# Patient Record
Sex: Female | Born: 1990 | Race: White | Hispanic: No | Marital: Married | Smoking: Never smoker
Health system: Southern US, Community
[De-identification: ages and names within clinical notes are randomized; demographics above are authoritative.]

## PROBLEM LIST (undated history)

## (undated) DIAGNOSIS — T8859XA Other complications of anesthesia, initial encounter: Secondary | ICD-10-CM

## (undated) DIAGNOSIS — Z789 Other specified health status: Secondary | ICD-10-CM

## (undated) HISTORY — DX: Other complications of anesthesia, initial encounter: T88.59XA

## (undated) HISTORY — PX: WISDOM TOOTH EXTRACTION: SHX21

## (undated) HISTORY — PX: ANTERIOR CRUCIATE LIGAMENT REPAIR: SHX115

## (undated) HISTORY — PX: APPENDECTOMY: SHX54

---

## 2016-11-20 ENCOUNTER — Ambulatory Visit (INDEPENDENT_AMBULATORY_CARE_PROVIDER_SITE_OTHER): Payer: Commercial Managed Care - PPO | Admitting: Certified Nurse Midwife

## 2016-11-20 ENCOUNTER — Encounter: Payer: Self-pay | Admitting: Certified Nurse Midwife

## 2016-11-20 VITALS — BP 127/81 | HR 95 | Ht 69.0 in | Wt 169.6 lb

## 2016-11-20 DIAGNOSIS — Z9189 Other specified personal risk factors, not elsewhere classified: Secondary | ICD-10-CM | POA: Insufficient documentation

## 2016-11-20 DIAGNOSIS — Z01419 Encounter for gynecological examination (general) (routine) without abnormal findings: Secondary | ICD-10-CM | POA: Diagnosis not present

## 2016-11-20 DIAGNOSIS — Z915 Personal history of self-harm: Secondary | ICD-10-CM | POA: Diagnosis not present

## 2016-11-20 DIAGNOSIS — IMO0002 Reserved for concepts with insufficient information to code with codable children: Secondary | ICD-10-CM

## 2016-11-20 DIAGNOSIS — Z8659 Personal history of other mental and behavioral disorders: Secondary | ICD-10-CM

## 2016-11-20 MED ORDER — CLONAZEPAM 0.25 MG PO TBDP: 0.2500 mg | ORAL_TABLET | Freq: Two times a day (BID) | ORAL | 0 refills | Status: DC

## 2016-11-20 MED ORDER — SERTRALINE HCL 50 MG PO TABS: 50.0000 mg | ORAL_TABLET | Freq: Every day | ORAL | 1 refills | Status: DC

## 2016-11-20 NOTE — Progress Notes (Signed)
ANNUAL PREVENTATIVE CARE GYN  ENCOUNTER NOTE  Subjective:       Regina Gray, "Regina Gray" is a 26 y.o. [id number] female here for a routine annual gynecologic exam.  Current complaints: increased panic attacks and change in mood.   Denies difficulty breathing or respiratory distress, chest pain, abdominal pain, unexplained vaginal bleeding, dysuria, and leg pain or swelling.   Denies suicidal or homicidal ideations.   Currently, unemployed. 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  Husband works for [employer] [place]. One cat, Regina Gray, and dog, Regina Gray.    Gynecologic History  Patient's last menstrual period was 11/11/2016.  Contraception: IUD, Skyla placed 12/2015  Last Pap: unsure. Results were: normal  Menstrual History  Period Cycle (Days): 30 Period Duration (Days): 10 Period Pattern: Regular Menstrual Flow: Light Menstrual Control: Other (Comment) (Thinx/Diva Cup) Dysmenorrhea: None   Obstetric History OB History  Gravida Para Term Preterm AB Living  1       1    SAB TAB Ectopic Multiple Live Births  1            # Outcome Date GA Lbr Len/2nd Weight Sex Delivery Anes PTL Lv  1 SAB 2014              History reviewed. No pertinent past medical history.  Past Surgical History:  Procedure Laterality Date  . ANTERIOR CRUCIATE LIGAMENT REPAIR    . APPENDECTOMY     Allergies  Allergen Reactions  . Codeine   . Sulfa Antibiotics     Social History   Social History  . Marital status: Married    Spouse name: N/A  . Number of children: N/A  . Years of education: N/A   Occupational History  . Not on file.   Social History Main Topics  . Smoking status: Never Smoker  . Smokeless tobacco: Never Used  . Alcohol use Yes     Comment: occas  . Drug use: No  . Sexual activity: Yes    Birth control/ protection: IUD     Comment: skyla   Other Topics Concern  . Not on file   Social History Narrative  . No narrative on file    Family History   Problem Relation Age of Onset  . Heart attack Maternal Grandfather     The following portions of the patient's history were reviewed and updated as appropriate: allergies, current medications, past family history, past medical history, past social history, past surgical history and problem list.  Review of Systems  ROS negative except as noted above. Information obtained from patient.    Objective:   BP 127/81   Pulse 95   Ht 5\' 9"  (1.753 m)   Wt 169 lb 9.6 oz (76.9 kg)   LMP 11/11/2016   BMI 25.05 kg/m   CONSTITUTIONAL: Well-developed, well-nourished female in no acute distress.   PSYCHIATRIC: Normal mood and affect. Normal behavior. Normal judgment and thought content.  NEUROLGIC: Alert and oriented to person, place, and time. Normal muscle tone coordination. No cranial nerve deficit noted.  HENT:  Normocephalic, atraumatic, External right and left ear normal. Oropharynx is clear and moist  EYES: Conjunctivae and EOM are normal. Pupils are equal, round, and reactive to light. No scleral icterus.   NECK: Normal range of motion, supple, no masses.  Normal thyroid.   SKIN: Skin is warm and dry. No rash noted. Not diaphoretic. No erythema. No pallor.  CARDIOVASCULAR: Normal heart rate noted, regular rhythm, no murmur.  RESPIRATORY: Clear  to auscultation bilaterally. Effort and breath sounds normal, no problems with respiration noted.  BREASTS: Symmetric in size. No masses, skin changes, nipple drainage, or lymphadenopathy.  ABDOMEN: Soft, normal bowel sounds, no distention noted.  No tenderness, rebound or guarding.   PELVIC:  External Genitalia: Normal  Vagina: Normal  Cervix: Normal  Uterus: Normal  Adnexa: Normal   MUSCULOSKELETAL: Normal range of motion. No tenderness.  No cyanosis, clubbing, or edema.  2+ distal pulses.  LYMPHATIC: No Axillary, Supraclavicular, or Inguinal Adenopathy.  Depression screen PHQ 2/9 11/20/2016  Decreased Interest 3  Down,  Depressed, Hopeless 3  PHQ - 2 Score 6  Altered sleeping 3  Tired, decreased energy 3  Change in appetite 3  Feeling bad or failure about yourself  3  Trouble concentrating 1  Moving slowly or fidgety/restless 1  Suicidal thoughts 0  PHQ-9 Score 20    Assessment:   Annual gynecologic examination 26 y.o.   Contraception: IUD   Overweight   Problem List Items Addressed This Visit      Other   History of anxiety   History of depression   History of intentional self-harm    Other Visit Diagnoses    Encounter for well woman exam with routine gynecological exam    -  Primary      Plan:   Pap: Pap, Reflex if ASCUS  Labs: Declined.   Rx: Zoloft and Klonopin, see orders.   Information given for Regina Gray, MSW, LCSW and encouraged to contact.   Routine preventative health maintenance measures emphasized: Exercise/Diet/Weight control, Tobacco Warnings, Alcohol/Substance use risks and Stress Management.   Reviewed red flag symptoms and when to call.   RTC x 4-6 weeks for medication check or sooner if needed.   Return to Clinic - 1 Year    Gunnar BullaJenkins Michelle Marvelous [place], CNM

## 2016-11-20 NOTE — Patient Instructions (Addendum)
Preventive Care 18-39 Years, Female Preventive care refers to lifestyle choices and visits with your health care provider that can promote health and wellness. What does preventive care include?  A yearly physical exam. This is also called an annual well check.  Dental exams once or twice a year.  Routine eye exams. Ask your health care provider how often you should have your eyes checked.  Personal lifestyle choices, including: ? Daily care of your teeth and gums. ? Regular physical activity. ? Eating a healthy diet. ? Avoiding tobacco and drug use. ? Limiting alcohol use. ? Practicing safe sex. ? Taking vitamin and mineral supplements as recommended by your health care provider. What happens during an annual well check? The services and screenings done by your health care provider during your annual well check will depend on your age, overall health, lifestyle risk factors, and family history of disease. Counseling Your health care provider may ask you questions about your:  Alcohol use.  Tobacco use.  Drug use.  Emotional well-being.  Home and relationship well-being.  Sexual activity.  Eating habits.  Work and work Statistician.  Method of birth control.  Menstrual cycle.  Pregnancy history.  Screening You may have the following tests or measurements:  Height, weight, and BMI.  Diabetes screening. This is done by checking your blood sugar (glucose) after you have not eaten for a while (fasting).  Blood pressure.  Lipid and cholesterol levels. These may be checked every 5 years starting at age 66.  Skin check.  Hepatitis C blood test.  Hepatitis B blood test.  Sexually transmitted disease (STD) testing.  BRCA-related cancer screening. This may be done if you have a family history of breast, ovarian, tubal, or peritoneal cancers.  Pelvic exam and Pap test. This may be done every 3 years starting at age 40. Starting at age 59, this may be done every 5  years if you have a Pap test in combination with an HPV test.  Discuss your test results, treatment options, and if necessary, the need for more tests with your health care provider. Vaccines Your health care provider may recommend certain vaccines, such as:  Influenza vaccine. This is recommended every year.  Tetanus, diphtheria, and acellular pertussis (Tdap, Td) vaccine. You may need a Td booster every 10 years.  Varicella vaccine. You may need this if you have not been vaccinated.  HPV vaccine. If you are 69 or younger, you may need three doses over 6 months.  Measles, mumps, and rubella (MMR) vaccine. You may need at least one dose of MMR. You may also need a second dose.  Pneumococcal 13-valent conjugate (PCV13) vaccine. You may need this if you have certain conditions and were not previously vaccinated.  Pneumococcal polysaccharide (PPSV23) vaccine. You may need one or two doses if you smoke cigarettes or if you have certain conditions.  Meningococcal vaccine. One dose is recommended if you are age 27-21 years and a first-year college student living in a residence hall, or if you have one of several medical conditions. You may also need additional booster doses.  Hepatitis A vaccine. You may need this if you have certain conditions or if you travel or work in places where you may be exposed to hepatitis A.  Hepatitis B vaccine. You may need this if you have certain conditions or if you travel or work in places where you may be exposed to hepatitis B.  Haemophilus influenzae type b (Hib) vaccine. You may need this if  you have certain risk factors.  Talk to your health care provider about which screenings and vaccines you need and how often you need them. This information is not intended to replace advice given to you by your health care provider. Make sure you discuss any questions you have with your health care provider. Document Released: 06/06/2001 Document Revised: 12/29/2015  Document Reviewed: 02/09/2015 Elsevier Interactive Patient Education  2017 [employer]. Panic Attacks Panic attacks are sudden, short-livedsurges of severe anxiety, fear, or discomfort. They may occur for no reason when you are relaxed, when you are anxious, or when you are sleeping. Panic attacks may occur for a number of reasons:  Healthy people occasionally have panic attacks in extreme, life-threatening situations, such as war or natural disasters. Normal anxiety is a protective mechanism of the body that helps [place] react to danger (fight or flight response).  Panic attacks are often seen with anxiety disorders, such as panic disorder, social anxiety disorder, generalized anxiety disorder, and phobias. Anxiety disorders cause excessive or uncontrollable anxiety. They may interfere with your relationships or other life activities.  Panic attacks are sometimes seen with other mental illnesses, such as depression and posttraumatic stress disorder.  Certain medical conditions, prescription medicines, and drugs of abuse can cause panic attacks.  What are the signs or symptoms? Panic attacks start suddenly, peak within 20 minutes, and are accompanied by four or more of the following symptoms:  Pounding heart or fast heart rate (palpitations).  Sweating.  Trembling or shaking.  Shortness of breath or feeling smothered.  Feeling choked.  Chest pain or discomfort.  Nausea or strange feeling in your stomach.  Dizziness, light-headedness, or feeling like you will faint.  Chills or hot flushes.  Numbness or tingling in your lips or hands and feet.  Feeling that things are not real or feeling that you are not yourself.  Fear of losing control or going crazy.  Fear of dying.  Some of these symptoms can mimic serious medical conditions. For example, you may think you are having a heart attack. Although panic attacks can be very scary, they are not life threatening. How is this  diagnosed? Panic attacks are diagnosed through an assessment by your health care provider. Your health care provider will ask questions about your symptoms, such as where and when they occurred. Your health care provider will also ask about your medical history and use of alcohol and drugs, including prescription medicines. Your health care provider may order blood tests or other studies to rule out a serious medical condition. Your health care provider may refer you to a mental health professional for further evaluation. How is this treated?  Most healthy people who have one or two panic attacks in an extreme, life-threatening situation will not require treatment.  The treatment for panic attacks associated with anxiety disorders or other mental illness typically involves counseling with a mental health professional, medicine, or a combination of both. Your health care provider will help determine what treatment is best for you.  Panic attacks due to physical illness usually go away with treatment of the illness. If prescription medicine is causing panic attacks, talk with your health care provider about stopping the medicine, decreasing the dose, or substituting another medicine.  Panic attacks due to alcohol or drug abuse go away with abstinence. Some adults need professional help in order to stop drinking or using drugs. Follow these instructions at home:  Take all medicines as directed by your health care provider.  Schedule  and attend follow-up visits as directed by your health care provider. It is important to keep all your appointments. Contact a health care provider if:  You are not able to take your medicines as prescribed.  Your symptoms do not improve or get worse. Get help right away if:  You experience panic attack symptoms that are different than your usual symptoms.  You have serious thoughts about hurting yourself or others.  You are taking medicine for panic attacks and  have a serious side effect. This information is not intended to replace advice given to you by your health care provider. Make sure you discuss any questions you have with your health care provider. Document Released: 04/10/2005 Document Revised: 09/16/2015 Document Reviewed: 11/22/2012 Elsevier Interactive Patient Education  2017 [employer]. Clonazepam tablets What is this medicine? CLONAZEPAM (kloe NA ze pam) is a benzodiazepine. It is used to treat certain types of seizures. It is also used to treat panic disorder. This medicine may be used for other purposes; ask your health care provider or pharmacist if you have questions. COMMON BRAND NAME(S): Ceberclon, Klonopin What should I tell my health care provider before I take this medicine? They need to know if you have any of these conditions: -an alcohol or drug abuse problem -bipolar disorder, depression, psychosis or other mental health condition -glaucoma -kidney or liver disease -lung or breathing disease -myasthenia gravis -Parkinson's disease -porphyria -seizures or a history of seizures -suicidal thoughts -an unusual or allergic reaction to clonazepam, other benzodiazepines, foods, dyes, or preservatives -pregnant or trying to get pregnant -breast-feeding How should I use this medicine? Take this medicine by mouth with a glass of water. Follow the directions on the prescription label. If it upsets your stomach, take it with food or milk. Take your medicine at regular intervals. Do not take it more often than directed. Do not stop taking or change the dose except on the advice of your doctor or health care professional. A special MedGuide will be given to you by the pharmacist with each prescription and refill. Be sure to read this information carefully each time. Talk to your pediatrician regarding the use of this medicine in children. Special care may be needed. Overdosage: If you think you have taken too much of this  medicine contact a poison control center or emergency room at once. NOTE: This medicine is only for you. Do not share this medicine with others. What if I miss a dose? If you miss a dose, take it as soon as you can. If it is almost time for your next dose, take only that dose. Do not take double or extra doses. What may interact with this medicine? Do not take this medication with any of the following medicines: -narcotic medicines for cough -sodium oxybate This medicine may also interact with the following medications: -alcohol -antihistamines for allergy, cough and cold -antiviral medicines for HIV or AIDS -certain medicines for anxiety or sleep -certain medicines for depression, like amitriptyline, fluoxetine, sertraline -certain medicines for fungal infections like ketoconazole and itraconazole -certain medicines for seizures like carbamazepine, phenobarbital, phenytoin, primidone -general anesthetics like halothane, isoflurane, methoxyflurane, propofol -local anesthetics like lidocaine, pramoxine, tetracaine -medicines that relax muscles for surgery -narcotic medicines for pain -phenothiazines like chlorpromazine, mesoridazine, prochlorperazine, thioridazine This list may not describe all possible interactions. Give your health care provider a list of all the medicines, herbs, non-prescription drugs, or dietary supplements you use. Also tell them if you smoke, drink alcohol, or use illegal drugs. Some  items may interact with your medicine. What should I watch for while using this medicine? Tell your doctor or health care professional if your symptoms do not start to get better or if they get worse. Do not stop taking except on your doctor's advice. You may develop a severe reaction. Your doctor will tell you how much medicine to take. You may get drowsy or dizzy. Do not drive, use machinery, or do anything that needs mental alertness until you know how this medicine affects you. To  reduce the risk of dizzy and fainting spells, do not stand or sit up quickly, especially if you are an older patient. Alcohol may increase dizziness and drowsiness. Avoid alcoholic drinks. If you are taking another medicine that also causes drowsiness, you may have more side effects. Give your health care provider a list of all medicines you use. Your doctor will tell you how much medicine to take. Do not take more medicine than directed. Call emergency for help if you have problems breathing or unusual sleepiness. The use of this medicine may increase the chance of suicidal thoughts or actions. Pay special attention to how you are responding while on this medicine. Any worsening of mood, or thoughts of suicide or dying should be reported to your health care professional right away. What side effects may I notice from receiving this medicine? Side effects that you should report to your doctor or health care professional as soon as possible: -allergic reactions like skin rash, itching or hives, swelling of the face, lips, or tongue -breathing problems -confusion -loss of balance or coordination -signs and symptoms of low blood pressure like dizziness; feeling faint or lightheaded, falls; unusually weak or tired -suicidal thoughts or mood changes Side effects that usually do not require medical attention (report to your doctor or health care professional if they continue or are bothersome): -dizziness -headache -tiredness -upset stomach This list may not describe all possible side effects. Call your doctor for medical advice about side effects. You may report side effects to FDA at 1-800-FDA-1088. Where should I keep my medicine? Keep out of the reach of children. This medicine can be abused. Keep your medicine in a safe place to protect it from theft. Do not share this medicine with anyone. Selling or giving away this medicine is dangerous and against the law. This medicine may cause accidental  overdose and death if taken by other adults, children, or pets. Mix any unused medicine with a substance like cat litter or coffee grounds. Then throw the medicine away in a sealed container like a sealed bag or a coffee can with a lid. Do not use the medicine after the expiration date. Store at room temperature between 15 and 30 degrees C (59 and 86 degrees F). Protect from light. Keep container tightly closed. NOTE: This sheet is a summary. It may not cover all possible information. If you have questions about this medicine, talk to your doctor, pharmacist, or health care provider.  2018 Elsevier/Gold Standard (2015-09-17 18:46:32) Sertraline tablets What is this medicine? SERTRALINE (SER tra leen) is used to treat depression. It may also be used to treat obsessive compulsive disorder, panic disorder, post-trauma stress, premenstrual dysphoric disorder (PMDD) or social anxiety. This medicine may be used for other purposes; ask your health care provider or pharmacist if you have questions. COMMON BRAND NAME(S): Zoloft What should I tell my health care provider before I take this medicine? They need to know if you have any of these conditions: -bleeding  disorders -bipolar disorder or a family history of bipolar disorder -glaucoma -heart disease -high blood pressure -history of irregular heartbeat -history of low levels of calcium, magnesium, or potassium in the blood -if you often drink alcohol -liver disease -receiving electroconvulsive therapy -seizures -suicidal thoughts, plans, or attempt; a previous suicide attempt by you or a family member -take medicines that treat or prevent blood clots -thyroid disease -an unusual or allergic reaction to sertraline, other medicines, foods, dyes, or preservatives -pregnant or trying to get pregnant -breast-feeding How should I use this medicine? Take this medicine by mouth with a glass of water. Follow the directions on the prescription label.  You can take it with or without food. Take your medicine at regular intervals. Do not take your medicine more often than directed. Do not stop taking this medicine suddenly except upon the advice of your doctor. Stopping this medicine too quickly may cause serious side effects or your condition may worsen. A special MedGuide will be given to you by the pharmacist with each prescription and refill. Be sure to read this information carefully each time. Talk to your pediatrician regarding the use of this medicine in children. While this drug may be prescribed for children as young as 7 years for selected conditions, precautions do apply. Overdosage: If you think you have taken too much of this medicine contact a poison control center or emergency room at once. NOTE: This medicine is only for you. Do not share this medicine with others. What if I miss a dose? If you miss a dose, take it as soon as you can. If it is almost time for your next dose, take only that dose. Do not take double or extra doses. What may interact with this medicine? Do not take this medicine with any of the following medications: -cisapride -dofetilide -dronedarone -linezolid -MAOIs like Carbex, Eldepryl, Marplan, Nardil, and Parnate -methylene blue (injected into a vein) -pimozide -thioridazine This medicine may also interact with the following medications: -alcohol -amphetamines -aspirin and aspirin-like medicines -certain medicines for depression, anxiety, or psychotic disturbances -certain medicines for fungal infections like ketoconazole, fluconazole, posaconazole, and itraconazole -certain medicines for irregular heart beat like flecainide, quinidine, propafenone -certain medicines for migraine headaches like almotriptan, eletriptan, frovatriptan, naratriptan, rizatriptan, sumatriptan, zolmitriptan -certain medicines for sleep -certain medicines for seizures like carbamazepine, valproic acid, phenytoin -certain  medicines that treat or prevent blood clots like warfarin, enoxaparin, dalteparin -cimetidine -digoxin -diuretics -fentanyl -isoniazid -lithium -NSAIDs, medicines for pain and inflammation, like ibuprofen or naproxen -other medicines that prolong the QT interval (cause an abnormal heart rhythm) -rasagiline -safinamide -supplements like St. John's wort, kava kava, valerian -tolbutamide -tramadol -tryptophan This list may not describe all possible interactions. Give your health care provider a list of all the medicines, herbs, non-prescription drugs, or dietary supplements you use. Also tell them if you smoke, drink alcohol, or use illegal drugs. Some items may interact with your medicine. What should I watch for while using this medicine? Tell your doctor if your symptoms do not get better or if they get worse. Visit your doctor or health care professional for regular checks on your progress. Because it may take several weeks to see the full effects of this medicine, it is important to continue your treatment as prescribed by your doctor. Patients and their families should watch out for new or worsening thoughts of suicide or depression. Also watch out for sudden changes in feelings such as feeling anxious, agitated, panicky, irritable, hostile, aggressive, impulsive, severely restless,  overly excited and hyperactive, or not being able to sleep. If this happens, especially at the beginning of treatment or after a change in dose, call your health care professional. Dennis Bast may get drowsy or dizzy. Do not drive, use machinery, or do anything that needs mental alertness until you know how this medicine affects you. Do not stand or sit up quickly, especially if you are an older patient. This reduces the risk of dizzy or fainting spells. Alcohol may interfere with the effect of this medicine. Avoid alcoholic drinks. Your mouth may get dry. Chewing sugarless gum or sucking hard candy, and drinking plenty of  water may help. Contact your doctor if the problem does not go away or is severe. What side effects may I notice from receiving this medicine? Side effects that you should report to your doctor or health care professional as soon as possible: -allergic reactions like skin rash, itching or hives, swelling of the face, lips, or tongue -anxious -black, tarry stools -changes in vision -confusion -elevated mood, decreased need for sleep, racing thoughts, impulsive behavior -eye pain -fast, irregular heartbeat -feeling faint or lightheaded, falls -feeling agitated, angry, or irritable -hallucination, loss of contact with reality -loss of balance or coordination -loss of memory -painful or prolonged erections -restlessness, pacing, inability to keep still -seizures -stiff muscles -suicidal thoughts or other mood changes -trouble sleeping -unusual bleeding or bruising -unusually weak or tired -vomiting Side effects that usually do not require medical attention (report to your doctor or health care professional if they continue or are bothersome): -change in appetite or weight -change in sex drive or performance -diarrhea -increased sweating -indigestion, nausea -tremors This list may not describe all possible side effects. Call your doctor for medical advice about side effects. You may report side effects to FDA at 1-800-FDA-1088. Where should I keep my medicine? Keep out of the reach of children. Store at room temperature between 15 and 30 degrees C (59 and 86 degrees F). Throw away any unused medicine after the expiration date. NOTE: This sheet is a summary. It may not cover all possible information. If you have questions about this medicine, talk to your doctor, pharmacist, or health care provider.  2018 Elsevier/Gold Standard (2016-04-14 14:17:49)

## 2016-11-23 LAB — PAP IG W/ RFLX HPV ASCU: PAP Smear Comment: 0

## 2017-01-01 ENCOUNTER — Encounter: Payer: Commercial Managed Care - PPO | Admitting: Certified Nurse Midwife

## 2017-01-08 ENCOUNTER — Ambulatory Visit (INDEPENDENT_AMBULATORY_CARE_PROVIDER_SITE_OTHER): Payer: Commercial Managed Care - PPO | Admitting: Certified Nurse Midwife

## 2017-01-08 ENCOUNTER — Encounter: Payer: Self-pay | Admitting: Certified Nurse Midwife

## 2017-01-08 VITALS — BP 122/80 | HR 63 | Ht 69.0 in | Wt 169.0 lb

## 2017-01-08 DIAGNOSIS — Z09 Encounter for follow-up examination after completed treatment for conditions other than malignant neoplasm: Secondary | ICD-10-CM

## 2017-01-08 DIAGNOSIS — Z79899 Other long term (current) drug therapy: Secondary | ICD-10-CM | POA: Diagnosis not present

## 2017-01-08 MED ORDER — SERTRALINE HCL 50 MG PO TABS: 50.0000 mg | ORAL_TABLET | Freq: Every day | ORAL | 5 refills | Status: DC

## 2017-01-08 MED ORDER — CLONAZEPAM 0.25 MG PO TBDP: 0.2500 mg | ORAL_TABLET | Freq: Two times a day (BID) | ORAL | 1 refills | Status: DC

## 2017-01-08 NOTE — Patient Instructions (Signed)
Clonazepam tablets What is this medicine? CLONAZEPAM (kloe NA ze pam) is a benzodiazepine. It is used to treat certain types of seizures. It is also used to treat panic disorder. This medicine may be used for other purposes; ask your health care provider or pharmacist if you have questions. COMMON BRAND NAME(S): Ceberclon, Klonopin What should I tell my health care provider before I take this medicine? They need to know if you have any of these conditions: -an alcohol or drug abuse problem -bipolar disorder, depression, psychosis or other mental health condition -glaucoma -kidney or liver disease -lung or breathing disease -myasthenia gravis -Parkinson's disease -porphyria -seizures or a history of seizures -suicidal thoughts -an unusual or allergic reaction to clonazepam, other benzodiazepines, foods, dyes, or preservatives -pregnant or trying to get pregnant -breast-feeding How should I use this medicine? Take this medicine by mouth with a glass of water. Follow the directions on the prescription label. If it upsets your stomach, take it with food or milk. Take your medicine at regular intervals. Do not take it more often than directed. Do not stop taking or change the dose except on the advice of your doctor or health care professional. A special MedGuide will be given to you by the pharmacist with each prescription and refill. Be sure to read this information carefully each time. Talk to your pediatrician regarding the use of this medicine in children. Special care may be needed. Overdosage: If you think you have taken too much of this medicine contact a poison control center or emergency room at once. NOTE: This medicine is only for you. Do not share this medicine with others. What if I miss a dose? If you miss a dose, take it as soon as you can. If it is almost time for your next dose, take only that dose. Do not take double or extra doses. What may interact with this medicine? Do  not take this medication with any of the following medicines: -narcotic medicines for cough -sodium oxybate This medicine may also interact with the following medications: -alcohol -antihistamines for allergy, cough and cold -antiviral medicines for HIV or AIDS -certain medicines for anxiety or sleep -certain medicines for depression, like amitriptyline, fluoxetine, sertraline -certain medicines for fungal infections like ketoconazole and itraconazole -certain medicines for seizures like carbamazepine, phenobarbital, phenytoin, primidone -general anesthetics like halothane, isoflurane, methoxyflurane, propofol -local anesthetics like lidocaine, pramoxine, tetracaine -medicines that relax muscles for surgery -narcotic medicines for pain -phenothiazines like chlorpromazine, mesoridazine, prochlorperazine, thioridazine This list may not describe all possible interactions. Give your health care provider a list of all the medicines, herbs, non-prescription drugs, or dietary supplements you use. Also tell them if you smoke, drink alcohol, or use illegal drugs. Some items may interact with your medicine. What should I watch for while using this medicine? Tell your doctor or health care professional if your symptoms do not start to get better or if they get worse. Do not stop taking except on your doctor's advice. You may develop a severe reaction. Your doctor will tell you how much medicine to take. You may get drowsy or dizzy. Do not drive, use machinery, or do anything that needs mental alertness until you know how this medicine affects you. To reduce the risk of dizzy and fainting spells, do not stand or sit up quickly, especially if you are an older patient. Alcohol may increase dizziness and drowsiness. Avoid alcoholic drinks. If you are taking another medicine that also causes drowsiness, you may have more side   effects. Give your health care provider a list of all medicines you use. Your doctor  will tell you how much medicine to take. Do not take more medicine than directed. Call emergency for help if you have problems breathing or unusual sleepiness. The use of this medicine may increase the chance of suicidal thoughts or actions. Pay special attention to how you are responding while on this medicine. Any worsening of mood, or thoughts of suicide or dying should be reported to your health care professional right away. What side effects may I notice from receiving this medicine? Side effects that you should report to your doctor or health care professional as soon as possible: -allergic reactions like skin rash, itching or hives, swelling of the face, lips, or tongue -breathing problems -confusion -loss of balance or coordination -signs and symptoms of low blood pressure like dizziness; feeling faint or lightheaded, falls; unusually weak or tired -suicidal thoughts or mood changes Side effects that usually do not require medical attention (report to your doctor or health care professional if they continue or are bothersome): -dizziness -headache -tiredness -upset stomach This list may not describe all possible side effects. Call your doctor for medical advice about side effects. You may report side effects to FDA at 1-800-FDA-1088. Where should I keep my medicine? Keep out of the reach of children. This medicine can be abused. Keep your medicine in a safe place to protect it from theft. Do not share this medicine with anyone. Selling or giving away this medicine is dangerous and against the law. This medicine may cause accidental overdose and death if taken by other adults, children, or pets. Mix any unused medicine with a substance like cat litter or coffee grounds. Then throw the medicine away in a sealed container like a sealed bag or a coffee can with a lid. Do not use the medicine after the expiration date. Store at room temperature between 15 and 30 degrees C (59 and 86 degrees F).  Protect from light. Keep container tightly closed. NOTE: This sheet is a summary. It may not cover all possible information. If you have questions about this medicine, talk to your doctor, pharmacist, or health care provider.  2018 Elsevier/Gold Standard (2015-09-17 18:46:32) Sertraline tablets What is this medicine? SERTRALINE (SER tra leen) is used to treat depression. It may also be used to treat obsessive compulsive disorder, panic disorder, post-trauma stress, premenstrual dysphoric disorder (PMDD) or social anxiety. This medicine may be used for other purposes; ask your health care provider or pharmacist if you have questions. COMMON BRAND NAME(S): Zoloft What should I tell my health care provider before I take this medicine? They need to know if you have any of these conditions: -bleeding disorders -bipolar disorder or a family history of bipolar disorder -glaucoma -heart disease -high blood pressure -history of irregular heartbeat -history of low levels of calcium, magnesium, or potassium in the blood -if you often drink alcohol -liver disease -receiving electroconvulsive therapy -seizures -suicidal thoughts, plans, or attempt; a previous suicide attempt by you or a family member -take medicines that treat or prevent blood clots -thyroid disease -an unusual or allergic reaction to sertraline, other medicines, foods, dyes, or preservatives -pregnant or trying to get pregnant -breast-feeding How should I use this medicine? Take this medicine by mouth with a glass of water. Follow the directions on the prescription label. You can take it with or without food. Take your medicine at regular intervals. Do not take your medicine more often than directed.  Do not stop taking this medicine suddenly except upon the advice of your doctor. Stopping this medicine too quickly may cause serious side effects or your condition may worsen. A special MedGuide will be given to you by the pharmacist  with each prescription and refill. Be sure to read this information carefully each time. Talk to your pediatrician regarding the use of this medicine in children. While this drug may be prescribed for children as young as 7 years for selected conditions, precautions do apply. Overdosage: If you think you have taken too much of this medicine contact a poison control center or emergency room at once. NOTE: This medicine is only for you. Do not share this medicine with others. What if I miss a dose? If you miss a dose, take it as soon as you can. If it is almost time for your next dose, take only that dose. Do not take double or extra doses. What may interact with this medicine? Do not take this medicine with any of the following medications: -cisapride -dofetilide -dronedarone -linezolid -MAOIs like Carbex, Eldepryl, Marplan, Nardil, and Parnate -methylene blue (injected into a vein) -pimozide -thioridazine This medicine may also interact with the following medications: -alcohol -amphetamines -aspirin and aspirin-like medicines -certain medicines for depression, anxiety, or psychotic disturbances -certain medicines for fungal infections like ketoconazole, fluconazole, posaconazole, and itraconazole -certain medicines for irregular heart beat like flecainide, quinidine, propafenone -certain medicines for migraine headaches like almotriptan, eletriptan, frovatriptan, naratriptan, rizatriptan, sumatriptan, zolmitriptan -certain medicines for sleep -certain medicines for seizures like carbamazepine, valproic acid, phenytoin -certain medicines that treat or prevent blood clots like warfarin, enoxaparin, dalteparin -cimetidine -digoxin -diuretics -fentanyl -isoniazid -lithium -NSAIDs, medicines for pain and inflammation, like ibuprofen or naproxen -other medicines that prolong the QT interval (cause an abnormal heart rhythm) -rasagiline -safinamide -supplements like St. John's wort, kava  kava, valerian -tolbutamide -tramadol -tryptophan This list may not describe all possible interactions. Give your health care provider a list of all the medicines, herbs, non-prescription drugs, or dietary supplements you use. Also tell them if you smoke, drink alcohol, or use illegal drugs. Some items may interact with your medicine. What should I watch for while using this medicine? Tell your doctor if your symptoms do not get better or if they get worse. Visit your doctor or health care professional for regular checks on your progress. Because it may take several weeks to see the full effects of this medicine, it is important to continue your treatment as prescribed by your doctor. Patients and their families should watch out for new or worsening thoughts of suicide or depression. Also watch out for sudden changes in feelings such as feeling anxious, agitated, panicky, irritable, hostile, aggressive, impulsive, severely restless, overly excited and hyperactive, or not being able to sleep. If this happens, especially at the beginning of treatment or after a change in dose, call your health care professional. Bonita Quin may get drowsy or dizzy. Do not drive, use machinery, or do anything that needs mental alertness until you know how this medicine affects you. Do not stand or sit up quickly, especially if you are an older patient. This reduces the risk of dizzy or fainting spells. Alcohol may interfere with the effect of this medicine. Avoid alcoholic drinks. Your mouth may get dry. Chewing sugarless gum or sucking hard candy, and drinking plenty of water may help. Contact your doctor if the problem does not go away or is severe. What side effects may I notice from receiving this medicine? Side  effects that you should report to your doctor or health care professional as soon as possible: -allergic reactions like skin rash, itching or hives, swelling of the face, lips, or tongue -anxious -black, tarry  stools -changes in vision -confusion -elevated mood, decreased need for sleep, racing thoughts, impulsive behavior -eye pain -fast, irregular heartbeat -feeling faint or lightheaded, falls -feeling agitated, angry, or irritable -hallucination, loss of contact with reality -loss of balance or coordination -loss of memory -painful or prolonged erections -restlessness, pacing, inability to keep still -seizures -stiff muscles -suicidal thoughts or other mood changes -trouble sleeping -unusual bleeding or bruising -unusually weak or tired -vomiting Side effects that usually do not require medical attention (report to your doctor or health care professional if they continue or are bothersome): -change in appetite or weight -change in sex drive or performance -diarrhea -increased sweating -indigestion, nausea -tremors This list may not describe all possible side effects. Call your doctor for medical advice about side effects. You may report side effects to FDA at 1-800-FDA-1088. Where should I keep my medicine? Keep out of the reach of children. Store at room temperature between 15 and 30 degrees C (59 and 86 degrees F). Throw away any unused medicine after the expiration date. NOTE: This sheet is a summary. It may not cover all possible information. If you have questions about this medicine, talk to your doctor, pharmacist, or health care provider.  2018 Elsevier/Gold Standard (2016-04-14 14:17:49)

## 2017-01-08 NOTE — Progress Notes (Signed)
GYN ENCOUNTER NOTE  Subjective:       Regina Gray is a 26 y.o. [id number] female is here for follow up. Last seen on 11/20/2016 for annual exam and positive for symptoms of major depressive episode. PHQ-9: 20.  Reports feeling much better. Taking Zoloft daily and Klonopin as needed. Took Klonopin for a few nights in a row to sleep, but is not making that a habit.   Has been up around house completing tasks like laundry and cleaning. Recently went out of town with spouse.   Endorses weight gain, breast tenderness, and vaginal spotting after recent knee surgery.   Denies difficulty breathing or respiratory distress, chest pain, abdominal pain, excessive vaginal bleeding, dysuria, and leg pain or swelling.    Gynecologic History  No LMP recorded. Patient is not currently having periods (Reason: IUD).  Contraception: IUD, Christean Grief  Last Pap: 11/20/2016. Results were: normal  Obstetric History  OB History  Gravida Para Term Preterm AB Living  1       1    SAB TAB Ectopic Multiple Live Births  1            # Outcome Date GA Lbr Len/2nd Weight Sex Delivery Anes PTL Lv  1 SAB 2014              History reviewed. No pertinent past medical history.  Past Surgical History:  Procedure Laterality Date  . ANTERIOR CRUCIATE LIGAMENT REPAIR    . APPENDECTOMY      Current Outpatient Prescriptions on File Prior to Visit  Medication Sig Dispense Refill  . Levonorgestrel (SKYLA) 13.5 MG IUD by Intrauterine route.     No current facility-administered medications on file prior to visit.     Allergies  Allergen Reactions  . Codeine   . Sulfa Antibiotics     Social History   Social History  . Marital status: Married    Spouse name: N/A  . Number of children: N/A  . Years of education: N/A   Occupational History  . Not on file.   Social History Main Topics  . Smoking status: Never Smoker  . Smokeless tobacco: Never Used  . Alcohol use Yes     Comment: occas  . Drug  use: No  . Sexual activity: Yes    Birth control/ protection: IUD     Comment: skyla   Other Topics Concern  . Not on file   Social History Narrative  . No narrative on file    Family History  Problem Relation Age of Onset  . Heart attack Maternal Grandfather     The following portions of the patient's history were reviewed and updated as appropriate: allergies, current medications, past family history, past medical history, past social history, past surgical history and problem list.  Review of Systems  Review of Systems - Negative except as noted above.  History obtained from the patient.   Objective:   BP 122/80   Pulse 63   Ht  (1.753 m)   Wt 169 lb (76.7 kg)   BMI 24.96 kg/m   Alert and oriented x 4, no apparent distress.  Depression screen [facility] 2/9 01/08/2017  Decreased Interest 0  Down, Depressed, Hopeless 1  PHQ - 2 Score 1  Altered sleeping 3  Tired, decreased energy 1  Change in appetite 3  Feeling bad or failure about yourself  1  Trouble concentrating 0  Moving slowly or fidgety/restless 0  Suicidal thoughts 0  PHQ-9 Score  9   Assessment:   1. Follow up  2. Medication management  Plan:   Continue medications as prescribed, see orders.   Try Melatonin for sleep as needed.   Encouraged cycle tracking, symptom monitoring, and bra fitting.  Reviewed red flag symptoms and when to call.   RTC x 6 months for follow up or sooner if needed.    Gunnar Bulla, CNM

## 2017-01-19 ENCOUNTER — Encounter: Payer: Self-pay | Admitting: Physician Assistant

## 2017-01-19 ENCOUNTER — Ambulatory Visit (INDEPENDENT_AMBULATORY_CARE_PROVIDER_SITE_OTHER): Payer: Commercial Managed Care - PPO | Admitting: Physician Assistant

## 2017-01-19 VITALS — BP 104/62 | HR 76 | Temp 98.2°F | Resp 16 | Ht 69.0 in | Wt 171.0 lb

## 2017-01-19 DIAGNOSIS — M62838 Other muscle spasm: Secondary | ICD-10-CM | POA: Diagnosis not present

## 2017-01-19 DIAGNOSIS — Z0000 Encounter for general adult medical examination without abnormal findings: Secondary | ICD-10-CM | POA: Diagnosis not present

## 2017-01-19 MED ORDER — CYCLOBENZAPRINE HCL 10 MG PO TABS: 10.0000 mg | ORAL_TABLET | Freq: Every day | ORAL | 0 refills | Status: DC
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
## 2017-01-19 NOTE — Patient Instructions (Signed)

## 2017-02-22 ENCOUNTER — Encounter: Payer: Self-pay | Admitting: Physician Assistant

## 2017-03-05 ENCOUNTER — Encounter: Payer: Self-pay | Admitting: Physician Assistant

## 2017-03-08 ENCOUNTER — Other Ambulatory Visit: Payer: Self-pay | Admitting: Physician Assistant

## 2017-03-08 DIAGNOSIS — M542 Cervicalgia: Secondary | ICD-10-CM
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
## 2017-03-20 ENCOUNTER — Encounter: Payer: Commercial Managed Care - PPO | Admitting: Physical Therapy

## 2017-03-21 ENCOUNTER — Ambulatory Visit: Payer: Commercial Managed Care - PPO | Attending: Physician Assistant | Admitting: Physical Therapy

## 2017-03-21 DIAGNOSIS — G8929 Other chronic pain: Secondary | ICD-10-CM | POA: Diagnosis present

## 2017-03-21 DIAGNOSIS — M25512 Pain in left shoulder: Secondary | ICD-10-CM | POA: Insufficient documentation

## 2017-03-21 DIAGNOSIS — M542 Cervicalgia: Secondary | ICD-10-CM | POA: Diagnosis present

## 2017-03-21 NOTE — Patient Instructions (Signed)
MMT - mild pain reproduced with shoulder abduction on LUE  IR on L - 81 degrees, ER -- 83 degrees
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
## 2017-03-23 ENCOUNTER — Ambulatory Visit: Payer: Commercial Managed Care - PPO | Admitting: Physical Therapy

## 2017-03-23 DIAGNOSIS — M25512 Pain in left shoulder: Principal | ICD-10-CM

## 2017-03-23 DIAGNOSIS — G8929 Other chronic pain: Secondary | ICD-10-CM

## 2017-03-23 DIAGNOSIS — M542 Cervicalgia: Secondary | ICD-10-CM
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
## 2017-03-27 ENCOUNTER — Ambulatory Visit: Payer: Commercial Managed Care - PPO | Admitting: Physical Therapy

## 2017-03-28 ENCOUNTER — Ambulatory Visit: Payer: Commercial Managed Care - PPO | Attending: Physician Assistant | Admitting: Physical Therapy

## 2017-03-28 DIAGNOSIS — M542 Cervicalgia: Secondary | ICD-10-CM | POA: Diagnosis present

## 2017-03-28 DIAGNOSIS — M25512 Pain in left shoulder: Secondary | ICD-10-CM | POA: Diagnosis not present

## 2017-03-28 DIAGNOSIS — G8929 Other chronic pain: Secondary | ICD-10-CM | POA: Insufficient documentation
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
## 2017-04-05 ENCOUNTER — Ambulatory Visit: Payer: Commercial Managed Care - PPO | Admitting: Physical Therapy

## 2017-04-05 DIAGNOSIS — M25512 Pain in left shoulder: Principal | ICD-10-CM

## 2017-04-05 DIAGNOSIS — G8929 Other chronic pain: Secondary | ICD-10-CM

## 2017-04-05 DIAGNOSIS — M542 Cervicalgia: Secondary | ICD-10-CM
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
## 2017-06-05 ENCOUNTER — Encounter: Payer: Self-pay | Admitting: Certified Nurse Midwife

## 2017-06-14 ENCOUNTER — Encounter: Payer: Self-pay | Admitting: Physician Assistant

## 2017-07-09 ENCOUNTER — Encounter: Payer: Commercial Managed Care - PPO | Admitting: Certified Nurse Midwife

## 2017-07-16 ENCOUNTER — Ambulatory Visit (INDEPENDENT_AMBULATORY_CARE_PROVIDER_SITE_OTHER): Payer: Commercial Managed Care - PPO | Admitting: Certified Nurse Midwife

## 2017-07-16 ENCOUNTER — Encounter: Payer: Self-pay | Admitting: Certified Nurse Midwife

## 2017-07-16 VITALS — BP 131/81 | HR 73 | Ht 69.0 in | Wt 172.9 lb

## 2017-07-16 DIAGNOSIS — Z30432 Encounter for removal of intrauterine contraceptive device: Secondary | ICD-10-CM | POA: Diagnosis not present

## 2017-07-16 NOTE — Patient Instructions (Signed)
Lipoma A lipoma is a noncancerous (benign) tumor that is made up of fat cells. This is a very common type of soft-tissue growth. Lipomas are usually found under the skin (subcutaneous). They may occur in any tissue of the body that contains fat. Common areas for lipomas to appear include the back, shoulders, buttocks, and thighs. Lipomas grow slowly, and they are usually painless. Most lipomas do not cause problems and do not require treatment. What are the causes? The cause of this condition is not known. What increases the risk? This condition is more likely to develop in:  People who are 31-27 years old.  People who have a family history of lipomas.  What are the signs or symptoms? A lipoma usually appears as a small, round bump under the skin. It may feel soft or rubbery, but the firmness can vary. Most lipomas are not painful. However, a lipoma may become painful if it is located in an area where it pushes on nerves. How is this diagnosed? A lipoma can usually be diagnosed with a physical exam. You may also have tests to confirm the diagnosis and to rule out other conditions. Tests may include:  Imaging tests, such as a CT scan or MRI.  Removal of a tissue sample to be looked at under a microscope (biopsy).  How is this treated? Treatment is not needed for small lipomas that are not causing problems. If a lipoma continues to get bigger or it causes problems, removal is often the best option. Lipomas can also be removed to improve appearance. Removal of a lipoma is usually done with a surgery in which the fatty cells and the surrounding capsule are removed. Most often, a medicine that numbs the area (local anesthetic) is used for this procedure. Follow these instructions at home:  Keep all follow-up visits as directed by your health care provider. This is important. Contact a health care provider if:  Your lipoma becomes larger or hard.  Your lipoma becomes painful, red, or  increasingly swollen. These could be signs of infection or a more serious condition. This information is not intended to replace advice given to you by your health care provider. Make sure you discuss any questions you have with your health care provider. Document Released: 03/31/2002 Document Revised: 09/16/2015 Document Reviewed: 04/06/2014 Elsevier Interactive Patient Education  2018 [place] 18-39 Years, Female Preventive care refers to lifestyle choices and visits with your health care provider that can promote health and wellness. What does preventive care include?  A yearly physical exam. This is also called an annual well check.  Dental exams once or twice a year.  Routine eye exams. Ask your health care provider how often you should have your eyes checked.  Personal lifestyle choices, including: ? Daily care of your teeth and gums. ? Regular physical activity. ? Eating a healthy diet. ? Avoiding tobacco and drug use. ? Limiting alcohol use. ? Practicing safe sex. ? Taking vitamin and mineral supplements as recommended by your health care provider. What happens during an annual well check? The services and screenings done by your health care provider during your annual well check will depend on your age, overall health, lifestyle risk factors, and family history of disease. Counseling Your health care provider may ask you questions about your:  Alcohol use.  Tobacco use.  Drug use.  Emotional well-being.  Home and relationship well-being.  Sexual activity.  Eating habits.  Work and work Statistician.  Method of birth control.  Menstrual cycle.  Pregnancy history.  Screening You may have the following tests or measurements:  Height, weight, and BMI.  Diabetes screening. This is done by checking your blood sugar (glucose) after you have not eaten for a while (fasting).  Blood pressure.  Lipid and cholesterol levels. These may be  checked every 5 years starting at age 47.  Skin check.  Hepatitis C blood test.  Hepatitis B blood test.  Sexually transmitted disease (STD) testing.  BRCA-related cancer screening. This may be done if you have a family history of breast, ovarian, tubal, or peritoneal cancers.  Pelvic exam and Pap test. This may be done every 3 years starting at age 87. Starting at age 41, this may be done every 5 years if you have a Pap test in combination with an HPV test.  Discuss your test results, treatment options, and if necessary, the need for more tests with your health care provider. Vaccines Your health care provider may recommend certain vaccines, such as:  Influenza vaccine. This is recommended every year.  Tetanus, diphtheria, and acellular pertussis (Tdap, Td) vaccine. You may need a Td booster every 10 years.  Varicella vaccine. You may need this if you have not been vaccinated.  HPV vaccine. If you are 90 or younger, you may need three doses over 6 months.  Measles, mumps, and rubella (MMR) vaccine. You may need at least one dose of MMR. You may also need a second dose.  Pneumococcal 13-valent conjugate (PCV13) vaccine. You may need this if you have certain conditions and were not previously vaccinated.  Pneumococcal polysaccharide (PPSV23) vaccine. You may need one or two doses if you smoke cigarettes or if you have certain conditions.  Meningococcal vaccine. One dose is recommended if you are age 33-21 years and a first-year college student living in a residence hall, or if you have one of several medical conditions. You may also need additional booster doses.  Hepatitis A vaccine. You may need this if you have certain conditions or if you travel or work in places where you may be exposed to hepatitis A.  Hepatitis B vaccine. You may need this if you have certain conditions or if you travel or work in places where you may be exposed to hepatitis B.  Haemophilus influenzae type  b (Hib) vaccine. You may need this if you have certain risk factors.  Talk to your health care provider about which screenings and vaccines you need and how often you need them. This information is not intended to replace advice given to you by your health care provider. Make sure you discuss any questions you have with your health care provider. Document Released: 06/06/2001 Document Revised: 12/29/2015 Document Reviewed: 02/09/2015 Elsevier Interactive Patient Education  [employer].

## 2017-07-18 NOTE — Progress Notes (Signed)
Regina Gray is a 27 y.o. year old [id number] Caucasian female who presents for removal of a Skyla IUD. Her Skyla IUD was placed 12/2015. She reports improving mood without medication and desires removal of Skyla to help with mood management.    BP 131/81   Pulse 73   Ht 5\' 9"  (1.753 m)   Wt 172 lb 14.4 oz (78.4 kg)   LMP 07/11/2017   BMI 25.53 kg/m   [facility] 07/16/2017  Decreased Interest 0  Down, Depressed, Hopeless 0  PHQ - 2 Score 0  Altered sleeping 0  Tired, decreased energy 0  Change in appetite 0  Feeling bad or failure about yourself  0  Trouble concentrating 0  Moving slowly or fidgety/restless 0  Suicidal thoughts 0  PHQ-9 Score 0    Time out was performed.  A small plastic speculum was placed in the vagina.  The cervix was visualized, and the strings were visible. They were grasped and the Mirena was easily removed intact without complications.   Reviewed red flag symptoms and when to call.   RTC x 6 months for Annual exam or sooner if needed.    Regina Gray, CNM [facility]

## 2017-10-26 ENCOUNTER — Telehealth: Payer: Self-pay | Admitting: Certified Nurse Midwife

## 2017-10-26 NOTE — Telephone Encounter (Signed)
The patient called and stated that she needs something sent in because she has a UTI. The patient is hoping to have something sent in before the weekend for relief. Please advise.

## 2017-11-06 ENCOUNTER — Encounter: Payer: Commercial Managed Care - PPO | Admitting: Certified Nurse Midwife

## 2017-11-22 ENCOUNTER — Encounter: Payer: Commercial Managed Care - PPO | Admitting: Certified Nurse Midwife

## 2017-11-29 ENCOUNTER — Encounter: Payer: Self-pay | Admitting: Certified Nurse Midwife

## 2017-11-29 ENCOUNTER — Ambulatory Visit (INDEPENDENT_AMBULATORY_CARE_PROVIDER_SITE_OTHER): Payer: Commercial Managed Care - PPO | Admitting: Certified Nurse Midwife

## 2017-11-29 ENCOUNTER — Encounter: Payer: Commercial Managed Care - PPO | Admitting: Certified Nurse Midwife

## 2017-11-29 VITALS — BP 126/83 | HR 79 | Ht 69.0 in | Wt 179.2 lb

## 2017-11-29 DIAGNOSIS — Z01419 Encounter for gynecological examination (general) (routine) without abnormal findings: Secondary | ICD-10-CM | POA: Diagnosis not present

## 2017-11-29 NOTE — Patient Instructions (Signed)
Preparing for Pregnancy If you are considering becoming pregnant, make an appointment to see your regular health care provider to learn how to prepare for a safe and healthy pregnancy (preconception care). During a preconception care visit, your health care provider will:  Do a complete physical exam, including a Pap test.  Take a complete medical history.  Give you information, answer your questions, and help you resolve problems.  Preconception checklist Medical history  Tell your health care provider about any current or past medical conditions. Your pregnancy or your ability to become pregnant may be affected by chronic conditions, such as diabetes, chronic hypertension, and thyroid problems.  Include your family's medical history as well as your partner's medical history.  Tell your health care provider about any history of STIs (sexually transmitted infections).These can affect your pregnancy. In some cases, they can be passed to your baby. Discuss any concerns that you have about STIs.  If indicated, discuss the benefits of genetic testing. This testing will show whether there are any genetic conditions that may be passed from you or your partner to your baby.  Tell your health care provider about: ? Any problems you have had with conception or pregnancy. ? Any medicines you take. These include vitamins, herbal supplements, and over-the-counter medicines. ? Your history of immunizations. Discuss any vaccinations that you may need.  Diet  Ask your health care provider what to include in a healthy diet that has a balance of nutrients. This is especially important when you are pregnant or preparing to become pregnant.  Ask your health care provider to help you reach a healthy weight before pregnancy. ? If you are overweight, you may be at higher risk for certain complications, such as high blood pressure, diabetes, and preterm birth. ? If you are underweight, you are more likely  to have a baby who has a low birth weight.  Lifestyle, work, and home  Let your health care provider know: ? About any lifestyle habits that you have, such as alcohol use, drug use, or smoking. ? About recreational activities that may put you at risk during pregnancy, such as downhill skiing and certain exercise programs. ? Tell your health care provider about any international travel, especially any travel to places with an active [place] virus outbreak. ? About harmful substances that you may be exposed to at work or at home. These include chemicals, pesticides, radiation, or even litter boxes. ? If you do not feel safe at home.  Mental health  Tell your health care provider about: ? Any history of mental health conditions, including feelings of depression, sadness, or anxiety. ? Any medicines that you take for a mental health condition. These include herbs and supplements.  Home instructions to prepare for pregnancy Lifestyle  Eat a balanced diet. This includes fresh fruits and vegetables, whole grains, lean meats, low-fat dairy products, healthy fats, and foods that are high in fiber. Ask to meet with a nutritionist or registered dietitian for assistance with meal planning and goals.  Get regular exercise. Try to be active for at least 30 minutes a day on most days of the week. Ask your health care provider which activities are safe during pregnancy.  Do not use any products that contain nicotine or tobacco, such as cigarettes and e-cigarettes. If you need help quitting, ask your health care provider.  Do not drink alcohol.  Do not take illegal drugs.  Maintain a healthy weight. Ask your health care provider what weight range is  right for you.  General instructions  Keep an accurate record of your menstrual periods. This makes it easier for your health care provider to determine your baby's due date.  Begin taking prenatal vitamins and folic acid supplements daily as directed by  your health care provider.  Manage any chronic conditions, such as high blood pressure and diabetes, as told by your health care provider. This is important.  How do I know that I am pregnant? You may be pregnant if you have been sexually active and you miss your period. Symptoms of early pregnancy include:  Mild cramping.  Very light vaginal bleeding (spotting).  Feeling unusually tired.  Nausea and vomiting (morning sickness).  If you have any of these symptoms and you suspect that you might be pregnant, you can take a home pregnancy test. These tests check for a hormone in your urine (human chorionic gonadotropin, or hCG). A woman's body begins to make this hormone during early pregnancy. These tests are very accurate. Wait until at least the first day after you miss your period to take one. If the test shows that you are pregnant (you get a positive result), call your health care provider to make an appointment for prenatal care. What should I do if I become pregnant?  Make an appointment with your health care provider as soon as you suspect you are pregnant.  Do not use any products that contain nicotine, such as cigarettes, chewing tobacco, and e-cigarettes. If you need help quitting, ask your health care provider.  Do not drink alcoholic beverages. Alcohol is related to a number of birth defects.  Avoid toxic odors and chemicals.  You may continue to have sexual intercourse if it does not cause pain or other problems, such as vaginal bleeding. This information is not intended to replace advice given to you by your health care provider. Make sure you discuss any questions you have with your health care provider. Document Released: 03/23/2008 Document Revised: 12/07/2015 Document Reviewed: 10/31/2015 Elsevier Interactive Patient Education  2018 [employer] Inc[employer]  Preventive Care 18-39 Years, Female Preventive care refers to lifestyle choices and visits with your health care provider  that can promote health and wellness. What does preventive care include?  A yearly physical exam. This is also called an annual well check.  Dental exams once or twice a year.  Routine eye exams. Ask your health care provider how often you should have your eyes checked.  Personal lifestyle choices, including: ? Daily care of your teeth and gums. ? Regular physical activity. ? Eating a healthy diet. ? Avoiding tobacco and drug use. ? Limiting alcohol use. ? Practicing safe sex. ? Taking vitamin and mineral supplements as recommended by your health care provider. What happens during an annual well check? The services and screenings done by your health care provider during your annual well check will depend on your age, overall health, lifestyle risk factors, and family history of disease. Counseling Your health care provider may ask you questions about your:  Alcohol use.  Tobacco use.  Drug use.  Emotional well-being.  Home and relationship well-being.  Sexual activity.  Eating habits.  Work and work environment.  Method of birth control.  Menstrual cycle.  Pregnancy history.  Screening You may have the following tests or measurements:  Height, weight, and BMI.  Diabetes screening. This is done by checking your blood sugar (glucose) after you have not eaten for a while (fasting).  Blood pressure.  Lipid and cholesterol levels. These   may be checked every 5 years starting at age 20.  Skin check.  Hepatitis C blood test.  Hepatitis B blood test.  Sexually transmitted disease (STD) testing.  BRCA-related cancer screening. This may be done if you have a family history of breast, ovarian, tubal, or peritoneal cancers.  Pelvic exam and Pap test. This may be done every 3 years starting at age 21. Starting at age 30, this may be done every 5 years if you have a Pap test in combination with an HPV test.  Discuss your test results, treatment options, and if  necessary, the need for more tests with your health care provider. Vaccines Your health care provider may recommend certain vaccines, such as:  Influenza vaccine. This is recommended every year.  Tetanus, diphtheria, and acellular pertussis (Tdap, Td) vaccine. You may need a Td booster every 10 years.  Varicella vaccine. You may need this if you have not been vaccinated.  HPV vaccine. If you are 26 or younger, you may need three doses over 6 months.  Measles, mumps, and rubella (MMR) vaccine. You may need at least one dose of MMR. You may also need a second dose.  Pneumococcal 13-valent conjugate (PCV13) vaccine. You may need this if you have certain conditions and were not previously vaccinated.  Pneumococcal polysaccharide (PPSV23) vaccine. You may need one or two doses if you smoke cigarettes or if you have certain conditions.  Meningococcal vaccine. One dose is recommended if you are age 19-21 years and a first-year college student living in a residence hall, or if you have one of several medical conditions. You may also need additional booster doses.  Hepatitis A vaccine. You may need this if you have certain conditions or if you travel or work in places where you may be exposed to hepatitis A.  Hepatitis B vaccine. You may need this if you have certain conditions or if you travel or work in places where you may be exposed to hepatitis B.  Haemophilus influenzae type b (Hib) vaccine. You may need this if you have certain risk factors.  Talk to your health care provider about which screenings and vaccines you need and how often you need them. This information is not intended to replace advice given to you by your health care provider. Make sure you discuss any questions you have with your health care provider. Document Released: 06/06/2001 Document Revised: 12/29/2015 Document Reviewed: 02/09/2015 Elsevier Interactive Patient Education  2018 Elsevier Inc.  

## 2017-12-02 NOTE — Progress Notes (Signed)
ANNUAL PREVENTATIVE CARE GYN  ENCOUNTER NOTE  Subjective:       Patsy Zaragoza is a 27 y.o. G81P0010 female here for a routine annual gynecologic exam.    Denies difficulty breathing or respiratory distress, chest pain, abdominal pain, excessive vaginal bleeding, dysuria, and leg pain or swelling.    Gynecologic History  Patient's last menstrual period was 11/09/2017 (exact date).  Contraception: Natural Family Planning  Last Pap: 11/20/2016. Results were: normal  Obstetric History  OB History  Gravida Para Term Preterm AB Living  1       1    SAB TAB Ectopic Multiple Live Births  1            # Outcome Date GA Lbr Len/2nd Weight Sex Delivery Anes PTL Lv  1 SAB 2014            No past medical history on file.  Past Surgical History:  Procedure Laterality Date  . ANTERIOR CRUCIATE LIGAMENT REPAIR    . APPENDECTOMY    . WISDOM TOOTH EXTRACTION      Allergies  Allergen Reactions  . Codeine   . Hydromorphone     Other reaction(s): Vomiting  . Oxycodone     Other reaction(s): Vomiting  . Sulfa Antibiotics     Social History   Socioeconomic History  . Marital status: Married    Spouse name: Not on file  . Number of children: Not on file  . Years of education: Not on file  . Highest education level: Not on file  Occupational History  . Not on file  Social Needs  . Financial resource strain: Not on file  . Food insecurity:    Worry: Not on file    Inability: Not on file  . Transportation needs:    Medical: Not on file    Non-medical: Not on file  Tobacco Use  . Smoking status: Never Smoker  . Smokeless tobacco: Never Used  Substance and Sexual Activity  . Alcohol use: Yes    Comment: occas  . Drug use: No  . Sexual activity: Yes    Birth control/protection: None  Lifestyle  . Physical activity:    Days per week: Not on file    Minutes per session: Not on file  . Stress: Not on file  Relationships  . Social connections:    Talks on phone:  Not on file    Gets together: Not on file    Attends religious service: Not on file    Active member of club or organization: Not on file    Attends meetings of clubs or organizations: Not on file    Relationship status: Not on file  . Intimate partner violence:    Fear of current or ex partner: Not on file    Emotionally abused: Not on file    Physically abused: Not on file    Forced sexual activity: Not on file  Other Topics Concern  . Not on file  Social History Narrative  . Not on file    Family History  Problem Relation Age of Onset  . Heart attack Maternal Grandfather   . Healthy Mother   . Healthy Father   . Breast cancer Neg Hx   . Ovarian cancer Neg Hx   . Colon cancer Neg Hx   . Diabetes Neg Hx     The following portions of the patient's history were reviewed and updated as appropriate: allergies, current medications, past family history, past  medical history, past social history, past surgical history and problem list.  Review of Systems  ROS negative except as noted above. Information obtained from patient.    Objective:   BP 126/83   Pulse 79   Ht 5\' 9"  (1.753 m)   Wt 179 lb 3.2 oz (81.3 kg)   LMP 11/09/2017 (Exact Date)   BMI 26.46 kg/m    CONSTITUTIONAL: Well-developed, well-nourished female in no acute distress.   PSYCHIATRIC: Normal mood and affect. Normal behavior. Normal judgment and thought content.  NEUROLGIC: Alert and oriented to person, place, and time. Normal muscle tone coordination. No cranial nerve deficit noted.  HENT:  Normocephalic, atraumatic, External right and left ear normal.   EYES: Conjunctivae and EOM are normal. Pupils are equal and round.    NECK: Normal range of motion, supple, no masses.  Normal thyroid.   SKIN: Skin is warm and dry. No rash noted. Not diaphoretic. No erythema. No pallor.  CARDIOVASCULAR: Normal heart rate noted, regular rhythm, no murmur.  RESPIRATORY: Clear to auscultation bilaterally. Effort and  breath sounds normal, no problems with  respiration noted.  BREASTS: Symmetric in size. No masses, skin changes, nipple drainage, or lymphadenopathy.  ABDOMEN: Soft, normal bowel sounds, no distention noted.  No tenderness, rebound or guarding. Overweight.  PELVIC:  External Genitalia: Normal  Vagina: Normal  Cervix: Normal  Uterus: Normal  Adnexa: Normal  MUSCULOSKELETAL: Normal range of motion. No tenderness.  No cyanosis, clubbing, or edema.  2+ distal pulses.  LYMPHATIC: No Axillary, Supraclavicular, or Inguinal Adenopathy.    [facility]  PHQ-9 Total Score  3     Assessment:   Annual gynecologic examination 27 y.o.   Contraception: Natural Family Planning   Overweight   Problem List Items Addressed This Visit    None    Visit Diagnoses    Well woman exam    -  Primary   Relevant Orders   CBC   Comprehensive metabolic panel   Lipid panel      Plan:   Pap: Not needed  Labs: See orders  Routine preventative health maintenance measures emphasized: Exercise/Diet/Weight control, Tobacco Warnings, Alcohol/Substance use risks and Stress Management; See AVS  Reviewed red flag symptoms and when to call  RTC x 1 year for Annual Exam or sooner if needed   Gunnar BullaJenkins Michelle Aloura Matsuoka, CNM [facility]

## 2017-12-19 ENCOUNTER — Other Ambulatory Visit: Payer: Commercial Managed Care - PPO

## 2017-12-20 ENCOUNTER — Encounter: Payer: Self-pay | Admitting: Certified Nurse Midwife

## 2017-12-20 LAB — COMPREHENSIVE METABOLIC PANEL
A/G RATIO: 1.9 (ref 1.2–2.2)
ALBUMIN: 4.5 g/dL (ref 3.5–5.5)
ALT: 6 IU/L (ref 0–32)
AST: 15 IU/L (ref 0–40)
Alkaline Phosphatase: 59 IU/L (ref 39–117)
BUN / CREAT RATIO: 9 (ref 9–23)
BUN: 7 mg/dL (ref 6–20)
CHLORIDE: 103 mmol/L (ref 96–106)
CO2: 23 mmol/L (ref 20–29)
Calcium: 9.7 mg/dL (ref 8.7–10.2)
Creatinine, Ser: 0.78 mg/dL (ref 0.57–1.00)
GFR calc Af Amer: 119 mL/min/{1.73_m2} (ref >59)
GFR calc non Af Amer: 103 mL/min/{1.73_m2} (ref >59)
GLOBULIN, TOTAL: 2.4 g/dL (ref 1.5–4.5)
Glucose: 88 mg/dL (ref 65–99)
POTASSIUM: 4.4 mmol/L (ref 3.5–5.2)
SODIUM: 143 mmol/L (ref 134–144)
Total Protein: 6.9 g/dL (ref 6.0–8.5)

## 2017-12-20 LAB — LIPID PANEL
CHOLESTEROL TOTAL: 137 mg/dL (ref 100–199)
Chol/HDL Ratio: 2.9 ratio (ref 0.0–4.4)
HDL: 47 mg/dL (ref >39)
LDL CALC: 59 mg/dL (ref 0–99)
Triglycerides: 154 mg/dL — ABNORMAL HIGH (ref 0–149)
VLDL Cholesterol Cal: 31 mg/dL (ref 5–40)

## 2017-12-20 LAB — CBC
HEMOGLOBIN: 14.2 g/dL (ref 11.1–15.9)
Hematocrit: 41.9 % (ref 34.0–46.6)
MCH: 29.9 pg (ref 26.6–33.0)
MCHC: 33.9 g/dL (ref 31.5–35.7)
MCV: 88 fL (ref 79–97)
PLATELETS: 382 10*3/uL (ref 150–450)
RBC: 4.75 x10E6/uL (ref 3.77–5.28)
RDW: 13.9 % (ref 12.3–15.4)
WBC: 6.2 10*3/uL (ref 3.4–10.8)

## 2018-01-24 ENCOUNTER — Ambulatory Visit (INDEPENDENT_AMBULATORY_CARE_PROVIDER_SITE_OTHER): Payer: Commercial Managed Care - PPO | Admitting: Certified Nurse Midwife

## 2018-01-24 ENCOUNTER — Other Ambulatory Visit: Payer: Self-pay | Admitting: Certified Nurse Midwife

## 2018-01-24 ENCOUNTER — Other Ambulatory Visit (INDEPENDENT_AMBULATORY_CARE_PROVIDER_SITE_OTHER): Payer: Commercial Managed Care - PPO

## 2018-01-24 ENCOUNTER — Encounter: Payer: Self-pay | Admitting: Certified Nurse Midwife

## 2018-01-24 VITALS — BP 125/64 | HR 48 | Ht 69.0 in | Wt 172.5 lb

## 2018-01-24 DIAGNOSIS — N926 Irregular menstruation, unspecified: Secondary | ICD-10-CM

## 2018-01-24 DIAGNOSIS — O3680X0 Pregnancy with inconclusive fetal viability, not applicable or unspecified: Secondary | ICD-10-CM | POA: Diagnosis not present

## 2018-01-24 DIAGNOSIS — Z3201 Encounter for pregnancy test, result positive: Secondary | ICD-10-CM | POA: Diagnosis not present

## 2018-01-24 DIAGNOSIS — Z3A01 Less than 8 weeks gestation of pregnancy: Secondary | ICD-10-CM | POA: Diagnosis not present

## 2018-01-24 DIAGNOSIS — O219 Vomiting of pregnancy, unspecified: Secondary | ICD-10-CM | POA: Diagnosis not present

## 2018-01-24 LAB — POCT URINE PREGNANCY: PREG TEST UR: POSITIVE — AB

## 2018-01-24 MED ORDER — RANITIDINE HCL 150 MG PO CAPS: 150.0000 mg | ORAL_CAPSULE | Freq: Two times a day (BID) | ORAL | 2 refills | Status: DC

## 2018-01-24 NOTE — Patient Instructions (Signed)
WHAT OB PATIENTS CAN EXPECT   Confirmation of pregnancy and ultrasound ordered if medically indicated-[redacted] weeks gestation  New OB (NOB) intake with nurse and New OB (NOB) labs- [redacted] weeks gestation  New OB (NOB) physical examination with provider- 11/[redacted] weeks gestation  Flu vaccine-[redacted] weeks gestation  Anatomy scan-[redacted] weeks gestation  Glucose tolerance test, blood work to test for anemia, T-dap vaccine-[redacted] weeks gestation  Vaginal swabs/cultures-STD/Group B strep-[redacted] weeks gestation  Appointments every 4 weeks until 28 weeks  Every 2 weeks from 28 weeks until 36 weeks  Weekly visits from 8 weeks until delivery  First Trimester of Pregnancy The first trimester of pregnancy is from week 1 until the end of week 13 (months 1 through 3). During this time, your baby will begin to develop inside you. At 6-8 weeks, the eyes and face are formed, and the heartbeat can be seen on ultrasound. At the end of 12 weeks, all the baby's organs are formed. Prenatal care is all the medical care you receive before the birth of your baby. Make sure you get good prenatal care and follow all of your doctor's instructions. Follow these instructions at home: Medicines  Take over-the-counter and prescription medicines only as told by your doctor. Some medicines are safe and some medicines are not safe during pregnancy.  Take a prenatal vitamin that contains at least 600 micrograms (mcg) of folic acid.  If you have trouble pooping (constipation), take medicine that will make your stool soft (stool softener) if your doctor approves. Eating and drinking  Eat regular, healthy meals.  Your doctor will tell you the amount of weight gain that is right for you.  Avoid raw meat and uncooked cheese.  If you feel sick to your stomach (nauseous) or throw up (vomit): ? Eat 4 or 5 small meals a day instead of 3 large meals. ? Try eating a few soda crackers. ? Drink liquids between meals instead of during meals.  To  prevent constipation: ? Eat foods that are high in fiber, like fresh fruits and vegetables, whole grains, and beans. ? Drink enough fluids to keep your pee (urine) clear or pale yellow. Activity  Exercise only as told by your doctor. Stop exercising if you have cramps or pain in your lower belly (abdomen) or low back.  Do not exercise if it is too hot, too humid, or if you are in a place of great height (high altitude).  Try to avoid standing for long periods of time. Move your legs often if you must stand in one place for a long time.  Avoid heavy lifting.  Wear low-heeled shoes. Sit and stand up straight.  You can have sex unless your doctor tells you not to. Relieving pain and discomfort  Wear a good support bra if your breasts are sore.  Take warm water baths (sitz baths) to soothe pain or discomfort caused by hemorrhoids. Use hemorrhoid cream if your doctor says it is okay.  Rest with your legs raised if you have leg cramps or low back pain.  If you have puffy, bulging veins (varicose veins) in your legs: ? Wear support hose or compression stockings as told by your doctor. ? Raise (elevate) your feet for 15 minutes, 3-4 times a day. ? Limit salt in your food. Prenatal care  Schedule your prenatal visits by the twelfth week of pregnancy.  Write down your questions. Take them to your prenatal visits.  Keep all your prenatal visits as told by your doctor. This  is important. Safety  Wear your seat belt at all times when driving.  Make a list of emergency phone numbers. The list should include numbers for family, friends, the hospital, and police and fire departments. General instructions  Ask your doctor for a referral to a local prenatal class. Begin classes no later than at the start of month 6 of your pregnancy.  Ask for help if you need counseling or if you need help with nutrition. Your doctor can give you advice or tell you where to go for help.  Do not use hot  tubs, steam rooms, or saunas.  Do not douche or use tampons or scented sanitary pads.  Do not cross your legs for long periods of time.  Avoid all herbs and alcohol. Avoid drugs that are not approved by your doctor.  Do not use any tobacco products, including cigarettes, chewing tobacco, and electronic cigarettes. If you need help quitting, ask your doctor. You may get counseling or other support to help you quit.  Avoid cat litter boxes and soil used by cats. These carry germs that can cause birth defects in the baby and can cause a loss of your baby (miscarriage) or stillbirth.  Visit your dentist. At home, brush your teeth with a soft toothbrush. Be gentle when you floss. Contact a doctor if:  You are dizzy.  You have mild cramps or pressure in your lower belly.  You have a nagging pain in your belly area.  You continue to feel sick to your stomach, you throw up, or you have watery poop (diarrhea).  You have a bad smelling fluid coming from your vagina.  You have pain when you pee (urinate).  You have increased puffiness (swelling) in your face, hands, legs, or ankles. Get help right away if:  You have a fever.  You are leaking fluid from your vagina.  You have spotting or bleeding from your vagina.  You have very bad belly cramping or pain.  You gain or lose weight rapidly.  You throw up blood. It may look like coffee grounds.  You are around people who have [place] measles, fifth disease, or chickenpox.  You have a very bad headache.  You have shortness of breath.  You have any kind of trauma, such as from a fall or a car accident. Summary  The first trimester of pregnancy is from week 1 until the end of week 13 (months 1 through 3).  To take care of yourself and your unborn baby, you will need to eat healthy meals, take medicines only if your doctor tells you to do so, and do activities that are safe for you and your baby.  Keep all follow-up visits as told  by your doctor. This is important as your doctor will have to ensure that your baby is healthy and growing well. This information is not intended to replace advice given to you by your health care provider. Make sure you discuss any questions you have with your health care provider. Document Released: 09/27/2007 Document Revised: 04/18/2016 Document Reviewed: 04/18/2016 Elsevier Interactive Patient Education  2017 [employer] [employer]. Common Medications Safe in Pregnancy  Acne:      Constipation:  Benzoyl Peroxide     Colace  Clindamycin      Dulcolax Suppository  Topica Erythromycin     Fibercon  Salicylic Acid      Metamucil         Miralax AVOID:        Senakot  Accutane    Cough:  Retin-A       Cough Drops  Tetracycline      Phenergan w/ Codeine if Rx  Minocycline      Robitussin (Plain & DM)  Antibiotics:     Crabs/Lice:  Ceclor       RID  Cephalosporins    AVOID:  E-Mycins      Kwell  Keflex  Macrobid/Macrodantin   Diarrhea:  Penicillin      Kao-Pectate  Zithromax      Imodium AD         PUSH FLUIDS AVOID:       Cipro     Fever:  Tetracycline      Tylenol (Regular or Extra  Minocycline       Strength)  Levaquin      Extra Strength-Do not          Exceed 8 tabs/24 hrs Caffeine:        <227m/day (equiv. To 1 cup of coffee or  approx. 3 12 oz sodas)         Gas: Cold/Hayfever:       Gas-X  Benadryl      Mylicon  Claritin       Phazyme  **Claritin-D        Chlor-Trimeton    Headaches:  Dimetapp      ASA-Free Excedrin  Drixoral-Non-Drowsy     Cold Compress  Mucinex (Guaifenasin)     Tylenol (Regular or Extra  Sudafed/Sudafed-12 Hour     Strength)  **Sudafed PE Pseudoephedrine   Tylenol Cold & Sinus     Vicks Vapor Rub  Zyrtec  **AVOID if Problems With Blood Pressure         Heartburn: Avoid lying down for at least 1 hour after meals  Aciphex      Maalox     Rash:  Milk of Magnesia     Benadryl    Mylanta       1% Hydrocortisone Cream  Pepcid  Pepcid  Complete   Sleep Aids:  Prevacid      Ambien   Prilosec       Benadryl  Rolaids       Chamomile Tea  Tums (Limit 4/day)     Unisom  Zantac       Tylenol PM         Warm milk-add vanilla or  Hemorrhoids:       Sugar for taste  Anusol/Anusol H.C.  (RX: Analapram 2.5%)  Sugar Substitutes:  Hydrocortisone OTC     Ok in moderation  Preparation H      Tucks        Vaseline lotion applied to tissue with wiping    Herpes:     Throat:  Acyclovir      Oragel  Famvir  Valtrex     Vaccines:         Flu Shot Leg Cramps:       *Gardasil  Benadryl      Hepatitis A         Hepatitis B Nasal Spray:       Pneumovax  Saline Nasal Spray     Polio Booster         Tetanus Nausea:       Tuberculosis test or PPD  Vitamin B6 25 mg TID   AVOID:    Dramamine      *Gardasil  Emetrol       Live Poliovirus  Ginger Root 250 mg QID    MMR (measles, mumps &  High Complex Carbs @ Bedtime    rebella)  Sea Bands-Accupressure    Varicella (Chickenpox)  Unisom 1/2 tab TID     *No known complications           If received before Pain:         Known pregnancy;   Darvocet       Resume series after  Lortab        Delivery  Percocet    Yeast:   Tramadol      Femstat  Tylenol 3      Gyne-lotrimin  Ultram       Monistat  Vicodin           MISC:         All Sunscreens           Hair Coloring/highlights          Insect Repellant's          (Including DEET)         Mystic Tans Morning Sickness Morning sickness is when you feel sick to your stomach (nauseous) during pregnancy. You may feel sick to your stomach and throw up (vomit). You may feel sick in the morning, but you can feel this way any time of day. Some women feel very sick to their stomach and cannot stop throwing up (hyperemesis gravidarum). Follow these instructions at home:  Only take medicines as told by your doctor.  Take multivitamins as told by your doctor. Taking multivitamins before getting pregnant can stop or lessen the harshness of  morning sickness.  Eat dry toast or unsalted crackers before getting out of bed.  Eat 5 to 6 small meals a day.  Eat dry and bland foods like rice and baked potatoes.  Do not drink liquids with meals. Drink between meals.  Do not eat greasy, fatty, or spicy foods.  Have someone cook for you if the smell of food causes you to feel sick or throw up.  If you feel sick to your stomach after taking prenatal vitamins, take them at night or with a snack.  Eat protein when you need a snack (nuts, yogurt, cheese).  Eat unsweetened gelatins for dessert.  Wear a bracelet used for sea sickness (acupressure wristband).  Go to a doctor that puts thin needles into certain body points (acupuncture) to improve how you feel.  Do not smoke.  Use a humidifier to keep the air in your house free of odors.  Get lots of fresh air. Contact a doctor if:  You need medicine to feel better.  You feel dizzy or lightheaded.  You are losing weight. Get help right away if:  You feel very sick to your stomach and cannot stop throwing up.  You pass out (faint). This information is not intended to replace advice given to you by your health care provider. Make sure you discuss any questions you have with your health care provider. Document Released: 05/18/2004 Document Revised: 09/16/2015 Document Reviewed: 09/25/2012 Elsevier Interactive Patient Education  2017 [place] for Pregnant Women While you are pregnant, your body will require additional nutrition to help support your growing baby. It is recommended that you consume:  150 additional calories each day during your first trimester.  300 additional calories each day during your second trimester.  300 additional calories each day during your third trimester.  Eating a healthy, well-balanced diet is very important  for your health and for your baby's health. You also have a higher need for some vitamins and minerals, such as folic  acid, calcium, iron, and vitamin D. What do I need to know about eating during pregnancy?  Do not try to lose weight or go on a diet during pregnancy.  Choose healthy, nutritious foods. Choose  of a sandwich with a glass of milk instead of a candy bar or a high-calorie sugar-sweetened beverage.  Limit your overall intake of foods that have "empty calories." These are foods that have little nutritional value, such as sweets, desserts, candies, sugar-sweetened beverages, and fried foods.  Eat a variety of foods, especially fruits and vegetables.  Take a prenatal vitamin to help meet the additional needs during pregnancy, specifically for folic acid, iron, calcium, and vitamin D.  Remember to stay active. Ask your health care provider for exercise recommendations that are specific to you.  Practice good food safety and cleanliness, such as washing your hands before you eat and after you prepare raw meat. This helps to prevent foodborne illnesses, such as listeriosis, that can be very dangerous for your baby. Ask your health care provider for more information about listeriosis. What does 150 extra calories look like? Healthy options for an additional 150 calories each day could be any of the following:  Plain low-fat yogurt (6-8 oz) with  cup of berries.  1 apple with 2 teaspoons of peanut butter.  Cut-up vegetables with  cup of hummus.  Low-fat chocolate milk (8 oz or 1 cup).  1 string cheese with 1 medium orange.   of a peanut butter and jelly sandwich on whole-wheat bread (1 tsp of peanut butter).  For 300 calories, you could eat two of those healthy options each day. What is a healthy amount of weight to gain? The recommended amount of weight for you to gain is based on your pre-pregnancy BMI. If your pre-pregnancy BMI was:  Less than 18 (underweight), you should gain 28-40 lb.  18-24.9 (normal), you should gain 25-35 lb.  25-29.9 (overweight), you should gain 15-25  lb.  Greater than 30 (obese), you should gain 11-20 lb.  What if I am having twins or multiples? Generally, pregnant women who will be having twins or multiples may need to increase their daily calories by 300-600 calories each day. The recommended range for total weight gain is 25-54 lb, depending on your pre-pregnancy BMI. Talk with your health care provider for specific guidance about additional nutritional needs, weight gain, and exercise during your pregnancy. What foods can I eat? Grains Any grains. Try to choose whole grains, such as whole-wheat bread, oatmeal, or brown rice. Vegetables Any vegetables. Try to eat a variety of colors and types of vegetables to get a full range of vitamins and minerals. Remember to wash your vegetables well before eating. Fruits Any fruits. Try to eat a variety of colors and types of fruit to get a full range of vitamins and minerals. Remember to wash your fruits well before eating. Meats and Other Protein Sources Lean meats, including chicken, [place], fish, and lean cuts of beef, veal, or pork. Make sure that all meats are cooked to "well done." Tofu. Tempeh. Beans. Eggs. Peanut butter and other nut butters. Seafood, such as shrimp, crab, and lobster. If you choose fish, select types that are higher in omega-3 fatty acids, including salmon, herring, mussels, trout, sardines, and pollock. Make sure that all meats are cooked to food-safe temperatures. Dairy Pasteurized milk and milk  alternatives. Pasteurized yogurt and pasteurized cheese. Cottage cheese. Sour cream. Beverages Water. Juices that contain 100% fruit juice or vegetable juice. Caffeine-free teas and decaffeinated coffee. Drinks that contain caffeine are okay to drink, but it is better to avoid caffeine. Keep your total caffeine intake to less than 200 mg each day (12 oz of coffee, tea, or soda) or as directed by your health care provider. Condiments Any pasteurized condiments. Sweets and  Desserts Any sweets and desserts. Fats and Oils Any fats and oils. The items listed above may not be a complete list of recommended foods or beverages. Contact your dietitian for more options. What foods are not recommended? Vegetables Unpasteurized (raw) vegetable juices. Fruits Unpasteurized (raw) fruit juices. Meats and Other Protein Sources Cured meats that have nitrates, such as bacon, salami, and hotdogs. Luncheon meats, bologna, or other deli meats (unless they are reheated until they are steaming hot). Refrigerated pate, meat spreads from a meat counter, smoked seafood that is found in the refrigerated section of a store. Raw fish, such as sushi or sashimi. High mercury content fish, such as tilefish, shark, swordfish, and king mackerel. Raw meats, such as tuna or beef tartare. Undercooked meats and poultry. Make sure that all meats are cooked to food-safe temperatures. Dairy Unpasteurized (raw) milk and any foods that have raw milk in them. Soft cheeses, such as feta, queso blanco, queso fresco, Brie, Camembert cheeses, blue-veined cheeses, and Panela cheese (unless it is made with pasteurized milk, which must be stated on the label). Beverages Alcohol. Sugar-sweetened beverages, such as sodas, teas, or energy drinks. Condiments Homemade fermented foods and drinks, such as pickles, sauerkraut, or kombucha drinks. (Store-bought pasteurized versions of these are okay.) Other Salads that are made in the store, such as ham salad, chicken salad, egg salad, tuna salad, and seafood salad. The items listed above may not be a complete list of foods and beverages to avoid. Contact your dietitian for more information. This information is not intended to replace advice given to you by your health care provider. Make sure you discuss any questions you have with your health care provider. Document Released: 01/23/2014 Document Revised: 09/16/2015 Document Reviewed: 09/23/2013 Elsevier Interactive  Patient Education  [employer].

## 2018-01-27 NOTE — Progress Notes (Signed)
GYN ENCOUNTER NOTE  Subjective:       Regina Gray is a 27 y.o. G30P0010 female here for pregnancy confirmation.   Reports positive home pregnancy test x 3. Endorses nausea with vomiting, fatigue, and intermittent abdominal cramping.   Taking a prenatal vitamin with folic acid and DHA. Accompanied by spouse.    Gynecologic History  Patient's last menstrual period was 12/09/2017 (exact date).  Contraception: none  Last Pap: 10/2016. Results were: normal  Obstetric History  OB History  Gravida Para Term Preterm AB Living  2       1    SAB TAB Ectopic Multiple Live Births  1            # Outcome Date GA Lbr Len/2nd Weight Sex Delivery Anes PTL Lv  2 Current           1 SAB 2014            No past medical history on file.  Past Surgical History:  Procedure Laterality Date  . ANTERIOR CRUCIATE LIGAMENT REPAIR    . APPENDECTOMY    . WISDOM TOOTH EXTRACTION      Current Outpatient Medications on File Prior to Visit  Medication Sig Dispense Refill  . Multiple Vitamin (MULTIVITAMIN) tablet Take 1 tablet by mouth daily.    . Prenatal Vit-Fe Fumarate-FA (PRENATAL MULTIVITAMIN) TABS tablet Take 1 tablet by mouth daily at 12 noon.     No current facility-administered medications on file prior to visit.     Allergies  Allergen Reactions  . Codeine   . Hydromorphone     Other reaction(s): Vomiting  . Oxycodone     Other reaction(s): Vomiting  . Sulfa Antibiotics     Social History   Socioeconomic History  . Marital status: Married    Spouse name: Not on file  . Number of children: Not on file  . Years of education: Not on file  . Highest education level: Not on file  Occupational History  . Not on file  Social Needs  . Financial resource strain: Not on file  . Food insecurity:    Worry: Not on file    Inability: Not on file  . Transportation needs:    Medical: Not on file    Non-medical: Not on file  Tobacco Use  . Smoking status: Never Smoker  .  Smokeless tobacco: Never Used  Substance and Sexual Activity  . Alcohol use: Yes    Comment: occas  . Drug use: No  . Sexual activity: Yes    Birth control/protection: None  Lifestyle  . Physical activity:    Days per week: Not on file    Minutes per session: Not on file  . Stress: Not on file  Relationships  . Social connections:    Talks on phone: Not on file    Gets together: Not on file    Attends religious service: Not on file    Active member of club or organization: Not on file    Attends meetings of clubs or organizations: Not on file    Relationship status: Not on file  . Intimate partner violence:    Fear of current or ex partner: Not on file    Emotionally abused: Not on file    Physically abused: Not on file    Forced sexual activity: Not on file  Other Topics Concern  . Not on file  Social History Narrative  . Not on file  Family History  Problem Relation Age of Onset  . Heart attack Maternal Grandfather   . Healthy Mother   . Healthy Father   . Breast cancer Neg Hx   . Ovarian cancer Neg Hx   . Colon cancer Neg Hx   . Diabetes Neg Hx     The following portions of the patient's history were reviewed and updated as appropriate: allergies, current medications, past family history, past medical history, past social history, past surgical history and problem list.  Review of Systems  ROS negative except as noted above. Information obtained from patient.   Objective:   BP 125/64   Pulse (!) 48   Ht 5\' 9"  (1.753 m)   Wt 172 lb 8 oz (78.2 kg)   LMP 12/09/2017 (Exact Date)   BMI 25.47 kg/m    GENERAL: Alert and oriented x 4, no apparent distress.   PHYSICAL EXAM: Not indicated.    Lab Orders     POCT urine pregnancy-POSITIVE    ULTRASOUND REPORT  Location: [facility] Date of Service:  01/24/2018  Indications: Dating/Viability Findings:  Mason Jim intrauterine pregnancy is visualized with a CRL consistent with 6 3/[redacted] weeks  gestation, giving an (U/S) EDD of 09/16/18. A clinical EDD has not been established, however, today's scan agrees with patient stated LMP of 12/09/17.  FHR: 119 BPM CRL measurement: 5.8 mm Yolk sac and early anatomy is normal.  Right Ovary measures 2.8 x 1.9 x 1.8 cm. It is normal in appearance. Left Ovary measures 2.8 x 1.9 x 1.5 cm. It is normal appearance. There is no obvious evidence of a corpus luteal cyst. Survey of the adnexa demonstrates no adnexal masses. There is no free peritoneal fluid in the cul de sac.  Impression: 1. 6 3/7 week Viable Singleton Intrauterine pregnancy by U/S. 2. A clinical EDD has not been established, however, today's scan agrees with patient stated LMP of 12/09/17.  Recommendations: 1.Clinical correlation with the patient's History and Physical Exam.  Assessment:   1. Positive pregnancy test  - POCT urine pregnancy  2. Nausea/vomiting in pregnancy   Plan:   Ultrasound findings reviewed with patient and spouse, verbalized understanding.   First trimester education; see AVS.   Rx: Zantac, see orders.   Bonjesta samples given.   Reviewed red flag symptoms and when to call.   RTC x 2-3 weeks for Nurse Intake or sooner if needed.    Gunnar Bulla, CNM [facility], [facility]

## 2018-02-03 ENCOUNTER — Encounter: Payer: Self-pay | Admitting: Certified Nurse Midwife

## 2018-02-13 NOTE — Progress Notes (Signed)
      Regina Gray presents for NOB nurse intake visit. Pregnancy confirmation done at [facility], 01/24/18, with JML.  G2.  P0010.  LMP 12/09/17.  EDD 09/15/17 U/[place]. Pregnancy education material explained and given.  2 cats in the home.  NOB labs ordered. BMI less than 30. TSH/HbgA1c not ordered. HIV and drug screen explained and ordered. Genetic screening discussed. Genetic testing; Ordered PNV encouraged. Pt to follow up with provider in 4 weeks for NOB physical. [facility] and [facility] [employer] Policy reviewed and explained to pt. Pt stated that she understood.   BP 118/72   Pulse 87   Ht 5\' 9"  (1.753 m)   Wt 172 lb 11.2 oz (78.3 kg)   LMP 12/09/2017 (Exact Date)   BMI 25.50 kg/m

## 2018-02-15 ENCOUNTER — Ambulatory Visit (INDEPENDENT_AMBULATORY_CARE_PROVIDER_SITE_OTHER): Payer: Commercial Managed Care - PPO | Admitting: Certified Nurse Midwife

## 2018-02-15 ENCOUNTER — Telehealth: Payer: Self-pay | Admitting: Certified Nurse Midwife

## 2018-02-15 VITALS — BP 118/72 | HR 87 | Ht 69.0 in | Wt 172.7 lb

## 2018-02-15 DIAGNOSIS — Z3481 Encounter for supervision of other normal pregnancy, first trimester: Secondary | ICD-10-CM

## 2018-02-15 LAB — OB RESULTS CONSOLE GC/CHLAMYDIA: Gonorrhea: NEGATIVE

## 2018-02-15 LAB — OB RESULTS CONSOLE VARICELLA ZOSTER ANTIBODY, IGG: Varicella: IMMUNE

## 2018-02-15 NOTE — Telephone Encounter (Signed)
Upon checkout today, the patient voiced wanting the genetic testing, [facility].  She was given information for her new OB packet with [place] info on it, supplied by [employer] to me.  The patient stated she would like to do this at her next visit instead of today even though she is 9 weeks, **no further action required at this time** and this message will be sent to Spencer, working nurse schedule today, as well as Programmer, multimedia, Jasmine December.

## 2018-02-16 LAB — URINALYSIS, ROUTINE W REFLEX MICROSCOPIC
Bilirubin, UA: NEGATIVE
Glucose, UA: NEGATIVE
KETONES UA: NEGATIVE
LEUKOCYTES UA: NEGATIVE
NITRITE UA: NEGATIVE
Protein, UA: NEGATIVE
RBC, UA: NEGATIVE
SPEC GRAV UA: 1.009 (ref 1.005–1.030)
Urobilinogen, Ur: 0.2 mg/dL (ref 0.2–1.0)
pH, UA: 8 — ABNORMAL HIGH (ref 5.0–7.5)

## 2018-02-16 LAB — HGB SOLU + RFLX FRAC: Sickle Solubility Test - HGBRFX: NEGATIVE

## 2018-02-16 LAB — HEPATITIS B SURFACE ANTIGEN: HEP B S AG: NEGATIVE

## 2018-02-16 LAB — RUBELLA SCREEN: RUBELLA: 1.46 {index} (ref >0.99)

## 2018-02-16 LAB — ABO AND RH: Rh Factor: POSITIVE

## 2018-02-16 LAB — RPR: RPR Ser Ql: NONREACTIVE

## 2018-02-16 LAB — ANTIBODY SCREEN: ANTIBODY SCREEN: NEGATIVE

## 2018-02-16 LAB — HIV ANTIBODY (ROUTINE TESTING W REFLEX): HIV Screen 4th Generation wRfx: NONREACTIVE

## 2018-02-16 LAB — VARICELLA ZOSTER ANTIBODY, IGG: Varicella zoster IgG: 2276 index (ref >165)

## 2018-02-17 LAB — GC/CHLAMYDIA PROBE AMP
Chlamydia trachomatis, NAA: NEGATIVE
Neisseria gonorrhoeae by PCR: NEGATIVE

## 2018-02-17 LAB — URINE CULTURE

## 2018-02-18 NOTE — Progress Notes (Signed)
I have reviewed the record and concur with patient management and plan of care.    Jenkins Michelle Lawhorn, CNM [facility], [facility] 

## 2018-02-20 LAB — NICOTINE SCREEN, URINE: Cotinine Ql Scrn, Ur: NEGATIVE ng/mL

## 2018-02-20 LAB — MONITOR DRUG PROFILE 14(MW)
Amphetamine Scrn, Ur: NEGATIVE ng/mL
BARBITURATE SCREEN URINE: NEGATIVE ng/mL
BENZODIAZEPINE SCREEN, URINE: NEGATIVE ng/mL
BUPRENORPHINE, URINE: NEGATIVE ng/mL
CREATININE(CRT), U: 64.4 mg/dL (ref 20.0–300.0)
Cocaine (Metab) Scrn, Ur: NEGATIVE ng/mL
FENTANYL, URINE: NEGATIVE pg/mL
Meperidine Screen, Urine: NEGATIVE ng/mL
Methadone Screen, Urine: NEGATIVE ng/mL
OXYCODONE+OXYMORPHONE UR QL SCN: NEGATIVE ng/mL
Opiate Scrn, Ur: NEGATIVE ng/mL
Ph of Urine: 7.8 (ref 4.5–8.9)
Phencyclidine Qn, Ur: NEGATIVE ng/mL
Propoxyphene Scrn, Ur: NEGATIVE ng/mL
SPECIFIC GRAVITY: 1.01
Tramadol Screen, Urine: NEGATIVE ng/mL

## 2018-02-20 LAB — CANNABINOID (GC/MS), URINE: Cannabinoid: NEGATIVE

## 2018-03-05 ENCOUNTER — Encounter: Payer: Self-pay | Admitting: Certified Nurse Midwife

## 2018-03-11 ENCOUNTER — Other Ambulatory Visit: Payer: Self-pay

## 2018-03-11 ENCOUNTER — Ambulatory Visit (INDEPENDENT_AMBULATORY_CARE_PROVIDER_SITE_OTHER): Payer: Commercial Managed Care - PPO | Admitting: Certified Nurse Midwife

## 2018-03-11 VITALS — BP 123/68 | HR 61 | Wt 172.5 lb

## 2018-03-11 DIAGNOSIS — O99611 Diseases of the digestive system complicating pregnancy, first trimester: Secondary | ICD-10-CM

## 2018-03-11 DIAGNOSIS — O99619 Diseases of the digestive system complicating pregnancy, unspecified trimester: Secondary | ICD-10-CM

## 2018-03-11 DIAGNOSIS — Z130 Encounter for screening for diseases of the blood and blood-forming organs and certain disorders involving the immune mechanism: Secondary | ICD-10-CM

## 2018-03-11 DIAGNOSIS — O26819 Pregnancy related exhaustion and fatigue, unspecified trimester: Secondary | ICD-10-CM

## 2018-03-11 DIAGNOSIS — Z3A13 13 weeks gestation of pregnancy: Secondary | ICD-10-CM

## 2018-03-11 DIAGNOSIS — O26811 Pregnancy related exhaustion and fatigue, first trimester: Secondary | ICD-10-CM

## 2018-03-11 DIAGNOSIS — K219 Gastro-esophageal reflux disease without esophagitis: Secondary | ICD-10-CM

## 2018-03-11 DIAGNOSIS — Z3481 Encounter for supervision of other normal pregnancy, first trimester: Secondary | ICD-10-CM

## 2018-03-11 LAB — POCT URINALYSIS DIPSTICK OB
BILIRUBIN UA: NEGATIVE
Blood, UA: NEGATIVE
Glucose, UA: NEGATIVE
KETONES UA: NEGATIVE
Leukocytes, UA: NEGATIVE
Nitrite, UA: NEGATIVE
PH UA: 7 (ref 5.0–8.0)
Spec Grav, UA: 1.015 (ref 1.010–1.025)
UROBILINOGEN UA: 0.2 U/dL

## 2018-03-11 MED ORDER — DOXYLAMINE-PYRIDOXINE ER 20-20 MG PO TBCR: 20.0000 mg | EXTENDED_RELEASE_TABLET | Freq: Two times a day (BID) | ORAL | 4 refills | Status: DC

## 2018-03-11 MED ORDER — PANTOPRAZOLE SODIUM 20 MG PO TBEC: 20.0000 mg | DELAYED_RELEASE_TABLET | Freq: Two times a day (BID) | ORAL | 1 refills | Status: DC

## 2018-03-11 MED ORDER — DOXYLAMINE-PYRIDOXINE ER 20-20 MG PO TBCR: 20.0000 mg | EXTENDED_RELEASE_TABLET | Freq: Two times a day (BID) | ORAL | 4 refills | Status: AC

## 2018-03-11 NOTE — Patient Instructions (Signed)
Round Ligament Pain The round ligament is a cord of muscle and tissue that helps to support the uterus. It can become a source of pain during pregnancy if it becomes stretched or twisted as the baby grows. The pain usually begins in the second trimester of pregnancy, and it can come and go until the baby is delivered. It is not a serious problem, and it does not cause harm to the baby. Round ligament pain is usually a short, sharp, and pinching pain, but it can also be a dull, lingering, and aching pain. The pain is felt in the lower side of the abdomen or in the groin. It usually starts deep in the groin and moves up to the outside of the hip area. Pain can occur with:  A sudden change in position.  Rolling over in bed.  Coughing or sneezing.  Physical activity.  Follow these instructions at home: Watch your condition for any changes. Take these steps to help with your pain:  When the pain starts, relax. Then try: ? Sitting down. ? Flexing your knees up to your abdomen. ? Lying on your side with one pillow under your abdomen and another pillow between your legs. ? Sitting in a warm bath for 15-20 minutes or until the pain goes away.  Take over-the-counter and prescription medicines only as told by your health care provider.  Move slowly when you sit and stand.  Avoid long walks if they cause pain.  Stop or lessen your physical activities if they cause pain.  Contact a health care provider if:  Your pain does not go away with treatment.  You feel pain in your back that you did not have before.  Your medicine is not helping. Get help right away if:  You develop a fever or chills.  You develop uterine contractions.  You develop vaginal bleeding.  You develop nausea or vomiting.  You develop diarrhea.  You have pain when you urinate. This information is not intended to replace advice given to you by your health care provider. Make sure you discuss any questions you have  with your health care provider. Document Released: 01/18/2008 Document Revised: 09/16/2015 Document Reviewed: 06/17/2014 Elsevier Interactive Patient Education  2018 [employer].   Back Pain in Pregnancy Back pain during pregnancy is common. Back pain may be caused by several factors that are related to changes during your pregnancy. Follow these instructions at home: Managing pain, stiffness, and swelling  If directed, apply ice for sudden (acute) back pain. ? Put ice in a plastic bag. ? Place a towel between your skin and the bag. ? Leave the ice on for 20 minutes, 2-3 times per day.  If directed, apply heat to the affected area before you exercise: ? Place a towel between your skin and the heat pack or heating pad. ? Leave the heat on for 20-30 minutes. ? Remove the heat if your skin turns bright red. This is especially important if you are unable to feel pain, heat, or cold. You may have a greater risk of getting burned. Activity  Exercise as told by your health care provider. Exercising is the best way to prevent or manage back pain.  Listen to your body when lifting. If lifting hurts, ask for help or bend your knees. This uses your leg muscles instead of your back muscles.  Squat down when picking up something from the floor. Do not bend over.  Only use bed rest as told by your health care  provider. Bed rest should only be used for the most severe episodes of back pain. Standing, Sitting, and Lying Down  Do not stand in one place for long periods of time.  Use good posture when sitting. Make sure your head rests over your shoulders and is not hanging forward. Use a pillow on your lower back if necessary.  Try sleeping on your side, preferably the left side, with a pillow or two between your legs. If you are sore after a night's rest, your bed may be too soft. A firm mattress may provide more support for your back during pregnancy. General instructions  Do not wear high  heels.  Eat a healthy diet. Try to gain weight within your health care provider's recommendations.  Use a maternity girdle, elastic sling, or back brace as told by your health care provider.  Take over-the-counter and prescription medicines only as told by your health care provider.  Keep all follow-up visits as told by your health care provider. This is important. This includes any visits with any specialists, such as a physical therapist. Contact a health care provider if:  Your back pain interferes with your daily activities.  You have increasing pain in other parts of your body. Get help right away if:  You develop numbness, tingling, weakness, or problems with the use of your arms or legs.  You develop severe back pain that is not controlled with medicine.  You have a sudden change in bowel or bladder control.  You develop shortness of breath, dizziness, or you faint.  You develop nausea, vomiting, or sweating.  You have back pain that is a rhythmic, cramping pain similar to labor pains. Labor pain is usually 1-2 minutes apart, lasts for about 1 minute, and involves a bearing down feeling or pressure in your pelvis.  You have back pain and your water breaks or you have vaginal bleeding.  You have back pain or numbness that travels down your leg.  Your back pain developed after you fell.  You develop pain on one side of your back.  You see blood in your urine.  You develop skin blisters in the area of your back pain. This information is not intended to replace advice given to you by your health care provider. Make sure you discuss any questions you have with your health care provider. Document Released: 07/19/2005 Document Revised: 09/16/2015 Document Reviewed: 12/23/2014 Elsevier Interactive Patient Education  2018 [place] for Pregnant Women While you are pregnant, your body will require additional nutrition to help support your growing baby. It  is recommended that you consume:  150 additional calories each day during your first trimester.  300 additional calories each day during your second trimester.  300 additional calories each day during your third trimester.  Eating a healthy, well-balanced diet is very important for your health and for your baby's health. You also have a higher need for some vitamins and minerals, such as folic acid, calcium, iron, and vitamin D. What do I need to know about eating during pregnancy?  Do not try to lose weight or go on a diet during pregnancy.  Choose healthy, nutritious foods. Choose  of a sandwich with a glass of milk instead of a candy bar or a high-calorie sugar-sweetened beverage.  Limit your overall intake of foods that have "empty calories." These are foods that have little nutritional value, such as sweets, desserts, candies, sugar-sweetened beverages, and fried foods.  Eat a variety of  foods, especially fruits and vegetables.  Take a prenatal vitamin to help meet the additional needs during pregnancy, specifically for folic acid, iron, calcium, and vitamin D.  Remember to stay active. Ask your health care provider for exercise recommendations that are specific to you.  Practice good food safety and cleanliness, such as washing your hands before you eat and after you prepare raw meat. This helps to prevent foodborne illnesses, such as listeriosis, that can be very dangerous for your baby. Ask your health care provider for more information about listeriosis. What does 150 extra calories look like? Healthy options for an additional 150 calories each day could be any of the following:  Plain low-fat yogurt (6-8 oz) with  cup of berries.  1 apple with 2 teaspoons of peanut butter.  Cut-up vegetables with  cup of hummus.  Low-fat chocolate milk (8 oz or 1 cup).  1 string cheese with 1 medium orange.   of a peanut butter and jelly sandwich on whole-wheat bread (1 tsp of  peanut butter).  For 300 calories, you could eat two of those healthy options each day. What is a healthy amount of weight to gain? The recommended amount of weight for you to gain is based on your pre-pregnancy BMI. If your pre-pregnancy BMI was:  Less than 18 (underweight), you should gain 28-40 lb.  18-24.9 (normal), you should gain 25-35 lb.  25-29.9 (overweight), you should gain 15-25 lb.  Greater than 30 (obese), you should gain 11-20 lb.  What if I am having twins or multiples? Generally, pregnant women who will be having twins or multiples may need to increase their daily calories by 300-600 calories each day. The recommended range for total weight gain is 25-54 lb, depending on your pre-pregnancy BMI. Talk with your health care provider for specific guidance about additional nutritional needs, weight gain, and exercise during your pregnancy. What foods can I eat? Grains Any grains. Try to choose whole grains, such as whole-wheat bread, oatmeal, or brown rice. Vegetables Any vegetables. Try to eat a variety of colors and types of vegetables to get a full range of vitamins and minerals. Remember to wash your vegetables well before eating. Fruits Any fruits. Try to eat a variety of colors and types of fruit to get a full range of vitamins and minerals. Remember to wash your fruits well before eating. Meats and Other Protein Sources Lean meats, including chicken, [place], fish, and lean cuts of beef, veal, or pork. Make sure that all meats are cooked to "well done." Tofu. Tempeh. Beans. Eggs. Peanut butter and other nut butters. Seafood, such as shrimp, crab, and lobster. If you choose fish, select types that are higher in omega-3 fatty acids, including salmon, herring, mussels, trout, sardines, and pollock. Make sure that all meats are cooked to food-safe temperatures. Dairy Pasteurized milk and milk alternatives. Pasteurized yogurt and pasteurized cheese. Cottage cheese. Sour  cream. Beverages Water. Juices that contain 100% fruit juice or vegetable juice. Caffeine-free teas and decaffeinated coffee. Drinks that contain caffeine are okay to drink, but it is better to avoid caffeine. Keep your total caffeine intake to less than 200 mg each day (12 oz of coffee, tea, or soda) or as directed by your health care provider. Condiments Any pasteurized condiments. Sweets and Desserts Any sweets and desserts. Fats and Oils Any fats and oils. The items listed above may not be a complete list of recommended foods or beverages. Contact your dietitian for more options. What foods are not  recommended? Vegetables Unpasteurized (raw) vegetable juices. Fruits Unpasteurized (raw) fruit juices. Meats and Other Protein Sources Cured meats that have nitrates, such as bacon, salami, and hotdogs. Luncheon meats, bologna, or other deli meats (unless they are reheated until they are steaming hot). Refrigerated pate, meat spreads from a meat counter, smoked seafood that is found in the refrigerated section of a store. Raw fish, such as sushi or sashimi. High mercury content fish, such as tilefish, shark, swordfish, and king mackerel. Raw meats, such as tuna or beef tartare. Undercooked meats and poultry. Make sure that all meats are cooked to food-safe temperatures. Dairy Unpasteurized (raw) milk and any foods that have raw milk in them. Soft cheeses, such as feta, queso blanco, queso fresco, Brie, Camembert cheeses, blue-veined cheeses, and Panela cheese (unless it is made with pasteurized milk, which must be stated on the label). Beverages Alcohol. Sugar-sweetened beverages, such as sodas, teas, or energy drinks. Condiments Homemade fermented foods and drinks, such as pickles, sauerkraut, or kombucha drinks. (Store-bought pasteurized versions of these are okay.) Other Salads that are made in the store, such as ham salad, chicken salad, egg salad, tuna salad, and seafood salad. The items  listed above may not be a complete list of foods and beverages to avoid. Contact your dietitian for more information. This information is not intended to replace advice given to you by your health care provider. Make sure you discuss any questions you have with your health care provider. Document Released: 01/23/2014 Document Revised: 09/16/2015 Document Reviewed: 09/23/2013 Elsevier Interactive Patient Education  2018 [employer].   Morning Sickness Morning sickness is when you feel sick to your stomach (nauseous) during pregnancy. You may feel sick to your stomach and throw up (vomit). You may feel sick in the morning, but you can feel this way any time of day. Some women feel very sick to their stomach and cannot stop throwing up (hyperemesis gravidarum). Follow these instructions at home:  Only take medicines as told by your doctor.  Take multivitamins as told by your doctor. Taking multivitamins before getting pregnant can stop or lessen the harshness of morning sickness.  Eat dry toast or unsalted crackers before getting out of bed.  Eat 5 to 6 small meals a day.  Eat dry and bland foods like rice and baked potatoes.  Do not drink liquids with meals. Drink between meals.  Do not eat greasy, fatty, or spicy foods.  Have someone cook for you if the smell of food causes you to feel sick or throw up.  If you feel sick to your stomach after taking prenatal vitamins, take them at night or with a snack.  Eat protein when you need a snack (nuts, yogurt, cheese).  Eat unsweetened gelatins for dessert.  Wear a bracelet used for sea sickness (acupressure wristband).  Go to a doctor that puts thin needles into certain body points (acupuncture) to improve how you feel.  Do not smoke.  Use a humidifier to keep the air in your house free of odors.  Get lots of fresh air. Contact a doctor if:  You need medicine to feel better.  You feel dizzy or lightheaded.  You are losing  weight. Get help right away if:  You feel very sick to your stomach and cannot stop throwing up.  You pass out (faint). This information is not intended to replace advice given to you by your health care provider. Make sure you discuss any questions you have with your health care provider.  Document Released: 05/18/2004 Document Revised: 09/16/2015 Document Reviewed: 09/25/2012 Elsevier Interactive Patient Education  2017 [place] of Pregnancy The second trimester is from week 13 through week 28, month 4 through 6. This is often the time in pregnancy that you feel your best. Often times, morning sickness has lessened or quit. You may have more energy, and you may get hungry more often. Your unborn baby (fetus) is growing rapidly. At the end of the sixth month, he or she is about 9 inches long and weighs about 1 pounds. You will likely feel the baby move (quickening) between 18 and 20 weeks of pregnancy. Follow these instructions at home:  Avoid all smoking, herbs, and alcohol. Avoid drugs not approved by your doctor.  Do not use any tobacco products, including cigarettes, chewing tobacco, and electronic cigarettes. If you need help quitting, ask your doctor. You may get counseling or other support to help you quit.  Only take medicine as told by your doctor. Some medicines are safe and some are not during pregnancy.  Exercise only as told by your doctor. Stop exercising if you start having cramps.  Eat regular, healthy meals.  Wear a good support bra if your breasts are tender.  Do not use hot tubs, steam rooms, or saunas.  Wear your seat belt when driving.  Avoid raw meat, uncooked cheese, and liter boxes and soil used by cats.  Take your prenatal vitamins.  Take 1500-2000 milligrams of calcium daily starting at the 20th week of pregnancy until you deliver your baby.  Try taking medicine that helps you poop (stool softener) as needed, and if your doctor  approves. Eat more fiber by eating fresh fruit, vegetables, and whole grains. Drink enough fluids to keep your pee (urine) clear or pale yellow.  Take warm water baths (sitz baths) to soothe pain or discomfort caused by hemorrhoids. Use hemorrhoid cream if your doctor approves.  If you have puffy, bulging veins (varicose veins), wear support hose. Raise (elevate) your feet for 15 minutes, 3-4 times a day. Limit salt in your diet.  Avoid heavy lifting, wear low heals, and sit up straight.  Rest with your legs raised if you have leg cramps or low back pain.  Visit your dentist if you have not gone during your pregnancy. Use a soft toothbrush to brush your teeth. Be gentle when you floss.  You can have sex (intercourse) unless your doctor tells you not to.  Go to your doctor visits. Get help if:  You feel dizzy.  You have mild cramps or pressure in your lower belly (abdomen).  You have a nagging pain in your belly area.  You continue to feel sick to your stomach (nauseous), throw up (vomit), or have watery poop (diarrhea).  You have bad smelling fluid coming from your vagina.  You have pain with peeing (urination). Get help right away if:  You have a fever.  You are leaking fluid from your vagina.  You have spotting or bleeding from your vagina.  You have severe belly cramping or pain.  You lose or gain weight rapidly.  You have trouble catching your breath and have chest pain.  You notice sudden or extreme puffiness (swelling) of your face, hands, ankles, feet, or legs.  You have not felt the baby move in over an hour.  You have severe headaches that do not go away with medicine.  You have vision changes. This information is not intended to replace advice given  to you by your health care provider. Make sure you discuss any questions you have with your health care provider. Document Released: 07/05/2009 Document Revised: 09/16/2015 Document Reviewed:  06/11/2012 Elsevier Interactive Patient Education  2017 [place].   Common Medications Safe in Pregnancy  Acne:      Constipation:  Benzoyl Peroxide     Colace  Clindamycin      Dulcolax Suppository  Topica Erythromycin     Fibercon  Salicylic Acid      Metamucil         Miralax AVOID:        Senakot   Accutane    Cough:  Retin-A       Cough Drops  Tetracycline      Phenergan w/ Codeine if Rx  Minocycline      Robitussin (Plain & DM)  Antibiotics:     Crabs/Lice:  Ceclor       RID  Cephalosporins    AVOID:  E-Mycins      Kwell  Keflex  Macrobid/Macrodantin   Diarrhea:  Penicillin      Kao-Pectate  Zithromax      Imodium AD         PUSH FLUIDS AVOID:       Cipro     Fever:  Tetracycline      Tylenol (Regular or Extra  Minocycline       Strength)  Levaquin      Extra Strength-Do not          Exceed 8 tabs/24 hrs Caffeine:        <233m/day (equiv. To 1 cup of coffee or  approx. 3 12 oz sodas)         Gas: Cold/Hayfever:       Gas-X  Benadryl      Mylicon  Claritin       Phazyme  **Claritin-D        Chlor-Trimeton    Headaches:  Dimetapp      ASA-Free Excedrin  Drixoral-Non-Drowsy     Cold Compress  Mucinex (Guaifenasin)     Tylenol (Regular or Extra  Sudafed/Sudafed-12 Hour     Strength)  **Sudafed PE Pseudoephedrine   Tylenol Cold & Sinus     Vicks Vapor Rub  Zyrtec  **AVOID if Problems With Blood Pressure         Heartburn: Avoid lying down for at least 1 hour after meals  Aciphex      Maalox     Rash:  Milk of Magnesia     Benadryl    Mylanta       1% Hydrocortisone Cream  Pepcid  Pepcid Complete   Sleep Aids:  Prevacid      Ambien   Prilosec       Benadryl  Rolaids       Chamomile Tea  Tums (Limit 4/day)     Unisom         Tylenol PM         Warm milk-add vanilla or  Hemorrhoids:       Sugar for taste  Anusol/Anusol H.C.  (RX: Analapram 2.5%)  Sugar Substitutes:  Hydrocortisone OTC     Ok in moderation  Preparation  H      Tucks        Vaseline lotion applied to tissue with wiping    Herpes:     Throat:  Acyclovir      Oragel  Famvir  Valtrex     Vaccines:  Flu Shot Leg Cramps:       *Gardasil  Benadryl      Hepatitis A         Hepatitis B Nasal Spray:       Pneumovax  Saline Nasal Spray     Polio Booster         Tetanus Nausea:       Tuberculosis test or PPD  Vitamin B6 25 mg TID   AVOID:    Dramamine      *Gardasil  Emetrol       Live Poliovirus  Ginger Root 250 mg QID    MMR (measles, mumps &  High Complex Carbs @ Bedtime    rebella)  Sea Bands-Accupressure    Varicella (Chickenpox)  Unisom 1/2 tab TID     *No known complications           If received before Pain:         Known pregnancy;   Darvocet       Resume series after  Lortab        Delivery  Percocet    Yeast:   Tramadol      Femstat  Tylenol 3      Gyne-lotrimin  Ultram       Monistat  Vicodin           MISC:         All Sunscreens           Hair Coloring/highlights          Insect Repellant's          (Including DEET)         Mystic Tans WHAT OB PATIENTS CAN EXPECT   Confirmation of pregnancy and ultrasound ordered if medically indicated-[redacted] weeks gestation  New OB (NOB) intake with nurse and New OB (NOB) labs- [redacted] weeks gestation  New OB (NOB) physical examination with provider- 11/[redacted] weeks gestation  Flu vaccine-[redacted] weeks gestation  Anatomy scan-[redacted] weeks gestation  Glucose tolerance test, blood work to test for anemia, T-dap vaccine-[redacted] weeks gestation  Vaginal swabs/cultures-STD/Group B strep-[redacted] weeks gestation  Appointments every 4 weeks until 28 weeks  Every 2 weeks from 28 weeks until 36 weeks  Weekly visits from 36 weeks until delivery

## 2018-03-11 NOTE — Progress Notes (Signed)
Patient here for NOB PE.

## 2018-03-11 NOTE — Progress Notes (Signed)
NEW OB HISTORY AND PHYSICAL  SUBJECTIVE:       Regina Gray is a 27 y.o. G2P0010 female, Patient's last menstrual period was 12/09/2017 (exact date)., Estimated Date of Delivery: 09/16/18, [id number], presents today for establishment of Prenatal Care.  Reports daily nausea and vomiting, fatigue, and occasional muscle spasms.   Denies difficulty breathing or respiratory distress, chest pain, abdominal pain, vaginal bleeding, dysuria, and leg pain or swelling.   Desire genetic screening. Accompanied by spouse.    Gynecologic History  Patient's last menstrual period was 12/09/2017 (exact date).   Contraception: none   Last Pap: 10/2016. Results were: normal  Obstetric History OB History  Gravida Para Term Preterm AB Living  2       1    SAB TAB Ectopic Multiple Live Births  1            # Outcome Date GA Lbr Len/2nd Weight Sex Delivery Anes PTL Lv  2 Current           1 SAB 2014            No past medical history on file.  Past Surgical History:  Procedure Laterality Date  . ANTERIOR CRUCIATE LIGAMENT REPAIR    . APPENDECTOMY    . WISDOM TOOTH EXTRACTION      Current Outpatient Medications on File Prior to Visit  Medication Sig Dispense Refill  . Doxylamine-Pyridoxine ER (BONJESTA) 20-20 MG TBCR Take 20 mg by mouth daily.    . Multiple Vitamin (MULTIVITAMIN) tablet Take 1 tablet by mouth daily.    . Prenatal Vit-Fe Fumarate-FA (PRENATAL MULTIVITAMIN) TABS tablet Take 1 tablet by mouth daily at 12 noon.    . ranitidine (ZANTAC) 150 MG tablet Take 150 mg by mouth 2 (two) times daily.     No current facility-administered medications on file prior to visit.     Allergies  Allergen Reactions  . Codeine   . Hydromorphone     Other reaction(s): Vomiting  . Oxycodone     Other reaction(s): Vomiting  . Sulfa Antibiotics     Social History   Socioeconomic History  . Marital status: Married    Spouse name: Not on file  . Number of children: Not on file  . Years  of education: Not on file  . Highest education level: Not on file  Occupational History  . Not on file  Social Needs  . Financial resource strain: Not on file  . Food insecurity:    Worry: Not on file    Inability: Not on file  . Transportation needs:    Medical: Not on file    Non-medical: Not on file  Tobacco Use  . Smoking status: Never Smoker  . Smokeless tobacco: Never Used  Substance and Sexual Activity  . Alcohol use: Not Currently    Comment: occas  . Drug use: No  . Sexual activity: Yes    Birth control/protection: None  Lifestyle  . Physical activity:    Days per week: Not on file    Minutes per session: Not on file  . Stress: Not on file  Relationships  . Social connections:    Talks on phone: Not on file    Gets together: Not on file    Attends religious service: Not on file    Active member of club or organization: Not on file    Attends meetings of clubs or organizations: Not on file    Relationship status: Not on file  .  Intimate partner violence:    Fear of current or ex partner: Not on file    Emotionally abused: Not on file    Physically abused: Not on file    Forced sexual activity: Not on file  Other Topics Concern  . Not on file  Social History Narrative  . Not on file    Family History  Problem Relation Age of Onset  . Heart attack Maternal Grandfather   . Healthy Mother   . Healthy Father   . Breast cancer Neg Hx   . Ovarian cancer Neg Hx   . Colon cancer Neg Hx   . Diabetes Neg Hx     The following portions of the patient's history were reviewed and updated as appropriate: allergies, current medications, past OB history, past medical history, past surgical history, past family history, past social history, and problem list.    OBJECTIVE:  BP 123/68   Pulse 61   Wt 172 lb 8 oz (78.2 kg)   LMP 12/09/2017 (Exact Date)   BMI 25.47 kg/m   Initial Physical Exam (New OB)  GENERAL APPEARANCE: alert, well appearing, in no apparent  distress  HEAD: normocephalic, atraumatic  MOUTH: mucous membranes moist, pharynx normal without lesions  THYROID: no thyromegaly or masses present  BREASTS: deferred, no complaints  LUNGS: clear to auscultation, no wheezes, rales or rhonchi, symmetric air entry  HEART: regular rate and rhythm, no murmurs  ABDOMEN: soft, nontender, nondistended, no abnormal masses, no epigastric pain and FHT present  EXTREMITIES: no redness or tenderness in the calves or thighs, no edema  SKIN: normal coloration and turgor, no rashes  LYMPH NODES: no adenopathy palpable  NEUROLOGIC: alert, oriented, normal speech, no focal findings or movement disorder noted  PELVIC EXAM: deferred, no complaints  ASSESSMENT: Normal pregnancy Rh positive Nausea and vomiting in pregnancy Reflux in pregnancy Desires genetic screening  PLAN: Rx: Protonix, see orders Bonjesta sample given Prenatal care New OB counseling: The patient has been given an overview regarding routine prenatal care. Recommendations regarding diet, weight gain, and exercise in pregnancy were given. Prenatal testing, optional genetic testing, and ultrasound use in pregnancy were reviewed.  Benefits of Breast Feeding were discussed. The patient is encouraged to consider nursing her baby post partum. See orders   Gunnar Bulla, CNM [facility], [facility] 03/11/18 11:15 AM

## 2018-03-12 LAB — CBC
Hematocrit: 36.7 % (ref 34.0–46.6)
Hemoglobin: 12.3 g/dL (ref 11.1–15.9)
MCH: 29.6 pg (ref 26.6–33.0)
MCHC: 33.5 g/dL (ref 31.5–35.7)
MCV: 88 fL (ref 79–97)
Platelets: 318 10*3/uL (ref 150–450)
RBC: 4.16 x10E6/uL (ref 3.77–5.28)
RDW: 13.1 % (ref 12.3–15.4)
WBC: 8.6 10*3/uL (ref 3.4–10.8)

## 2018-03-13 ENCOUNTER — Telehealth: Payer: Self-pay

## 2018-03-13 NOTE — Telephone Encounter (Signed)
Spoke with Morrie SheldonAshley at [facility] to start [place].  Morrie Sheldonshley to reach out to patient if further action is needed.  Left VM asking [name] to return my call to make her aware.

## 2018-03-14 ENCOUNTER — Telehealth: Payer: Self-pay | Admitting: Certified Nurse Midwife

## 2018-03-14 NOTE — Telephone Encounter (Signed)
The patient called and stated [facility] and would like to receive a call back if possible today. The pt did not disclose any other information. Please advise.

## 2018-03-18 NOTE — Telephone Encounter (Signed)
LMTRC

## 2018-03-26 ENCOUNTER — Encounter: Payer: Self-pay | Admitting: Certified Nurse Midwife

## 2018-03-27 NOTE — Telephone Encounter (Signed)
Sent MyChart message to let her know [facility] will be contacting her for mail-in order on [place].

## 2018-04-01 ENCOUNTER — Other Ambulatory Visit: Payer: Self-pay

## 2018-04-01 MED ORDER — ONDANSETRON 4 MG PO TBDP: 4.0000 mg | ORAL_TABLET | Freq: Three times a day (TID) | ORAL | 1 refills | Status: DC | PRN

## 2018-04-01 MED ORDER — PANTOPRAZOLE SODIUM 20 MG PO TBEC: 20.0000 mg | DELAYED_RELEASE_TABLET | Freq: Two times a day (BID) | ORAL | 1 refills | Status: DC

## 2018-04-04 ENCOUNTER — Telehealth: Payer: Self-pay | Admitting: Obstetrics and Gynecology

## 2018-04-04 NOTE — Telephone Encounter (Signed)
Called pt she had 1 episode of vaginal bleeding very light, advised pt to hydrate, and put on a pad and let us know

## 2018-04-04 NOTE — Telephone Encounter (Signed)
The patient called and stated that she would like to speak with a nurse in regards to her having a few concerns in regards to her experiencing some cramping and constipation and light pink spotting. The patient is very concerned and would like to speak with a nurse today if possible. Please advise.

## 2018-04-09 ENCOUNTER — Encounter: Payer: Self-pay | Admitting: Certified Nurse Midwife

## 2018-04-09 ENCOUNTER — Ambulatory Visit (INDEPENDENT_AMBULATORY_CARE_PROVIDER_SITE_OTHER): Payer: Commercial Managed Care - PPO | Admitting: Certified Nurse Midwife

## 2018-04-09 VITALS — BP 138/61 | HR 76 | Wt 174.0 lb

## 2018-04-09 DIAGNOSIS — Z3481 Encounter for supervision of other normal pregnancy, first trimester: Secondary | ICD-10-CM | POA: Diagnosis not present

## 2018-04-09 LAB — POCT URINALYSIS DIPSTICK OB
Bilirubin, UA: NEGATIVE
Blood, UA: NEGATIVE
GLUCOSE, UA: NEGATIVE
Ketones, UA: NEGATIVE
Leukocytes, UA: NEGATIVE
Nitrite, UA: NEGATIVE
POC,PROTEIN,UA: NEGATIVE
Spec Grav, UA: 1.015 (ref 1.010–1.025)
Urobilinogen, UA: 0.2 E.U./dL
pH, UA: 5 (ref 5.0–8.0)

## 2018-04-09 NOTE — Patient Instructions (Signed)

## 2018-04-09 NOTE — Progress Notes (Signed)
ROB doing well. Discussed fetal movement and anticipation of feeling movements. Answared all questions. Pt state she is feeling better since starting additional medications. Reviewed anatomy u/s at next visit. Discussed weight gain in pregnancy. Reassurance given. Follow up 3 wks.   Regina BurkeAnnie Ricci Paff, CNM

## 2018-04-24 NOTE — L&D Delivery Note (Signed)
Delivery Note  1535 In room to see patient, reports intermittent pelvic pressure and abdominal pain. SVE: 10/100/+1, vertex.   Effective maternal pushing efforts noted in various positions.   Spontaneous vaginal birth of liveborn female infant at 38 in left occiput anterior position. Infant immediately to maternal abdomen, short cord noted. Terminal meconium present. Delayed cord clamping, skin to skin, three (3) vessel cord. Middle and ring finger of left hand webbed, palate intact upon inspection. APGARs: 7, 9. Weight pending. Receiving nurse present at bedside for birth.   Pitocin bolus infusing through IV. Spontaneous delivery of intact placenta at 1705. Placenta to pathology for evaluation. Vaginal lacerations repaired with 3-0 vicryl rapide under epidural anesthesia. Lacerations well approximated and hemostatic. Uterus firm. Lochia small. Vault check completed. Counts correct x 2. QBL: 220 ml.  Initiate routine postpartum care and orders. Mom to postpartum.  Baby to Couplet care / Skin to Skin.  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    Gunnar Bulla, CNM [facility], [facility] 09/25/2018, 6:00 PM

## 2018-05-01 ENCOUNTER — Ambulatory Visit (INDEPENDENT_AMBULATORY_CARE_PROVIDER_SITE_OTHER): Payer: Commercial Managed Care - PPO

## 2018-05-01 ENCOUNTER — Ambulatory Visit (INDEPENDENT_AMBULATORY_CARE_PROVIDER_SITE_OTHER): Payer: Commercial Managed Care - PPO | Admitting: Obstetrics and Gynecology

## 2018-05-01 VITALS — BP 123/81 | HR 74 | Wt 178.6 lb

## 2018-05-01 DIAGNOSIS — Z363 Encounter for antenatal screening for malformations: Secondary | ICD-10-CM

## 2018-05-01 DIAGNOSIS — Z3492 Encounter for supervision of normal pregnancy, unspecified, second trimester: Secondary | ICD-10-CM

## 2018-05-01 DIAGNOSIS — Z3481 Encounter for supervision of other normal pregnancy, first trimester: Secondary | ICD-10-CM

## 2018-05-01 NOTE — Progress Notes (Signed)
ROB and anatomy scan; reviewed scan below   Indications:Anatomy Ultrasound Findings:  Singleton intrauterine pregnancy is visualized with FHR at 152 BPM. Biometrics give an (U/S) Gestational age of [redacted]w[redacted]d and an (U/S) EDD of 09/12/18; this correlates with the clinically established Estimated Date of Delivery: 09/16/18  Fetal presentation is Cephalic.  EFW: wnl. Placenta: anterior. And has a posterior accessory lobe  Grade: 0 AFI: subjectively normal.  Anatomic survey is complete and normal; Gender - female.    Right Ovary is normal in appearance. Left Ovary is normal appearance. Survey of the adnexa demonstrates no adnexal masses. There is no free peritoneal fluid in the cul de sac.  Impression: 1. [redacted]w[redacted]d Viable Singleton Intrauterine pregnancy by U/S. 2. (U/S) EDD is consistent with Clinically established Estimated Date of Delivery: 09/16/18 . 3. Normal Anatomy Scan 4. Placenta: anterior. And has a posterior accessory lobe

## 2018-05-01 NOTE — Progress Notes (Signed)
ROB- anatomy scan done today, pt is doing well 

## 2018-05-28 ENCOUNTER — Ambulatory Visit (INDEPENDENT_AMBULATORY_CARE_PROVIDER_SITE_OTHER): Payer: Commercial Managed Care - PPO | Admitting: Certified Nurse Midwife

## 2018-05-28 VITALS — BP 132/73 | HR 87 | Wt 187.0 lb

## 2018-05-28 DIAGNOSIS — Z130 Encounter for screening for diseases of the blood and blood-forming organs and certain disorders involving the immune mechanism: Secondary | ICD-10-CM

## 2018-05-28 DIAGNOSIS — Z3492 Encounter for supervision of normal pregnancy, unspecified, second trimester: Secondary | ICD-10-CM

## 2018-05-28 DIAGNOSIS — Z131 Encounter for screening for diabetes mellitus: Secondary | ICD-10-CM

## 2018-05-28 DIAGNOSIS — O43192 Other malformation of placenta, second trimester: Secondary | ICD-10-CM

## 2018-05-28 DIAGNOSIS — Z113 Encounter for screening for infections with a predominantly sexual mode of transmission: Secondary | ICD-10-CM

## 2018-05-28 NOTE — Patient Instructions (Signed)
WHAT OB PATIENTS CAN EXPECT   Confirmation of pregnancy and ultrasound ordered if medically indicated-[redacted] weeks gestation  New OB (NOB) intake with nurse and New OB (NOB) labs- [redacted] weeks gestation  New OB (NOB) physical examination with provider- 11/[redacted] weeks gestation  Flu vaccine-[redacted] weeks gestation  Anatomy scan-[redacted] weeks gestation  Glucose tolerance test, blood work to test for anemia, T-dap vaccine-[redacted] weeks gestation  Vaginal swabs/cultures-STD/Group B strep-[redacted] weeks gestation  Appointments every 4 weeks until 28 weeks  Every 2 weeks from 28 weeks until 36 weeks  Weekly visits from 36 weeks until delivery  Third Trimester of Pregnancy  The third trimester is from week 28 through week 40 (months 7 through 9). This trimester is when your unborn baby (fetus) is growing very fast. At the end of the ninth month, the unborn baby is about 20 inches in length. It weighs about 6-10 pounds. Follow these instructions at home: Medicines  Take over-the-counter and prescription medicines only as told by your doctor. Some medicines are safe and some medicines are not safe during pregnancy.  Take a prenatal vitamin that contains at least 600 micrograms (mcg) of folic acid.  If you have trouble pooping (constipation), take medicine that will make your stool soft (stool softener) if your doctor approves. Eating and drinking   Eat regular, healthy meals.  Avoid raw meat and uncooked cheese.  If you get low calcium from the food you eat, talk to your doctor about taking a daily calcium supplement.  Eat four or five small meals rather than three large meals a day.  Avoid foods that are high in fat and sugars, such as fried and sweet foods.  To prevent constipation: ? Eat foods that are high in fiber, like fresh fruits and vegetables, whole grains, and beans. ? Drink enough fluids to keep your pee (urine) clear or pale yellow. Activity  Exercise only as told by your doctor. Stop  exercising if you start to have cramps.  Avoid heavy lifting, wear low heels, and sit up straight.  Do not exercise if it is too hot, too humid, or if you are in a place of great height (high altitude).  You may continue to have sex unless your doctor tells you not to. Relieving pain and discomfort  Wear a good support bra if your breasts are tender.  Take frequent breaks and rest with your legs raised if you have leg cramps or low back pain.  Take warm water baths (sitz baths) to soothe pain or discomfort caused by hemorrhoids. Use hemorrhoid cream if your doctor approves.  If you develop puffy, bulging veins (varicose veins) in your legs: ? Wear support hose or compression stockings as told by your doctor. ? Raise (elevate) your feet for 15 minutes, 3-4 times a day. ? Limit salt in your food. Safety  Wear your seat belt when driving.  Make a list of emergency phone numbers, including numbers for family, friends, the hospital, and police and fire departments. Preparing for your baby's arrival To prepare for the arrival of your baby:  Take prenatal classes.  Practice driving to the hospital.  Visit the hospital and tour the maternity area.  Talk to your work about taking leave once the baby comes.  Pack your hospital bag.  Prepare the baby's room.  Go to your doctor visits.  Buy a rear-facing car seat. Learn how to install it in your car. General instructions  Do not use hot tubs, steam rooms, or saunas.    Do not use any products that contain nicotine or tobacco, such as cigarettes and e-cigarettes. If you need help quitting, ask your doctor.  Do not drink alcohol.  Do not douche or use tampons or scented sanitary pads.  Do not cross your legs for long periods of time.  Do not travel for long distances unless you must. Only do so if your doctor says it is okay.  Visit your dentist if you have not gone during your pregnancy. Use a soft toothbrush to brush your  teeth. Be gentle when you floss.  Avoid cat litter boxes and soil used by cats. These carry germs that can cause birth defects in the baby and can cause a loss of your baby (miscarriage) or stillbirth.  Keep all your prenatal visits as told by your doctor. This is important. Contact a doctor if:  You are not sure if you are in labor or if your water has broken.  You are dizzy.  You have mild cramps or pressure in your lower belly.  You have a nagging pain in your belly area.  You continue to feel sick to your stomach, you throw up, or you have watery poop.  You have bad smelling fluid coming from your vagina.  You have pain when you pee. Get help right away if:  You have a fever.  You are leaking fluid from your vagina.  You are spotting or bleeding from your vagina.  You have severe belly cramps or pain.  You lose or gain weight quickly.  You have trouble catching your breath and have chest pain.  You notice sudden or extreme puffiness (swelling) of your face, hands, ankles, feet, or legs.  You have not felt the baby move in over an hour.  You have severe headaches that do not go away with medicine.  You have trouble seeing.  You are leaking, or you are having a gush of fluid, from your vagina before you are 37 weeks.  You have regular belly spasms (contractions) before you are 37 weeks. Summary  The third trimester is from week 28 through week 40 (months 7 through 9). This time is when your unborn baby is growing very fast.  Follow your doctor's advice about medicine, food, and activity.  Get ready for the arrival of your baby by taking prenatal classes, getting all the baby items ready, preparing the baby's room, and visiting your doctor to be checked.  Get help right away if you are bleeding from your vagina, or you have chest pain and trouble catching your breath, or if you have not felt your baby move in over an hour. This information is not intended to  replace advice given to you by your health care provider. Make sure you discuss any questions you have with your health care provider. Document Released: 07/05/2009 Document Revised: 05/16/2016 Document Reviewed: 05/16/2016 Elsevier Interactive Patient Education  2019 [employer]  

## 2018-05-28 NOTE — Progress Notes (Signed)
ROB- Patient is concerned about weight gain.

## 2018-05-28 NOTE — Progress Notes (Signed)
ROB-Reports rounding ligament pain. Discussed home treatment measures including use of abdominal support and [employer]. Education regarding weight gain in pregnancy. Encouraged childbirth class; scheduled given. Illustrated Birth Choices handout given. Anticipatory guidance regarding course of prenatal care. Reviewed red flag symptoms and when to call. RTC x 4 weeks for 28 week labs and ROB or sooner if needed.

## 2018-05-30 ENCOUNTER — Encounter: Payer: Self-pay | Admitting: Certified Nurse Midwife

## 2018-05-30 ENCOUNTER — Other Ambulatory Visit: Payer: Self-pay

## 2018-05-30 MED ORDER — ONDANSETRON 4 MG PO TBDP: 4.0000 mg | ORAL_TABLET | Freq: Three times a day (TID) | ORAL | 1 refills | Status: DC | PRN

## 2018-06-26 ENCOUNTER — Ambulatory Visit (INDEPENDENT_AMBULATORY_CARE_PROVIDER_SITE_OTHER): Payer: Commercial Managed Care - PPO | Admitting: Certified Nurse Midwife

## 2018-06-26 ENCOUNTER — Other Ambulatory Visit: Payer: Commercial Managed Care - PPO

## 2018-06-26 ENCOUNTER — Encounter: Payer: Self-pay | Admitting: Certified Nurse Midwife

## 2018-06-26 VITALS — BP 125/71 | HR 82 | Wt 193.1 lb

## 2018-06-26 DIAGNOSIS — Z3492 Encounter for supervision of normal pregnancy, unspecified, second trimester: Secondary | ICD-10-CM

## 2018-06-26 DIAGNOSIS — Z131 Encounter for screening for diabetes mellitus: Secondary | ICD-10-CM

## 2018-06-26 DIAGNOSIS — Z3403 Encounter for supervision of normal first pregnancy, third trimester: Secondary | ICD-10-CM | POA: Diagnosis not present

## 2018-06-26 DIAGNOSIS — Z130 Encounter for screening for diseases of the blood and blood-forming organs and certain disorders involving the immune mechanism: Secondary | ICD-10-CM

## 2018-06-26 DIAGNOSIS — Z113 Encounter for screening for infections with a predominantly sexual mode of transmission: Secondary | ICD-10-CM

## 2018-06-26 LAB — POCT URINALYSIS DIPSTICK OB
Bilirubin, UA: NEGATIVE
Blood, UA: NEGATIVE
Glucose, UA: NEGATIVE
Ketones, UA: NEGATIVE
Leukocytes, UA: NEGATIVE
Nitrite, UA: NEGATIVE
PH UA: 5 (ref 5.0–8.0)
POC,PROTEIN,UA: NEGATIVE
Spec Grav, UA: 1.015 (ref 1.010–1.025)
Urobilinogen, UA: 0.2 E.U./dL

## 2018-06-26 MED ORDER — TETANUS-DIPHTH-ACELL PERTUSSIS 5-2.5-18.5 LF-MCG/0.5 IM SUSP
0.5000 mL | Freq: Once | INTRAMUSCULAR | Status: AC
  Administered 2018-06-26: 0.5 mL via INTRAMUSCULAR

## 2018-06-26 NOTE — Patient Instructions (Signed)
Td Vaccine (Tetanus and Diphtheria): What You Need to Know °1. Why get vaccinated? °Tetanus  and diphtheria are very serious diseases. They are rare in the [place] today, but people who do become infected often have severe complications. Td vaccine is used to protect adolescents and adults from both of these diseases. °Both tetanus and diphtheria are infections caused by bacteria. Diphtheria spreads from person to person through coughing or sneezing. Tetanus-causing bacteria enter the body through cuts, scratches, or wounds. °TETANUS (Lockjaw) causes painful muscle tightening and stiffness, usually all over the body. °· It can lead to tightening of muscles in the head and neck so you can't open your mouth, swallow, or sometimes even breathe. Tetanus kills about 1 out of every 10 people who are infected even after receiving the best medical care. °DIPHTHERIA can cause a thick coating to form in the back of the throat. °· It can lead to breathing problems, paralysis, heart failure, and death. °Before vaccines, as many as 200,000 cases of diphtheria and hundreds of cases of tetanus were reported in the [place] each year. Since vaccination began, reports of cases for both diseases have dropped by about 99%. °2. Td vaccine °Td vaccine can protect adolescents and adults from tetanus and diphtheria. Td is usually given as a booster dose every 10 years but it can also be given earlier after a severe and dirty wound or burn. °Another vaccine, called Tdap, which protects against pertussis in addition to tetanus and diphtheria, is sometimes recommended instead of Td vaccine. °Your doctor or the person giving you the vaccine can give you more information. °Td may safely be given at the same time as other vaccines. °3. Some people should not get this vaccine °· A person who has ever had a life-threatening allergic reaction after a previous dose of any tetanus or diphtheria containing vaccine, OR has a severe allergy  to any part of this vaccine, should not get Td vaccine. Tell the person giving the vaccine about any severe allergies. °· Talk to your doctor if you: °? had severe pain or swelling after any vaccine containing diphtheria or tetanus, °? ever had a condition called Guillain Barré Syndrome (GBS), °? aren't feeling well on the day the shot is scheduled. °4. Risks of a vaccine reaction °With any medicine, including vaccines, there is a chance of side effects. These are usually mild and go away on their own. Serious reactions are also possible but are rare. °Most people who get Td vaccine do not have any problems with it. °Mild Problems following Td vaccine: °(Did not interfere with activities) °· Pain where the shot was given (about 8 people in 10) °· Redness or swelling where the shot was given (about 1 person in 4) °· Mild fever (rare) °· Headache (about 1 person in 4) °· Tiredness (about 1 person in 4) °Moderate Problems following Td vaccine: °(Interfered with activities, but did not require medical attention) °· Fever over 102°F (rare) °Severe Problems following Td vaccine: °(Unable to perform usual activities; required medical attention) °· Swelling, severe pain, bleeding and/or redness in the arm where the shot was given (rare). °Problems that could happen after any vaccine: °· People sometimes faint after a medical procedure, including vaccination. Sitting or lying down for about 15 minutes can help prevent fainting, and injuries caused by a fall. Tell your doctor if you feel dizzy, or have vision changes or ringing in the ears. °· Some people get severe pain in the shoulder and have   difficulty moving the arm where a shot was given. This happens very rarely. °· Any medication can cause a severe allergic reaction. Such reactions from a vaccine are very rare, estimated at fewer than 1 in a million doses, and would happen within a few minutes to a few hours after the vaccination. °As with any medicine, there is a  very remote chance of a vaccine causing a serious injury or death. °The safety of vaccines is always being monitored. For more information, visit: www.cdc.gov/vaccinesafety/ °5. What if there is a serious reaction? °What should I look for? °· Look for anything that concerns you, such as signs of a severe allergic reaction, very high fever, or unusual behavior. °Signs of a severe allergic reaction can include hives, swelling of the face and throat, difficulty breathing, a fast heartbeat, dizziness, and weakness. These would usually start a few minutes to a few hours after the vaccination. °What should I do? °· If you think it is a severe allergic reaction or other emergency that can't wait, call 9-1-1 or get the person to the nearest hospital. Otherwise, call your doctor. °· Afterward, the reaction should be reported to the Vaccine Adverse Event Reporting System (VAERS). Your doctor might file this report, or you can do it yourself through the VAERS web site at www.vaers.hhs.gov, or by calling 1-800-822-7967. °VAERS does not give medical advice. °6. The National Vaccine Injury Compensation Program °The National Vaccine Injury Compensation Program (VICP) is a federal program that was created to compensate people who may have been injured by certain vaccines. °Persons who believe they may have been injured by a vaccine can learn about the program and about filing a claim by calling 1-800-338-2382 or visiting the VICP website at www.hrsa.gov/vaccinecompensation. There is a time limit to file a claim for compensation. °7. How can I learn more? °· Ask your doctor. He or she can give you the vaccine package insert or suggest other sources of information. °· Call your local or state health department. °· Contact the Centers for Disease Control and Prevention (CDC): °? Call 1-800-232-4636 (1-800-CDC-INFO) °? Visit CDC's website at www.cdc.gov/vaccines °Vaccine Information Statement Td Vaccine (08/03/15) °This information is  not intended to replace advice given to you by your health care provider. Make sure you discuss any questions you have with your health care provider. °Document Released: 02/05/2006 Document Revised: 11/26/2017 Document Reviewed: 11/26/2017 °Elsevier Interactive Patient Education © 2019 [employer] °Glucose Tolerance Test During Pregnancy °Why am I having this test? °The glucose tolerance test (GTT) is done to check how your body processes sugar (glucose). This is one of several tests used to diagnose diabetes that develops during pregnancy (gestational diabetes mellitus). Gestational diabetes is a temporary form of diabetes that some women develop during pregnancy. It usually occurs during the second trimester of pregnancy and goes away after delivery. °Testing (screening) for gestational diabetes usually occurs between 24 and 28 weeks of pregnancy. You may have the GTT test after having a 1-hour glucose screening test if the results from that test indicate that you may have gestational diabetes. You may also have this test if: °· You have a history of gestational diabetes. °· You have a history of giving birth to very large babies or have experienced repeated fetal loss (stillbirth). °· You have signs and symptoms of diabetes, such as: °? Changes in your vision. °? Tingling or numbness in your hands or feet. °? Changes in hunger, thirst, and urination that are not otherwise explained by your pregnancy. °  What is being tested? °This test measures the amount of glucose in your blood at different times during a period of 3 hours. This indicates how well your body is able to process glucose. °What kind of sample is taken? ° °Blood samples are required for this test. They are usually collected by inserting a needle into a blood vessel. °How do I prepare for this test? °· For 3 days before your test, eat normally. Have plenty of carbohydrate-rich foods. °· Follow instructions from your health care provider  about: °? Eating or drinking restrictions on the day of the test. You may be asked to not eat or drink anything other than water (fast) starting 8-10 hours before the test. °? Changing or stopping your regular medicines. Some medicines may interfere with this test. °Tell a health care provider about: °· All medicines you are taking, including vitamins, herbs, eye drops, creams, and over-the-counter medicines. °· Any blood disorders you have. °· Any surgeries you have had. °· Any medical conditions you have. °What happens during the test? °First, your blood glucose will be measured. This is referred to as your fasting blood glucose, since you fasted before the test. Then, you will drink a glucose solution that contains a certain amount of glucose. Your blood glucose will be measured again 1, 2, and 3 hours after drinking the solution. °This test takes about 3 hours to complete. You will need to stay at the testing location during this time. During the testing period: °· Do not eat or drink anything other than the glucose solution. °· Do not exercise. °· Do not use any products that contain nicotine or tobacco, such as cigarettes and e-cigarettes. If you need help stopping, ask your health care provider. °The testing procedure may vary among health care providers and hospitals. °How are the results reported? °Your results will be reported as milligrams of glucose per deciliter of blood (mg/dL) or millimoles per liter (mmol/L). Your health care provider will compare your results to normal ranges that were established after testing a large group of people (reference ranges). Reference ranges may vary among labs and hospitals. For this test, common reference ranges are: °· Fasting: less than 95-105 mg/dL (5.3-5.8 mmol/L). °· 1 hour after drinking glucose: less than 180-190 mg/dL (10.0-10.5 mmol/L). °· 2 hours after drinking glucose: less than 155-165 mg/dL (8.6-9.2 mmol/L). °· 3 hours after drinking glucose: 140-145  mg/dL (7.8-8.1 mmol/L). °What do the results mean? °Results within reference ranges are considered normal, meaning that your glucose levels are well-controlled. If two or more of your blood glucose levels are high, you may be diagnosed with gestational diabetes. If only one level is high, your health care provider may suggest repeat testing or other tests to confirm a diagnosis. °Talk with your health care provider about what your results mean. °Questions to ask your health care provider °Ask your health care provider, or the department that is doing the test: °· When will my results be ready? °· How will I get my results? °· What are my treatment options? °· What other tests do I need? °· What are my next steps? °Summary °· The glucose tolerance test (GTT) is one of several tests used to diagnose diabetes that develops during pregnancy (gestational diabetes mellitus). Gestational diabetes is a temporary form of diabetes that some women develop during pregnancy. °· You may have the GTT test after having a 1-hour glucose screening test if the results from that test indicate that you may have gestational diabetes.   You may also have this test if you have any symptoms or risk factors for gestational diabetes. °· Talk with your health care provider about what your results mean. °This information is not intended to replace advice given to you by your health care provider. Make sure you discuss any questions you have with your health care provider. °Document Released: 10/10/2011 Document Revised: 11/20/2016 Document Reviewed: 11/20/2016 °Elsevier Interactive Patient Education © 2019 [employer]. ° °

## 2018-06-26 NOTE — Progress Notes (Signed)
ROB doing well. Labs today. BTC/CBC/TDAP/RPR/ glucose screen . Discussed BC after baby. Planning skyla. Discussed [facility] doula program. Pamphlet given. Sample birth plan given. Pt continues to have issues with indigestion. She has not taken the Prilosec as suggested. Pt encouraged to try this. She verbalizes understanding and agrees to plan. Follow up 2 wks.   Doreene Burke, CNM

## 2018-06-27 ENCOUNTER — Encounter: Payer: Self-pay | Admitting: Certified Nurse Midwife

## 2018-06-27 LAB — CBC
Hematocrit: 32.6 % — ABNORMAL LOW (ref 34.0–46.6)
Hemoglobin: 10.9 g/dL — ABNORMAL LOW (ref 11.1–15.9)
MCH: 30 pg (ref 26.6–33.0)
MCHC: 33.4 g/dL (ref 31.5–35.7)
MCV: 90 fL (ref 79–97)
PLATELETS: 242 10*3/uL (ref 150–450)
RBC: 3.63 x10E6/uL — AB (ref 3.77–5.28)
RDW: 11.7 % (ref 11.7–15.4)
WBC: 7.2 10*3/uL (ref 3.4–10.8)

## 2018-06-27 LAB — GLUCOSE, 1 HOUR GESTATIONAL: Gestational Diabetes Screen: 149 mg/dL — ABNORMAL HIGH (ref 65–139)

## 2018-06-27 LAB — RPR: RPR Ser Ql: NONREACTIVE

## 2018-06-28 ENCOUNTER — Encounter: Payer: Self-pay | Admitting: Certified Nurse Midwife

## 2018-06-28 ENCOUNTER — Other Ambulatory Visit: Payer: Self-pay | Admitting: Certified Nurse Midwife

## 2018-06-28 DIAGNOSIS — O99019 Anemia complicating pregnancy, unspecified trimester: Secondary | ICD-10-CM | POA: Insufficient documentation

## 2018-06-28 DIAGNOSIS — R7309 Other abnormal glucose: Secondary | ICD-10-CM

## 2018-06-28 DIAGNOSIS — Z3403 Encounter for supervision of normal first pregnancy, third trimester: Secondary | ICD-10-CM

## 2018-06-28 DIAGNOSIS — Z131 Encounter for screening for diabetes mellitus: Secondary | ICD-10-CM

## 2018-06-28 NOTE — Progress Notes (Signed)
Thanks

## 2018-07-04 ENCOUNTER — Other Ambulatory Visit: Payer: Self-pay

## 2018-07-04 ENCOUNTER — Other Ambulatory Visit: Payer: Commercial Managed Care - PPO

## 2018-07-04 DIAGNOSIS — Z131 Encounter for screening for diabetes mellitus: Secondary | ICD-10-CM

## 2018-07-04 DIAGNOSIS — Z3403 Encounter for supervision of normal first pregnancy, third trimester: Secondary | ICD-10-CM

## 2018-07-04 DIAGNOSIS — R7309 Other abnormal glucose: Secondary | ICD-10-CM

## 2018-07-05 LAB — GESTATIONAL GLUCOSE TOLERANCE
GLUCOSE 2 HOUR GTT: 145 mg/dL (ref 65–154)
Glucose, Fasting: 77 mg/dL (ref 65–94)
Glucose, GTT - 1 Hour: 162 mg/dL (ref 65–179)
Glucose, GTT - 3 Hour: 50 mg/dL — ABNORMAL LOW (ref 65–139)

## 2018-07-10 ENCOUNTER — Ambulatory Visit (INDEPENDENT_AMBULATORY_CARE_PROVIDER_SITE_OTHER): Payer: Commercial Managed Care - PPO | Admitting: Obstetrics and Gynecology

## 2018-07-10 ENCOUNTER — Other Ambulatory Visit: Payer: Self-pay

## 2018-07-10 VITALS — BP 120/77 | HR 88 | Wt 197.7 lb

## 2018-07-10 DIAGNOSIS — Z3493 Encounter for supervision of normal pregnancy, unspecified, third trimester: Secondary | ICD-10-CM

## 2018-07-10 LAB — POCT URINALYSIS DIPSTICK OB
Bilirubin, UA: NEGATIVE
Blood, UA: NEGATIVE
Glucose, UA: NEGATIVE
Ketones, UA: NEGATIVE
Leukocytes, UA: NEGATIVE
Nitrite, UA: NEGATIVE
POC,PROTEIN,UA: NEGATIVE
Spec Grav, UA: 1.015
Urobilinogen, UA: 0.2 U/dL
pH, UA: 6.5

## 2018-07-10 NOTE — Progress Notes (Signed)
ROB- pt is doing well, did have some dry heaving this am

## 2018-07-10 NOTE — Progress Notes (Signed)
ROB- nausea and vomiting has returned. Encouraged to restart bonjesta, taking zofran and it helps.  States all classes @ [facility] were cancelled through April                                                   `                                     ``

## 2018-07-23 ENCOUNTER — Other Ambulatory Visit: Payer: Self-pay | Admitting: Certified Nurse Midwife

## 2018-07-25 ENCOUNTER — Encounter: Payer: Commercial Managed Care - PPO | Admitting: Certified Nurse Midwife

## 2018-07-25 NOTE — Progress Notes (Signed)
Coronavirus (COVID-19) Are you at risk?  Are you at risk for the Coronavirus (COVID-19)?  To be considered HIGH RISK for Coronavirus (COVID-19), you have to meet the following criteria:  . Traveled to [place], [place], [place], [place] or [place]; or in the [place] to [place], [place], [place], or [place]; and have fever, cough, and shortness of breath within the last 2 weeks of travel OR . Been in close contact with a person diagnosed with COVID-19 within the last 2 weeks and have fever, cough, and shortness of breath . IF YOU DO NOT MEET THESE CRITERIA, YOU ARE CONSIDERED LOW RISK FOR COVID-19.  What to do if you are HIGH RISK for COVID-19?  . If you are having a medical emergency, call 911. . Seek medical care right away. Before you go to a doctor's office, urgent care or emergency department, call ahead and tell them about your recent travel, contact with someone diagnosed with COVID-19, and your symptoms. You should receive instructions from your physician's office regarding next steps of care.  . When you arrive at healthcare provider, tell the healthcare staff immediately you have returned from visiting [place], [place], [place], [place] or [place]; or traveled in the [place] to [place], [place], [place], or [place]; in the last two weeks or you have been in close contact with a person diagnosed with COVID-19 in the last 2 weeks.   . Tell the health care staff about your symptoms: fever, cough and shortness of breath. . After you have been seen by a medical provider, you will be either: o Tested for (COVID-19) and discharged home on quarantine except to seek medical care if symptoms worsen, and asked to  - Stay home and avoid contact with others until you get your results (4-5 days)  - Avoid travel on public transportation if possible (such as bus, train, or airplane) or o Sent to the Emergency Department by EMS for evaluation, COVID-19 testing, and possible  admission depending on your condition and test results.  What to do if you are LOW RISK for COVID-19?  Reduce your risk of any infection by using the same precautions used for avoiding the common cold or flu:  . Wash your hands often with soap and warm water for at least 20 seconds.  If soap and water are not readily available, use an alcohol-based hand sanitizer with at least 60% alcohol.  . If coughing or sneezing, cover your mouth and nose by coughing or sneezing into the elbow areas of your shirt or coat, into a tissue or into your sleeve (not your hands). . Avoid shaking hands with others and consider head nods or verbal greetings only. . Avoid touching your eyes, nose, or mouth with unwashed hands.  . Avoid close contact with people who are [place]. . Avoid places or events with large numbers of people in one location, like concerts or sporting events. . Carefully consider travel plans you have or are making. . If you are planning any travel outside or inside the [place], visit the CDC's Travelers' Health webpage for the latest health notices. . If you have some symptoms but not all symptoms, continue to monitor at home and seek medical attention if your symptoms worsen. . If you are having a medical emergency, call 911. 07/25/18 screening negative sls  ADDITIONAL HEALTHCARE OPTIONS FOR PATIENTS  [facility] / e-Visit: [email or web address]         [facility]: [phone]    [facility]: [phone]                   [facility]: [phone]  

## 2018-07-26 ENCOUNTER — Other Ambulatory Visit (HOSPITAL_COMMUNITY)
Admission: RE | Admit: 2018-07-26 | Discharge: 2018-07-26 | Disposition: A | Discharge status: Home / Self Care | Payer: Commercial Managed Care - PPO | Source: Ambulatory Visit | Attending: Certified Nurse Midwife | Admitting: Certified Nurse Midwife

## 2018-07-26 ENCOUNTER — Ambulatory Visit (INDEPENDENT_AMBULATORY_CARE_PROVIDER_SITE_OTHER): Payer: Commercial Managed Care - PPO | Admitting: Certified Nurse Midwife

## 2018-07-26 ENCOUNTER — Other Ambulatory Visit: Payer: Self-pay

## 2018-07-26 VITALS — BP 117/69 | HR 86 | Wt 195.4 lb

## 2018-07-26 DIAGNOSIS — N898 Other specified noninflammatory disorders of vagina: Secondary | ICD-10-CM | POA: Insufficient documentation

## 2018-07-26 DIAGNOSIS — Z3493 Encounter for supervision of normal pregnancy, unspecified, third trimester: Secondary | ICD-10-CM | POA: Diagnosis present

## 2018-07-26 DIAGNOSIS — O219 Vomiting of pregnancy, unspecified: Secondary | ICD-10-CM

## 2018-07-26 DIAGNOSIS — O26893 Other specified pregnancy related conditions, third trimester: Secondary | ICD-10-CM | POA: Insufficient documentation

## 2018-07-26 LAB — POCT URINALYSIS DIPSTICK OB
Bilirubin, UA: NEGATIVE
Blood, UA: NEGATIVE
Glucose, UA: NEGATIVE
Ketones, UA: NEGATIVE
Leukocytes, UA: NEGATIVE
Nitrite, UA: NEGATIVE
POC,PROTEIN,UA: NEGATIVE
Spec Grav, UA: 1.02 (ref 1.010–1.025)
Urobilinogen, UA: 0.2 E.U./dL
pH, UA: 6 (ref 5.0–8.0)

## 2018-07-26 MED ORDER — TERCONAZOLE 0.4 % VA CREA: 1.0000 | TOPICAL_CREAM | Freq: Every day | VAGINAL | 0 refills | Status: DC

## 2018-07-26 MED ORDER — PROMETHAZINE HCL 25 MG PO TABS: 25.0000 mg | ORAL_TABLET | Freq: Four times a day (QID) | ORAL | 2 refills | Status: DC | PRN

## 2018-07-26 NOTE — Patient Instructions (Addendum)
Third Trimester of Pregnancy  The third trimester is from week 28 through week 40 (months 7 through 9). This trimester is when your unborn baby (fetus) is growing very fast. At the end of the ninth month, the unborn baby is about 20 inches in length. It weighs about 6-10 pounds. Follow these instructions at home: Medicines  Take over-the-counter and prescription medicines only as told by your doctor. Some medicines are safe and some medicines are not safe during pregnancy.  Take a prenatal vitamin that contains at least 600 micrograms (mcg) of folic acid.  If you have trouble pooping (constipation), take medicine that will make your stool soft (stool softener) if your doctor approves. Eating and drinking   Eat regular, healthy meals.  Avoid raw meat and uncooked cheese.  If you get low calcium from the food you eat, talk to your doctor about taking a daily calcium supplement.  Eat four or five small meals rather than three large meals a day.  Avoid foods that are high in fat and sugars, such as fried and sweet foods.  To prevent constipation: ? Eat foods that are high in fiber, like fresh fruits and vegetables, whole grains, and beans. ? Drink enough fluids to keep your pee (urine) clear or pale yellow. Activity  Exercise only as told by your doctor. Stop exercising if you start to have cramps.  Avoid heavy lifting, wear low heels, and sit up straight.  Do not exercise if it is too hot, too humid, or if you are in a place of great height (high altitude).  You may continue to have sex unless your doctor tells you not to. Relieving pain and discomfort  Wear a good support bra if your breasts are tender.  Take frequent breaks and rest with your legs raised if you have leg cramps or low back pain.  Take warm water baths (sitz baths) to soothe pain or discomfort caused by hemorrhoids. Use hemorrhoid cream if your doctor approves.  If you develop puffy, bulging veins (varicose  veins) in your legs: ? Wear support hose or compression stockings as told by your doctor. ? Raise (elevate) your feet for 15 minutes, 3-4 times a day. ? Limit salt in your food. Safety  Wear your seat belt when driving.  Make a list of emergency phone numbers, including numbers for family, friends, the hospital, and police and fire departments. Preparing for your baby's arrival To prepare for the arrival of your baby:  Take prenatal classes.  Practice driving to the hospital.  Visit the hospital and tour the maternity area.  Talk to your work about taking leave once the baby comes.  Pack your hospital bag.  Prepare the baby's room.  Go to your doctor visits.  Buy a rear-facing car seat. Learn how to install it in your car. General instructions  Do not use hot tubs, steam rooms, or saunas.  Do not use any products that contain nicotine or tobacco, such as cigarettes and e-cigarettes. If you need help quitting, ask your doctor.  Do not drink alcohol.  Do not douche or use tampons or scented sanitary pads.  Do not cross your legs for long periods of time.  Do not travel for long distances unless you must. Only do so if your doctor says it is okay.  Visit your dentist if you have not gone during your pregnancy. Use a soft toothbrush to brush your teeth. Be gentle when you floss.  Avoid cat litter boxes and soil   used by cats. These carry germs that can cause birth defects in the baby and can cause a loss of your baby (miscarriage) or stillbirth.  Keep all your prenatal visits as told by your doctor. This is important. Contact a doctor if:  You are not sure if you are in labor or if your water has broken.  You are dizzy.  You have mild cramps or pressure in your lower belly.  You have a nagging pain in your belly area.  You continue to feel sick to your stomach, you throw up, or you have watery poop.  You have bad smelling fluid coming from your vagina.  You have  pain when you pee. Get help right away if:  You have a fever.  You are leaking fluid from your vagina.  You are spotting or bleeding from your vagina.  You have severe belly cramps or pain.  You lose or gain weight quickly.  You have trouble catching your breath and have chest pain.  You notice sudden or extreme puffiness (swelling) of your face, hands, ankles, feet, or legs.  You have not felt the baby move in over an hour.  You have severe headaches that do not go away with medicine.  You have trouble seeing.  You are leaking, or you are having a gush of fluid, from your vagina before you are 37 weeks.  You have regular belly spasms (contractions) before you are 37 weeks. Summary  The third trimester is from week 28 through week 40 (months 7 through 9). This time is when your unborn baby is growing very fast.  Follow your doctor's advice about medicine, food, and activity.  Get ready for the arrival of your baby by taking prenatal classes, getting all the baby items ready, preparing the baby's room, and visiting your doctor to be checked.  Get help right away if you are bleeding from your vagina, or you have chest pain and trouble catching your breath, or if you have not felt your baby move in over an hour. This information is not intended to replace advice given to you by your health care provider. Make sure you discuss any questions you have with your health care provider. Document Released: 07/05/2009 Document Revised: 05/16/2016 Document Reviewed: 05/16/2016 Elsevier Interactive Patient Education  2019 [employer]   Fetal Movement Counts Patient Name: ________________________________________________ Patient Due Date: ____________________ What is a fetal movement count?  A fetal movement count is the number of times that you feel your baby move during a certain amount of time. This may also be called a fetal kick count. A fetal movement count is recommended for  every pregnant woman. You may be asked to start counting fetal movements as early as week 28 of your pregnancy. Pay attention to when your baby is most active. You may notice your baby's sleep and wake cycles. You may also notice things that make your baby move more. You should do a fetal movement count:  When your baby is normally most active.  At the same time each day. A good time to count movements is while you are resting, after having something to eat and drink. How do I count fetal movements? 1. Find a quiet, comfortable area. Sit, or lie down on your side. 2. Write down the date, the start time and stop time, and the number of movements that you felt between those two times. Take this information with you to your health care visits. 3. For 2 hours, count  kicks, flutters, swishes, rolls, and jabs. You should feel at least 10 movements during 2 hours. 4. You may stop counting after you have felt 10 movements. 5. If you do not feel 10 movements in 2 hours, have something to eat and drink. Then, keep resting and counting for 1 hour. If you feel at least 4 movements during that hour, you may stop counting. Contact a health care provider if:  You feel fewer than 4 movements in 2 hours.  Your baby is not moving like he or she usually does. Date: ____________ Start time: ____________ Stop time: ____________ Movements: ____________ Date: ____________ Start time: ____________ Stop time: ____________ Movements: ____________ Date: ____________ Start time: ____________ Stop time: ____________ Movements: ____________ Date: ____________ Start time: ____________ Stop time: ____________ Movements: ____________ Date: ____________ Start time: ____________ Stop time: ____________ Movements: ____________ Date: ____________ Start time: ____________ Stop time: ____________ Movements: ____________ Date: ____________ Start time: ____________ Stop time: ____________ Movements: ____________ Date: ____________  Start time: ____________ Stop time: ____________ Movements: ____________ Date: ____________ Start time: ____________ Stop time: ____________ Movements: ____________ This information is not intended to replace advice given to you by your health care provider. Make sure you discuss any questions you have with your health care provider. Document Released: 05/10/2006 Document Revised: 12/08/2015 Document Reviewed: 05/20/2015 Elsevier Interactive Patient Education  2019 [employer]  Morning Sickness  Morning sickness is when you feel sick to your stomach (nauseous) during pregnancy. You may feel sick to your stomach and throw up (vomit). You may feel sick in the morning, but you can feel this way at any time of day. Some women feel very sick to their stomach and cannot stop throwing up (hyperemesis gravidarum). Follow these instructions at home: Medicines  Take over-the-counter and prescription medicines only as told by your doctor. Do not take any medicines until you talk with your doctor about them first.  Taking multivitamins before getting pregnant can stop or lessen the harshness of morning sickness. Eating and drinking  Eat dry toast or crackers before getting out of bed.  Eat 5 or 6 small meals a day.  Eat dry and bland foods like rice and baked potatoes.  Do not eat greasy, fatty, or spicy foods.  Have someone cook for you if the smell of food causes you to feel sick or throw up.  If you feel sick to your stomach after taking prenatal vitamins, take them at night or with a snack.  Eat protein when you need a snack. Nuts, yogurt, and cheese are good choices.  Drink fluids throughout the day.  Try ginger ale made with real ginger, ginger tea made from fresh grated ginger, or ginger candies. General instructions  Do not use any products that have nicotine or tobacco in them, such as cigarettes and e-cigarettes. If you need help quitting, ask your doctor.  Use an air purifier  to keep the air in your house free of smells.  Get lots of fresh air.  Try to avoid smells that make you feel sick.  Try: ? Wearing a bracelet that is used for seasickness (acupressure wristband). ? Going to a doctor who puts thin needles into certain body points (acupuncture) to improve how you feel. Contact a doctor if:  You need medicine to feel better.  You feel dizzy or light-headed.  You are losing weight. Get help right away if:  You feel very sick to your stomach and cannot stop throwing up.  You pass out (faint).  You have  very bad pain in your belly. Summary  Morning sickness is when you feel sick to your stomach (nauseous) during pregnancy.  You may feel sick in the morning, but you can feel this way at any time of day.  Making some changes to what you eat may help your symptoms go away. This information is not intended to replace advice given to you by your health care provider. Make sure you discuss any questions you have with your health care provider. Document Released: 05/18/2004 Document Revised: 05/11/2016 Document Reviewed: 05/11/2016 Elsevier Interactive Patient Education  2019 [employer].

## 2018-07-26 NOTE — Progress Notes (Signed)
ROB-Reports increased vaginal discharge. Nuswab collected, see orders. Rx: Terazole, see orders. Nausea has returned taking Zofran and Prilosec will add Phenergan, rx sent to pharmacy on file. Discussed COVID restrictions and precautions. Education regarding online childbirth education options. Anticipatory guidance regarding course of prenatal care. Reviewed red flag symptoms and when to call. RTC x 4 weeks for 36 week cultures and ROB or sooner if needed.

## 2018-07-29 LAB — CERVICOVAGINAL ANCILLARY ONLY
Bacterial vaginitis: NEGATIVE
Candida vaginitis: POSITIVE — AB

## 2018-07-30 ENCOUNTER — Encounter: Payer: Self-pay | Admitting: Certified Nurse Midwife

## 2018-08-08 ENCOUNTER — Encounter: Payer: Self-pay | Admitting: Certified Nurse Midwife

## 2018-08-09 ENCOUNTER — Other Ambulatory Visit: Payer: Self-pay | Admitting: *Deleted

## 2018-08-09 MED ORDER — ONDANSETRON 4 MG PO TBDP: 4.0000 mg | ORAL_TABLET | Freq: Three times a day (TID) | ORAL | 1 refills | Status: DC | PRN

## 2018-08-21 ENCOUNTER — Telehealth: Payer: Self-pay

## 2018-08-21 NOTE — Telephone Encounter (Signed)
Coronavirus (COVID-19) Are you at risk?  Are you at risk for the Coronavirus (COVID-19)?  To be considered HIGH RISK for Coronavirus (COVID-19), you have to meet the following criteria:  . Traveled to [place], [place], [place], [place] or [place]; or in the [place] to [place], [place], [place], or [place]; and have fever, cough, and shortness of breath within the last 2 weeks of travel OR . Been in close contact with a person diagnosed with COVID-19 within the last 2 weeks and have fever, cough, and shortness of breath . IF YOU DO NOT MEET THESE CRITERIA, YOU ARE CONSIDERED LOW RISK FOR COVID-19.  What to do if you are HIGH RISK for COVID-19?  . If you are having a medical emergency, call 911. . Seek medical care right away. Before you go to a doctor's office, urgent care or emergency department, call ahead and tell them about your recent travel, contact with someone diagnosed with COVID-19, and your symptoms. You should receive instructions from your physician's office regarding next steps of care.  . When you arrive at healthcare provider, tell the healthcare staff immediately you have returned from visiting [place], [place], [place], [place] or [place]; or traveled in the [place] to [place], [place], [place], or [place]; in the last two weeks or you have been in close contact with a person diagnosed with COVID-19 in the last 2 weeks.   . Tell the health care staff about your symptoms: fever, cough and shortness of breath. . After you have been seen by a medical provider, you will be either: o Tested for (COVID-19) and discharged home on quarantine except to seek medical care if symptoms worsen, and asked to  - Stay home and avoid contact with others until you get your results (4-5 days)  - Avoid travel on public transportation if possible (such as bus, train, or airplane) or o Sent to the Emergency Department by EMS for evaluation, COVID-19 testing, and possible  admission depending on your condition and test results.  What to do if you are LOW RISK for COVID-19?  Reduce your risk of any infection by using the same precautions used for avoiding the common cold or flu:  . Wash your hands often with soap and warm water for at least 20 seconds.  If soap and water are not readily available, use an alcohol-based hand sanitizer with at least 60% alcohol.  . If coughing or sneezing, cover your mouth and nose by coughing or sneezing into the elbow areas of your shirt or coat, into a tissue or into your sleeve (not your hands). . Avoid shaking hands with others and consider head nods or verbal greetings only. . Avoid touching your eyes, nose, or mouth with unwashed hands.  . Avoid close contact with people who are Lister Brizzi. . Avoid places or events with large numbers of people in one location, like concerts or sporting events. . Carefully consider travel plans you have or are making. . If you are planning any travel outside or inside the [place], visit the CDC's Travelers' Health webpage for the latest health notices. . If you have some symptoms but not all symptoms, continue to monitor at home and seek medical attention if your symptoms worsen. . If you are having a medical emergency, call 911.  08/21/18 SCREENING NEG SLS ADDITIONAL HEALTHCARE OPTIONS FOR PATIENTS  [facility] / e-Visit: [email or web address]         [facility]: [phone]    [facility]: [phone]                   [facility]: [phone]  

## 2018-08-22 ENCOUNTER — Other Ambulatory Visit: Payer: Self-pay

## 2018-08-22 ENCOUNTER — Ambulatory Visit (INDEPENDENT_AMBULATORY_CARE_PROVIDER_SITE_OTHER): Payer: Commercial Managed Care - PPO | Admitting: Certified Nurse Midwife

## 2018-08-22 VITALS — BP 108/77 | HR 85 | Wt 203.0 lb

## 2018-08-22 DIAGNOSIS — Z3483 Encounter for supervision of other normal pregnancy, third trimester: Secondary | ICD-10-CM

## 2018-08-22 DIAGNOSIS — Z3685 Encounter for antenatal screening for Streptococcus B: Secondary | ICD-10-CM

## 2018-08-22 DIAGNOSIS — Z113 Encounter for screening for infections with a predominantly sexual mode of transmission: Secondary | ICD-10-CM

## 2018-08-22 LAB — POCT URINALYSIS DIPSTICK OB
Bilirubin, UA: NEGATIVE
Blood, UA: NEGATIVE
Glucose, UA: NEGATIVE
Ketones, UA: NEGATIVE
Leukocytes, UA: NEGATIVE
Nitrite, UA: NEGATIVE
POC,PROTEIN,UA: NEGATIVE
Spec Grav, UA: 1.01 (ref 1.010–1.025)
Urobilinogen, UA: 0.2 E.U./dL
pH, UA: 6 (ref 5.0–8.0)

## 2018-08-22 NOTE — Addendum Note (Signed)
Addended by: Rosine Beat L on: 08/22/2018 02:32 PM   Modules accepted: Orders

## 2018-08-22 NOTE — Progress Notes (Signed)
Patient here for ROB, c/o constant lower pelvic "soreness" x1 week and bilateral hand numbness.

## 2018-08-22 NOTE — Progress Notes (Signed)
ROB-Reports pelvic pressure, restless legs nightly and intermittent bilateral hand numbness. Discussed home treatment measures. 36 wk cultures collected. Discussed COVID restrictions and precautions. Pre-labor checklist and herbal prep guide given. Anticipatory guidance regarding course of prenatal care. Reviewed red flag symptoms and when to call. RTC x 2 weeks for ROB or sooner if needed.

## 2018-08-22 NOTE — Patient Instructions (Signed)
Fetal Movement Counts  Patient Name: ________________________________________________ Patient Due Date: ____________________  What is a fetal movement count?    A fetal movement count is the number of times that you feel your baby move during a certain amount of time. This may also be called a fetal kick count. A fetal movement count is recommended for every pregnant woman. You may be asked to start counting fetal movements as early as week 28 of your pregnancy.  Pay attention to when your baby is most active. You may notice your baby's sleep and wake cycles. You may also notice things that make your baby move more. You should do a fetal movement count:  · When your baby is normally most active.  · At the same time each day.  A good time to count movements is while you are resting, after having something to eat and drink.  How do I count fetal movements?  1. Find a quiet, comfortable area. Sit, or lie down on your side.  2. Write down the date, the start time and stop time, and the number of movements that you felt between those two times. Take this information with you to your health care visits.  3. For 2 hours, count kicks, flutters, swishes, rolls, and jabs. You should feel at least 10 movements during 2 hours.  4. You may stop counting after you have felt 10 movements.  5. If you do not feel 10 movements in 2 hours, have something to eat and drink. Then, keep resting and counting for 1 hour. If you feel at least 4 movements during that hour, you may stop counting.  Contact a health care provider if:  · You feel fewer than 4 movements in 2 hours.  · Your baby is not moving like he or she usually does.  Date: ____________ Start time: ____________ Stop time: ____________ Movements: ____________  Date: ____________ Start time: ____________ Stop time: ____________ Movements: ____________  Date: ____________ Start time: ____________ Stop time: ____________ Movements: ____________  Date: ____________ Start time:  ____________ Stop time: ____________ Movements: ____________  Date: ____________ Start time: ____________ Stop time: ____________ Movements: ____________  Date: ____________ Start time: ____________ Stop time: ____________ Movements: ____________  Date: ____________ Start time: ____________ Stop time: ____________ Movements: ____________  Date: ____________ Start time: ____________ Stop time: ____________ Movements: ____________  Date: ____________ Start time: ____________ Stop time: ____________ Movements: ____________  This information is not intended to replace advice given to you by your health care provider. Make sure you discuss any questions you have with your health care provider.  Document Released: 05/10/2006 Document Revised: 12/08/2015 Document Reviewed: 05/20/2015  Elsevier Interactive Patient Education © 2019 [employer]  Vaginal Delivery    Vaginal delivery means that you give birth by pushing your baby out of your birth canal (vagina). A team of health care providers will help you before, during, and after vaginal delivery. Birth experiences are unique for every woman and every pregnancy, and birth experiences vary depending on where you choose to give birth.  What happens when I arrive at the birth center or hospital?  Once you are in labor and have been admitted into the hospital or birth center, your health care provider may:  · Review your pregnancy history and any concerns that you have.  · Insert an IV into one of your veins. This may be used to give you fluids and medicines.  · Check your blood pressure, pulse, temperature, and heart rate (vital signs).  ·   Check whether your bag of water (amniotic sac) has broken (ruptured).  · Talk with you about your birth plan and discuss pain control options.  Monitoring  Your health care provider may monitor your contractions (uterine monitoring) and your baby's heart rate (fetal monitoring). You may need to be monitored:  · Often, but not continuously  (intermittently).  · All the time or for long periods at a time (continuously). Continuous monitoring may be needed if:  ? You are taking certain medicines, such as medicine to relieve pain or make your contractions stronger.  ? You have pregnancy or labor complications.  Monitoring may be done by:  · Placing a special stethoscope or a handheld monitoring device on your abdomen to check your baby's heartbeat and to check for contractions.  · Placing monitors on your abdomen (external monitors) to record your baby's heartbeat and the frequency and length of contractions.  · Placing monitors inside your uterus through your vagina (internal monitors) to record your baby's heartbeat and the frequency, length, and strength of your contractions. Depending on the type of monitor, it may remain in your uterus or on your baby's head until birth.  · Telemetry. This is a type of continuous monitoring that can be done with external or internal monitors. Instead of having to stay in bed, you are able to move around during telemetry.  Physical exam  Your health care provider may perform frequent physical exams. This may include:  · Checking how and where your baby is positioned in your uterus.  · Checking your cervix to determine:  ? Whether it is thinning out (effacing).  ? Whether it is opening up (dilating).  What happens during labor and delivery?    Normal labor and delivery is divided into the following three stages:  Stage 1  · This is the longest stage of labor.  · This stage can last for hours or days.  · Throughout this stage, you will feel contractions. Contractions generally feel mild, infrequent, and irregular at first. They get stronger, more frequent (about every 2-3 minutes), and more regular as you move through this stage.  · This stage ends when your cervix is completely dilated to 4 inches (10 cm) and completely effaced.  Stage 2  · This stage starts once your cervix is completely effaced and dilated and lasts  until the delivery of your baby.  · This stage may last from 20 minutes to 2 hours.  · This is the stage where you will feel an urge to push your baby out of your vagina.  · You may feel stretching and burning pain, especially when the widest part of your baby's head passes through the vaginal opening (crowning).  · Once your baby is delivered, the umbilical cord will be clamped and cut. This usually occurs after waiting a period of 1-2 minutes after delivery.  · Your baby will be placed on your bare chest (skin-to-skin contact) in an upright position and covered with a warm blanket. Watch your baby for feeding cues, like rooting or sucking, and help the baby to your breast for his or her first feeding.  Stage 3  · This stage starts immediately after the birth of your baby and ends after you deliver the placenta.  · This stage may take anywhere from 5 to 30 minutes.  · After your baby has been delivered, you will feel contractions as your body expels the placenta and your uterus contracts to control bleeding.  What can   I expect after labor and delivery?  · After labor is over, you and your baby will be monitored closely until you are ready to go home to ensure that you are both healthy. Your health care team will teach you how to care for yourself and your baby.  · You and your baby will stay in the same room (rooming in) during your hospital stay. This will encourage early bonding and successful breastfeeding.  · You may continue to receive fluids and medicines through an IV.  · Your uterus will be checked and massaged regularly (fundal massage).  · You will have some soreness and pain in your abdomen, vagina, and the area of skin between your vaginal opening and your anus (perineum).  · If an incision was made near your vagina (episiotomy) or if you had some vaginal tearing during delivery, cold compresses may be placed on your episiotomy or your tear. This helps to reduce pain and swelling.  · You may be given a  squirt bottle to use instead of wiping when you go to the bathroom. To use the squirt bottle, follow these steps:  ? Before you urinate, fill the squirt bottle with warm water. Do not use hot water.  ? After you urinate, while you are sitting on the toilet, use the squirt bottle to rinse the area around your urethra and vaginal opening. This rinses away any urine and blood.  ? Fill the squirt bottle with clean water every time you use the bathroom.  · It is normal to have vaginal bleeding after delivery. Wear a sanitary pad for vaginal bleeding and discharge.  Summary  · Vaginal delivery means that you will give birth by pushing your baby out of your birth canal (vagina).  · Your health care provider may monitor your contractions (uterine monitoring) and your baby's heart rate (fetal monitoring).  · Your health care provider may perform a physical exam.  · Normal labor and delivery is divided into three stages.  · After labor is over, you and your baby will be monitored closely until you are ready to go home.  This information is not intended to replace advice given to you by your health care provider. Make sure you discuss any questions you have with your health care provider.  Document Released: 01/18/2008 Document Revised: 05/15/2017 Document Reviewed: 05/15/2017  Elsevier Interactive Patient Education © 2019 [employer]

## 2018-08-24 LAB — STREP GP B NAA: Strep Gp B NAA: POSITIVE — AB

## 2018-08-25 ENCOUNTER — Encounter: Payer: Self-pay | Admitting: Certified Nurse Midwife

## 2018-08-25 DIAGNOSIS — B951 Streptococcus, group B, as the cause of diseases classified elsewhere: Secondary | ICD-10-CM | POA: Insufficient documentation

## 2018-08-25 LAB — GC/CHLAMYDIA PROBE AMP
Chlamydia trachomatis, NAA: NEGATIVE
Neisseria Gonorrhoeae by PCR: NEGATIVE

## 2018-08-27 ENCOUNTER — Encounter: Payer: Self-pay | Admitting: Certified Nurse Midwife

## 2018-09-04 ENCOUNTER — Telehealth: Payer: Self-pay | Admitting: *Deleted

## 2018-09-04 NOTE — Telephone Encounter (Signed)
Coronavirus (COVID-19) Are you at risk?  Are you at risk for the Coronavirus (COVID-19)?  To be considered HIGH RISK for Coronavirus (COVID-19), you have to meet the following criteria:  . Traveled to [place], [place], [place], [place] or [place]; or in the [place] to [place], [place], [place], or [place]; and have fever, cough, and shortness of breath within the last 2 weeks of travel OR . Been in close contact with a person diagnosed with COVID-19 within the last 2 weeks and have fever, cough, and shortness of breath . IF YOU DO NOT MEET THESE CRITERIA, YOU ARE CONSIDERED LOW RISK FOR COVID-19.  What to do if you are HIGH RISK for COVID-19?  . If you are having a medical emergency, call 911. . Seek medical care right away. Before you go to a doctor's office, urgent care or emergency department, call ahead and tell them about your recent travel, contact with someone diagnosed with COVID-19, and your symptoms. You should receive instructions from your physician's office regarding next steps of care.  . When you arrive at healthcare provider, tell the healthcare staff immediately you have returned from visiting [place], [place], [place], [place] or [place]; or traveled in the [place] to [place], [place], [place], or [place]; in the last two weeks or you have been in close contact with a person diagnosed with COVID-19 in the last 2 weeks.   . Tell the health care staff about your symptoms: fever, cough and shortness of breath. . After you have been seen by a medical provider, you will be either: o Tested for (COVID-19) and discharged home on quarantine except to seek medical care if symptoms worsen, and asked to  - Stay home and avoid contact with others until you get your results (4-5 days)  - Avoid travel on public transportation if possible (such as bus, train, or airplane) or o Sent to the Emergency Department by EMS for evaluation, COVID-19 testing, and possible  admission depending on your condition and test results.  What to do if you are LOW RISK for COVID-19?  Reduce your risk of any infection by using the same precautions used for avoiding the common cold or flu:  . Wash your hands often with soap and warm water for at least 20 seconds.  If soap and water are not readily available, use an alcohol-based hand sanitizer with at least 60% alcohol.  . If coughing or sneezing, cover your mouth and nose by coughing or sneezing into the elbow areas of your shirt or coat, into a tissue or into your sleeve (not your hands). . Avoid shaking hands with others and consider head nods or verbal greetings only. . Avoid touching your eyes, nose, or mouth with unwashed hands.  . Avoid close contact with people who are sick. . Avoid places or events with large numbers of people in one location, like concerts or sporting events. . Carefully consider travel plans you have or are making. . If you are planning any travel outside or inside the [place], visit the CDC's Travelers' Health webpage for the latest health notices. . If you have some symptoms but not all symptoms, continue to monitor at home and seek medical attention if your symptoms worsen. . If you are having a medical emergency, call 911.   ADDITIONAL HEALTHCARE OPTIONS FOR PATIENTS  [facility] / e-Visit: [email or web address]         [facility]: [phone]  [facility]: [phone]                   [facility]: [phone]   Spoke with pt denies any sx.  Amy Clontz, CMA 

## 2018-09-05 ENCOUNTER — Ambulatory Visit (INDEPENDENT_AMBULATORY_CARE_PROVIDER_SITE_OTHER): Payer: Commercial Managed Care - PPO | Admitting: Certified Nurse Midwife

## 2018-09-05 ENCOUNTER — Other Ambulatory Visit: Payer: Self-pay

## 2018-09-05 VITALS — BP 123/89 | HR 103 | Wt 202.5 lb

## 2018-09-05 DIAGNOSIS — Z3483 Encounter for supervision of other normal pregnancy, third trimester: Secondary | ICD-10-CM

## 2018-09-05 NOTE — Progress Notes (Signed)
ROB-Report intermittent nausea with occasional vomiting and heartburn. Notes rash to right buttocks. Discussed home treatment measures. Advised of GBS+ status, verbalized understanding. Pre-labor checklist and [employer] given. Anticipatory guidance regarding course of prenatal care. Reviewed red flag symptoms and when to call. RTC x 1 week for ROB or sooner if needed.

## 2018-09-05 NOTE — Patient Instructions (Addendum)

## 2018-09-06 ENCOUNTER — Encounter: Payer: Self-pay | Admitting: Certified Nurse Midwife

## 2018-09-06 ENCOUNTER — Other Ambulatory Visit: Payer: Self-pay | Admitting: *Deleted

## 2018-09-06 MED ORDER — ONDANSETRON 4 MG PO TBDP: 4.0000 mg | ORAL_TABLET | Freq: Three times a day (TID) | ORAL | 1 refills | Status: DC | PRN

## 2018-09-10 ENCOUNTER — Encounter: Payer: Self-pay | Admitting: Certified Nurse Midwife

## 2018-09-13 ENCOUNTER — Ambulatory Visit (INDEPENDENT_AMBULATORY_CARE_PROVIDER_SITE_OTHER): Payer: Commercial Managed Care - PPO | Admitting: Certified Nurse Midwife

## 2018-09-13 ENCOUNTER — Other Ambulatory Visit: Payer: Self-pay

## 2018-09-13 VITALS — BP 111/85 | HR 82 | Wt 203.6 lb

## 2018-09-13 DIAGNOSIS — O480 Post-term pregnancy: Secondary | ICD-10-CM

## 2018-09-13 DIAGNOSIS — Z3483 Encounter for supervision of other normal pregnancy, third trimester: Secondary | ICD-10-CM

## 2018-09-13 LAB — POCT URINALYSIS DIPSTICK OB
Bilirubin, UA: NEGATIVE
Blood, UA: NEGATIVE
Glucose, UA: NEGATIVE
Ketones, UA: NEGATIVE
Leukocytes, UA: NEGATIVE
Nitrite, UA: NEGATIVE
POC,PROTEIN,UA: NEGATIVE
Spec Grav, UA: 1.01 (ref 1.010–1.025)
Urobilinogen, UA: 0.2 E.U./dL
pH, UA: 6 (ref 5.0–8.0)

## 2018-09-13 NOTE — Patient Instructions (Signed)

## 2018-09-14 NOTE — Progress Notes (Signed)
ROB-Feeling "okay", ready to have baby. Using herbal prep. Discussed postdates care. Reviewed red flag symptoms and when to call. RTC x 1 week for BPP, growth ultrasound and ROB or sooner if needed.

## 2018-09-16 ENCOUNTER — Inpatient Hospital Stay: Admit: 2018-09-16 | Payer: Self-pay

## 2018-09-18 ENCOUNTER — Telehealth: Payer: Self-pay

## 2018-09-18 NOTE — Telephone Encounter (Signed)
Coronavirus (COVID-19) Are you at risk?  Are you at risk for the Coronavirus (COVID-19)?  To be considered HIGH RISK for Coronavirus (COVID-19), you have to meet the following criteria:  . Traveled to [place], [place], [place], [place] or [place]; or in the [place] to [place], [place], [place], or [place]; and have fever, cough, and shortness of breath within the last 2 weeks of travel OR . Been in close contact with a person diagnosed with COVID-19 within the last 2 weeks and have fever, cough, and shortness of breath . IF YOU DO NOT MEET THESE CRITERIA, YOU ARE CONSIDERED LOW RISK FOR COVID-19.  What to do if you are HIGH RISK for COVID-19?  . If you are having a medical emergency, call 911. . Seek medical care right away. Before you go to a doctor's office, urgent care or emergency department, call ahead and tell them about your recent travel, contact with someone diagnosed with COVID-19, and your symptoms. You should receive instructions from your physician's office regarding next steps of care.  . When you arrive at healthcare provider, tell the healthcare staff immediately you have returned from visiting [place], [place], [place], [place] or [place]; or traveled in the [place] to [place], [place], [place], or [place]; in the last two weeks or you have been in close contact with a person diagnosed with COVID-19 in the last 2 weeks.   . Tell the health care staff about your symptoms: fever, cough and shortness of breath. . After you have been seen by a medical provider, you will be either: o Tested for (COVID-19) and discharged home on quarantine except to seek medical care if symptoms worsen, and asked to  - Stay home and avoid contact with others until you get your results (4-5 days)  - Avoid travel on public transportation if possible (such as bus, train, or airplane) or o Sent to the Emergency Department by EMS for evaluation, COVID-19 testing, and possible  admission depending on your condition and test results.  What to do if you are LOW RISK for COVID-19?  Reduce your risk of any infection by using the same precautions used for avoiding the common cold or flu:  . Wash your hands often with soap and warm water for at least 20 seconds.  If soap and water are not readily available, use an alcohol-based hand sanitizer with at least 60% alcohol.  . If coughing or sneezing, cover your mouth and nose by coughing or sneezing into the elbow areas of your shirt or coat, into a tissue or into your sleeve (not your hands). . Avoid shaking hands with others and consider head nods or verbal greetings only. . Avoid touching your eyes, nose, or mouth with unwashed hands.  . Avoid close contact with people who are sick. . Avoid places or events with large numbers of people in one location, like concerts or sporting events. . Carefully consider travel plans you have or are making. . If you are planning any travel outside or inside the [place], visit the CDC's Travelers' Health webpage for the latest health notices. . If you have some symptoms but not all symptoms, continue to monitor at home and seek medical attention if your symptoms worsen. . If you are having a medical emergency, call 911.   ADDITIONAL HEALTHCARE OPTIONS FOR PATIENTS  [facility] / e-Visit: [email or web address]         [facility]: [phone]  [facility]: [phone]                   [facility]: [phone]   Pre-screen negative, DM.   

## 2018-09-19 ENCOUNTER — Ambulatory Visit (INDEPENDENT_AMBULATORY_CARE_PROVIDER_SITE_OTHER): Payer: Commercial Managed Care - PPO

## 2018-09-19 ENCOUNTER — Ambulatory Visit (INDEPENDENT_AMBULATORY_CARE_PROVIDER_SITE_OTHER): Payer: Commercial Managed Care - PPO | Admitting: Certified Nurse Midwife

## 2018-09-19 ENCOUNTER — Other Ambulatory Visit: Payer: Self-pay

## 2018-09-19 VITALS — BP 120/81 | HR 91 | Wt 205.6 lb

## 2018-09-19 DIAGNOSIS — Z3A40 40 weeks gestation of pregnancy: Secondary | ICD-10-CM

## 2018-09-19 DIAGNOSIS — O480 Post-term pregnancy: Secondary | ICD-10-CM

## 2018-09-19 DIAGNOSIS — Z3493 Encounter for supervision of normal pregnancy, unspecified, third trimester: Secondary | ICD-10-CM

## 2018-09-19 DIAGNOSIS — Z3483 Encounter for supervision of other normal pregnancy, third trimester: Secondary | ICD-10-CM

## 2018-09-19 LAB — POCT URINALYSIS DIPSTICK OB
Bilirubin, UA: NEGATIVE
Blood, UA: NEGATIVE
Glucose, UA: NEGATIVE
Ketones, UA: NEGATIVE
Leukocytes, UA: NEGATIVE
Nitrite, UA: NEGATIVE
POC,PROTEIN,UA: NEGATIVE
Spec Grav, UA: 1.01 (ref 1.010–1.025)
Urobilinogen, UA: 0.2 E.U./dL
pH, UA: 6.5 (ref 5.0–8.0)

## 2018-09-19 NOTE — Progress Notes (Signed)
ROB-Reports intermittent back pain. Discussed home treatment measures. Encouraged pumping and other labor preparation techniques. Requests SVE, unchanged from previous exam. BPP today 8/8; ultrasound findings reviewed with patient, verbalized understanding. Anticipatory guidance regarding postdates care. Induction of labor scheduled 09/25/18 at 0800. Advised twice daily kick counts. Reviewed red flag symptoms and when to call. RTC x Monday for NST and ROB or sooner if needed.   ULTRASOUND REPORT  Location: [facility] Date of Service: 09/19/2018   Indications:growth/afi Findings:  M[facility] Jim intrauterine pregnancy is visualized with FHR at 140 BPM. Biometrics give an (U/S) Gestational age of 104w5d and an (U/S) EDD of 09/21/2018; this correlates with the clinically established Estimated Date of Delivery: 09/16/18.   Fetal presentation is Cephalic.  Placenta: posterior. Grade: 2 AFI: 5.7 cm  BPP Scoring: Movement: 2/2  Tone: 2/2  Breathing: 2/2  AFI: 2/2 1` Growth percentile is 67. EFW: 3783 g ( 8 lbs 5 oz )   Impression: 1. [redacted]w[redacted]d Viable [facility]leton Intrauterine pregnancy previously established criteria. 2. Growth is 67 %ile.  AFI is 5.7 cm. 3. BPP is 8/8    Recommendations: 1.Clinical correlation with the patient's History and Physical Exam.

## 2018-09-19 NOTE — Patient Instructions (Addendum)
Nonstress Test A nonstress test is a procedure that is done during pregnancy in order to check the baby's heartbeat. The procedure can help show if the baby (fetus) is healthy. It is commonly done when:  The baby is past his or her due date.  The pregnancy is high risk.  The baby is moving less than normal.  The mother has lost a pregnancy in the past.  The health care provider suspects a problem with the baby's growth.  There is too much or too little amniotic fluid. The procedure is often done in the third trimester of pregnancy to find out if an early delivery is needed and whether such a delivery is safe. During a nonstress test, the baby's heartbeat is monitored when the baby is resting and when the baby is moving. If the baby is healthy, the heart rate will increase when he or she moves or kicks and will return to normal when he or she rests. Tell a health care provider about:  Any allergies you have.  Any medical conditions you have.  All medicines you are taking, including vitamins, herbs, eye drops, creams, and over-the-counter medicines. What are the risks? There are no risks to you or your baby from a nonstress test. This procedure should not be painful or uncomfortable. What happens before the procedure?  Eat a meal right before the test or as directed by your health care provider. Food may help encourage the baby to move.  Use the restroom right before the test. What happens during the procedure?  Two monitors will be placed on your abdomen. One will record the baby's heart rate and the other will record the contractions of your uterus.  You may be asked to lie down on your side or to sit upright.  You may be given a button to press when you feel your baby move.  Your health care provider will listen to your baby's heartbeat and recorded it. He or she may also watch your baby's heartbeat on a screen.  If the baby seems to be sleeping, you may be asked to drink  some juice or soda, eat a snack, or change positions. The procedure may vary among health care providers and hospitals. What happens after the procedure?  Your health care provider will discuss the test results with you and make recommendations for the future. Depending on the results, your health care provider may order additional tests or another course of action.  If your health care provider gave you any diet or activity instructions, make sure to follow them.  Keep all follow-up visits as told by your health care provider. This is important. Summary  A nonstress test is a procedure that is done during pregnancy in order to check the baby's heartbeat. The procedure can help show if the baby is healthy.  The procedure is often done in the third trimester of pregnancy to find out if an early delivery is needed and whether such a delivery is safe.  During a nonstress test, the baby's heartbeat is monitored when the baby is resting and when the baby is moving. If the baby is healthy, the heart rate will increase when he or she moves or kicks and will return to normal when he or she rests.  Your health care provider will discuss the test results with you and make recommendations for the future. This information is not intended to replace advice given to you by your health care provider. Make sure you discuss any   questions you have with your health care provider. Document Released: 03/31/2002 Document Revised: 07/20/2016 Document Reviewed: 07/20/2016 Elsevier Interactive Patient Education  2019 [employer] Back Pain in Pregnancy Back pain during pregnancy is common. Back pain may be caused by several factors that are related to changes during your pregnancy. Follow these instructions at home: Managing pain, stiffness, and swelling      If directed, for sudden (acute) back pain, put ice on the painful area. ? Put ice in a plastic bag. ? Place a towel between your skin and the bag. ?  Leave the ice on for 20 minutes, 2-3 times per day.  If directed, apply heat to the affected area before you exercise. Use the heat source that your health care provider recommends, such as a moist heat pack or a heating pad. ? Place a towel between your skin and the heat source. ? Leave the heat on for 20-30 minutes. ? Remove the heat if your skin turns bright red. This is especially important if you are unable to feel pain, heat, or cold. You may have a greater risk of getting burned.  If directed, massage the affected area. Activity  Exercise as told by your health care provider. Gentle exercise is the best way to prevent or manage back pain.  Listen to your body when lifting. If lifting hurts, ask for help or bend your knees. This uses your leg muscles instead of your back muscles.  Squat down when picking up something from the floor. Do not bend over.  Only use bed rest for short periods as told by your health care provider. Bed rest should only be used for the most severe episodes of back pain. Standing, sitting, and lying down  Do not stand in one place for long periods of time.  Use good posture when sitting. Make sure your head rests over your shoulders and is not hanging forward. Use a pillow on your lower back if necessary.  Try sleeping on your side, preferably the left side, with a pregnancy support pillow or 1-2 regular pillows between your legs. ? If you have back pain after a night's rest, your bed may be too soft. ? A firm mattress may provide more support for your back during pregnancy. General instructions  Do not wear high heels.  Eat a healthy diet. Try to gain weight within your health care provider's recommendations.  Use a maternity girdle, elastic sling, or back brace as told by your health care provider.  Take over-the-counter and prescription medicines only as told by your health care provider.  Work with a physical therapist or massage therapist to find  ways to manage back pain. Acupuncture or massage therapy may be helpful.  Keep all follow-up visits as told by your health care provider. This is important. Contact a health care provider if:  Your back pain interferes with your daily activities.  You have increasing pain in other parts of your body. Get help right away if:  You develop numbness, tingling, weakness, or problems with the use of your arms or legs.  You develop severe back pain that is not controlled with medicine.  You have a change in bowel or bladder control.  You develop shortness of breath, dizziness, or you faint.  You develop nausea, vomiting, or sweating.  You have back pain that is a rhythmic, cramping pain similar to labor pains. Labor pain is usually 1-2 minutes apart, lasts for about 1 minute, and involves a bearing down feeling  or pressure in your pelvis.  You have back pain and your water breaks or you have vaginal bleeding.  You have back pain or numbness that travels down your leg.  Your back pain developed after you fell.  You develop pain on one side of your back.  You see blood in your urine.  You develop skin blisters in the area of your back pain. Summary  Back pain may be caused by several factors that are related to changes during your pregnancy.  Follow instructions as told by your health care provider for managing pain, stiffness, and swelling.  Exercise as told by your health care provider. Gentle exercise is the best way to prevent or manage back pain.  Take over-the-counter and prescription medicines only as told by your health care provider.  Keep all follow-up visits as told by your health care provider. This is important. This information is not intended to replace advice given to you by your health care provider. Make sure you discuss any questions you have with your health care provider. Document Released: 07/19/2005 Document Revised: 09/26/2017 Document Reviewed: 09/26/2017  Elsevier Interactive Patient Education  2019 [employer].  Labor Induction  Labor induction is when steps are taken to cause a pregnant woman to begin the labor process. Most women go into labor on their own between 37 weeks and 42 weeks of pregnancy. When this does not happen or when there is a medical need for labor to begin, steps may be taken to induce labor. Labor induction causes a pregnant woman's uterus to contract. It also causes the cervix to soften (ripen), open (dilate), and thin out (efface). Usually, labor is not induced before 39 weeks of pregnancy unless there is a medical reason to do so. Your health care provider will determine if labor induction is needed. Before inducing labor, your health care provider will consider a number of factors, including:  Your medical condition and your baby's.  How many weeks along you are in your pregnancy.  How mature your baby's lungs are.  The condition of your cervix.  The position of your baby.  The size of your birth canal. What are some reasons for labor induction? Labor may be induced if:  Your health or your baby's health is at risk.  Your pregnancy is overdue by 1 week or more.  Your water breaks but labor does not start on its own.  There is a low amount of amniotic fluid around your baby. You may also choose (elect) to have labor induced at a certain time. Generally, elective labor induction is done no earlier than 39 weeks of pregnancy. What methods are used for labor induction? Methods used for labor induction include:  Prostaglandin medicine. This medicine starts contractions and causes the cervix to dilate and ripen. It can be taken by mouth (orally) or by being inserted into the vagina (suppository).  Inserting a small, thin tube (catheter) with a balloon into the vagina and then expanding the balloon with water to dilate the cervix.  Stripping the membranes. In this method, your health care provider gently  separates amniotic sac tissue from the cervix. This causes the cervix to stretch, which in turn causes the release of a hormone called progesterone. The hormone causes the uterus to contract. This procedure is often done during an office visit, after which you will be sent home to wait for contractions to begin.  Breaking the water. In this method, your health care provider uses a small instrument to  make a small hole in the amniotic sac. This eventually causes the amniotic sac to break. Contractions should begin after a few hours.  Medicine to trigger or strengthen contractions. This medicine is given through an IV that is inserted into a vein in your arm. Except for membrane stripping, which can be done in a clinic, labor induction is done in the hospital so that you and your baby can be carefully monitored. How long does it take for labor to be induced? The length of time it takes to induce labor depends on how ready your body is for labor. Some inductions can take up to 2-3 days, while others may take less than a day. Induction may take longer if:  You are induced early in your pregnancy.  It is your first pregnancy.  Your cervix is not ready. What are some risks associated with labor induction? Some risks associated with labor induction include:  Changes in fetal heart rate, such as being too high, too low, or irregular (erratic).  Failed induction.  Infection in the mother or the baby.  Increased risk of having a cesarean delivery.  Fetal death.  Breaking off (abruption) of the placenta from the uterus (rare).  Rupture of the uterus (very rare). When induction is needed for medical reasons, the benefits of induction generally outweigh the risks. What are some reasons for not inducing labor? Labor induction should not be done if:  Your baby does not tolerate contractions.  You have had previous surgeries on your uterus, such as a myomectomy, removal of fibroids, or a vertical  scar from a previous cesarean delivery.  Your placenta lies very low in your uterus and blocks the opening of the cervix (placenta previa).  Your baby is not in a head-down position.  The umbilical cord drops down into the birth canal in front of the baby.  There are unusual circumstances, such as the baby being very early (premature).  You have had more than 2 previous cesarean deliveries. Summary  Labor induction is when steps are taken to cause a pregnant woman to begin the labor process.  Labor induction causes a pregnant woman's uterus to contract. It also causes the cervix to ripen, dilate, and efface.  Labor is not induced before 39 weeks of pregnancy unless there is a medical reason to do so.  When induction is needed for medical reasons, the benefits of induction generally outweigh the risks. This information is not intended to replace advice given to you by your health care provider. Make sure you discuss any questions you have with your health care provider. Document Released: 08/30/2006 Document Revised: 05/24/2016 Document Reviewed: 05/24/2016 Elsevier Interactive Patient Education  2019 [employer] Inc[employer]  Fetal Movement Counts Patient Name: ________________________________________________ Patient Due Date: ____________________ What is a fetal movement count?  A fetal movement count is the number of times that you feel your baby move during a certain amount of time. This may also be called a fetal kick count. A fetal movement count is recommended for every pregnant woman. You may be asked to start counting fetal movements as early as week 28 of your pregnancy. Pay attention to when your baby is most active. You may notice your baby's sleep and wake cycles. You may also notice things that make your baby move more. You should do a fetal movement count:  When your baby is normally most active.  At the same time each day. A good time to count movements is while you are  resting,  after having something to eat and drink. How do I count fetal movements? 1. Find a quiet, comfortable area. Sit, or lie down on your side. 2. Write down the date, the start time and stop time, and the number of movements that you felt between those two times. Take this information with you to your health care visits. 3. For 2 hours, count kicks, flutters, swishes, rolls, and jabs. You should feel at least 10 movements during 2 hours. 4. You may stop counting after you have felt 10 movements. 5. If you do not feel 10 movements in 2 hours, have something to eat and drink. Then, keep resting and counting for 1 hour. If you feel at least 4 movements during that hour, you may stop counting. Contact a health care provider if:  You feel fewer than 4 movements in 2 hours.  Your baby is not moving like he or she usually does. Date: ____________ Start time: ____________ Stop time: ____________ Movements: ____________ Date: ____________ Start time: ____________ Stop time: ____________ Movements: ____________ Date: ____________ Start time: ____________ Stop time: ____________ Movements: ____________ Date: ____________ Start time: ____________ Stop time: ____________ Movements: ____________ Date: ____________ Start time: ____________ Stop time: ____________ Movements: ____________ Date: ____________ Start time: ____________ Stop time: ____________ Movements: ____________ Date: ____________ Start time: ____________ Stop time: ____________ Movements: ____________ Date: ____________ Start time: ____________ Stop time: ____________ Movements: ____________ Date: ____________ Start time: ____________ Stop time: ____________ Movements: ____________ This information is not intended to replace advice given to you by your health care provider. Make sure you discuss any questions you have with your health care provider. Document Released: 05/10/2006 Document Revised: 12/08/2015 Document Reviewed: 05/20/2015  Elsevier Interactive Patient Education  2019 [employer].

## 2018-09-19 NOTE — Progress Notes (Signed)
ROB-Patient c/o mid to lower left side back pain and discomfort, pain is more intense when she needs to urinate.

## 2018-09-20 ENCOUNTER — Telehealth: Payer: Self-pay

## 2018-09-20 NOTE — Telephone Encounter (Signed)
Coronavirus (COVID-19) Are you at risk?  Are you at risk for the Coronavirus (COVID-19)?  To be considered HIGH RISK for Coronavirus (COVID-19), you have to meet the following criteria:  . Traveled to [place], [place], [place], [place] or [place]; or in the [place] to [place], [place], [place], or [place]; and have fever, cough, and shortness of breath within the last 2 weeks of travel OR . Been in close contact with a person diagnosed with COVID-19 within the last 2 weeks and have fever, cough, and shortness of breath . IF YOU DO NOT MEET THESE CRITERIA, YOU ARE CONSIDERED LOW RISK FOR COVID-19.  What to do if you are HIGH RISK for COVID-19?  . If you are having a medical emergency, call 911. . Seek medical care right away. Before you go to a doctor's office, urgent care or emergency department, call ahead and tell them about your recent travel, contact with someone diagnosed with COVID-19, and your symptoms. You should receive instructions from your physician's office regarding next steps of care.  . When you arrive at healthcare provider, tell the healthcare staff immediately you have returned from visiting [place], [place], [place], [place] or [place]; or traveled in the [place] to [place], [place], [place], or [place]; in the last two weeks or you have been in close contact with a person diagnosed with COVID-19 in the last 2 weeks.   . Tell the health care staff about your symptoms: fever, cough and shortness of breath. . After you have been seen by a medical provider, you will be either: o Tested for (COVID-19) and discharged home on quarantine except to seek medical care if symptoms worsen, and asked to  - Stay home and avoid contact with others until you get your results (4-5 days)  - Avoid travel on public transportation if possible (such as bus, train, or airplane) or o Sent to the Emergency Department by EMS for evaluation, COVID-19 testing, and possible  admission depending on your condition and test results.  What to do if you are LOW RISK for COVID-19?  Reduce your risk of any infection by using the same precautions used for avoiding the common cold or flu:  . Wash your hands often with soap and warm water for at least 20 seconds.  If soap and water are not readily available, use an alcohol-based hand sanitizer with at least 60% alcohol.  . If coughing or sneezing, cover your mouth and nose by coughing or sneezing into the elbow areas of your shirt or coat, into a tissue or into your sleeve (not your hands). . Avoid shaking hands with others and consider head nods or verbal greetings only. . Avoid touching your eyes, nose, or mouth with unwashed hands.  . Avoid close contact with people who are Otoniel Myhand. . Avoid places or events with large numbers of people in one location, like concerts or sporting events. . Carefully consider travel plans you have or are making. . If you are planning any travel outside or inside the [place], visit the CDC's Travelers' Health webpage for the latest health notices. . If you have some symptoms but not all symptoms, continue to monitor at home and seek medical attention if your symptoms worsen. . If you are having a medical emergency, call 911.  09/20/18 SCREENING NEG SLS ADDITIONAL HEALTHCARE OPTIONS FOR PATIENTS  [facility] / e-Visit: [email or web address]         [facility]: [phone]    [facility]: [phone]                   [facility]: [phone]  

## 2018-09-23 ENCOUNTER — Other Ambulatory Visit: Payer: Commercial Managed Care - PPO

## 2018-09-23 ENCOUNTER — Other Ambulatory Visit: Payer: Self-pay

## 2018-09-23 ENCOUNTER — Ambulatory Visit (INDEPENDENT_AMBULATORY_CARE_PROVIDER_SITE_OTHER): Payer: Commercial Managed Care - PPO | Admitting: Certified Nurse Midwife

## 2018-09-23 ENCOUNTER — Ambulatory Visit
Admission: RE | Admit: 2018-09-23 | Discharge: 2018-09-23 | Disposition: A | Discharge status: Home / Self Care | Payer: Commercial Managed Care - PPO | Source: Ambulatory Visit | Attending: Obstetrics and Gynecology | Admitting: Obstetrics and Gynecology

## 2018-09-23 VITALS — BP 130/80 | HR 81 | Wt 206.0 lb

## 2018-09-23 DIAGNOSIS — Z3A41 41 weeks gestation of pregnancy: Secondary | ICD-10-CM

## 2018-09-23 DIAGNOSIS — O480 Post-term pregnancy: Secondary | ICD-10-CM

## 2018-09-23 DIAGNOSIS — Z3403 Encounter for supervision of normal first pregnancy, third trimester: Secondary | ICD-10-CM

## 2018-09-23 LAB — POCT URINALYSIS DIPSTICK OB
Bilirubin, UA: NEGATIVE
Glucose, UA: NEGATIVE
Ketones, UA: NEGATIVE
Leukocytes, UA: NEGATIVE
Nitrite, UA: NEGATIVE
Spec Grav, UA: 1.02 (ref 1.010–1.025)
Urobilinogen, UA: 0.2 E.U./dL
pH, UA: 6 (ref 5.0–8.0)

## 2018-09-23 NOTE — Patient Instructions (Signed)

## 2018-09-23 NOTE — Progress Notes (Signed)
ROB and NST-Patient c/o right sided HA on and off since Saturday, took Tylenol with no relief.  Patient also c/o mucous vaginal discharge x3 days, noticed some pink in it. Negative clonus or visual changes. Reviewed red flag symptoms and when to call. Induction scheduled for Wednesday at 0500. RTC x 7 weeks for PPV or sooner if needed.   NONSTRESS TEST INTERPRETATION  INDICATIONS: Postdates FHR baseline: 135 bpm RESULTS:Reactive COMMENTS: Irregular contractions   PLAN: 1. Continue fetal kick counts twice a day. 2. Reviewed red flag symptoms and when to call.  3. Report to birthing suites for IOL as indicated or sooner if needed.    Gunnar Bulla, CNM [facility], [facility] 09/23/18 1:56 PM

## 2018-09-24 ENCOUNTER — Inpatient Hospital Stay
Admission: EM | Admit: 2018-09-24 | Discharge: 2018-09-27 | DRG: 806 | Disposition: A | Discharge status: Home / Self Care | Payer: Commercial Managed Care - PPO | Attending: Certified Nurse Midwife | Admitting: Certified Nurse Midwife

## 2018-09-24 ENCOUNTER — Other Ambulatory Visit: Payer: Self-pay

## 2018-09-24 DIAGNOSIS — Z1159 Encounter for screening for other viral diseases: Secondary | ICD-10-CM

## 2018-09-24 DIAGNOSIS — F419 Anxiety disorder, unspecified: Secondary | ICD-10-CM | POA: Diagnosis present

## 2018-09-24 DIAGNOSIS — K219 Gastro-esophageal reflux disease without esophagitis: Secondary | ICD-10-CM | POA: Diagnosis present

## 2018-09-24 DIAGNOSIS — O99824 Streptococcus B carrier state complicating childbirth: Secondary | ICD-10-CM | POA: Diagnosis present

## 2018-09-24 DIAGNOSIS — Z3A41 41 weeks gestation of pregnancy: Secondary | ICD-10-CM | POA: Diagnosis not present

## 2018-09-24 DIAGNOSIS — O9081 Anemia of the puerperium: Secondary | ICD-10-CM | POA: Diagnosis not present

## 2018-09-24 DIAGNOSIS — O480 Post-term pregnancy: Secondary | ICD-10-CM | POA: Diagnosis present

## 2018-09-24 DIAGNOSIS — O99344 Other mental disorders complicating childbirth: Secondary | ICD-10-CM | POA: Diagnosis present

## 2018-09-24 DIAGNOSIS — O9962 Diseases of the digestive system complicating childbirth: Secondary | ICD-10-CM | POA: Diagnosis present

## 2018-09-24 DIAGNOSIS — B951 Streptococcus, group B, as the cause of diseases classified elsewhere: Secondary | ICD-10-CM

## 2018-09-24 DIAGNOSIS — Z3490 Encounter for supervision of normal pregnancy, unspecified, unspecified trimester: Secondary | ICD-10-CM | POA: Diagnosis present

## 2018-09-24 HISTORY — DX: Other specified health status: Z78.9

## 2018-09-24 LAB — NOVEL CORONAVIRUS, NAA (HOSP ORDER, SEND-OUT TO REF LAB; TAT 18-24 HRS): SARS-CoV-2, NAA: NOT DETECTED

## 2018-09-24 MED ORDER — SODIUM CHLORIDE 0.9% FLUSH: 3.0000 mL | Freq: Two times a day (BID) | INTRAVENOUS | Status: DC

## 2018-09-24 MED ORDER — SODIUM CHLORIDE 0.9 % IV SOLN: 250.0000 mL | INTRAVENOUS | Status: DC | PRN

## 2018-09-24 MED ORDER — FENTANYL CITRATE (PF) 100 MCG/2ML IJ SOLN: 50.0000 ug | INTRAMUSCULAR | Status: DC | PRN

## 2018-09-24 MED ORDER — LACTATED RINGERS IV SOLN
INTRAVENOUS | Status: DC
  Administered 2018-09-25 (×2): via INTRAVENOUS

## 2018-09-24 MED ORDER — LIDOCAINE HCL (PF) 1 % IJ SOLN: 30.0000 mL | INTRAMUSCULAR | Status: DC | PRN

## 2018-09-24 MED ORDER — OXYTOCIN BOLUS FROM INFUSION
500.0000 mL | Freq: Once | INTRAVENOUS | Status: AC
  Administered 2018-09-25: 17:00:00 500 mL via INTRAVENOUS

## 2018-09-24 MED ORDER — OXYTOCIN 40 UNITS IN NORMAL SALINE INFUSION - SIMPLE MED
2.5000 [IU]/h | INTRAVENOUS | Status: DC
  Filled 2018-09-24: qty 1000

## 2018-09-24 MED ORDER — SOD CITRATE-CITRIC ACID 500-334 MG/5ML PO SOLN: 30.0000 mL | ORAL | Status: DC | PRN

## 2018-09-24 MED ORDER — ACETAMINOPHEN 325 MG PO TABS
650.0000 mg | ORAL_TABLET | ORAL | Status: DC | PRN
  Administered 2018-09-25: 02:00:00 650 mg via ORAL
  Filled 2018-09-24: qty 2

## 2018-09-24 MED ORDER — SODIUM CHLORIDE 0.9 % IV SOLN
5.0000 10*6.[IU] | Freq: Once | INTRAVENOUS | Status: AC
  Administered 2018-09-25: 5 10*6.[IU] via INTRAVENOUS
  Filled 2018-09-24: qty 5

## 2018-09-24 MED ORDER — ONDANSETRON HCL 4 MG/2ML IJ SOLN
4.0000 mg | Freq: Four times a day (QID) | INTRAMUSCULAR | Status: DC | PRN
  Administered 2018-09-25: 4 mg via INTRAVENOUS
  Filled 2018-09-24: qty 2

## 2018-09-24 MED ORDER — HYDROXYZINE HCL 50 MG PO TABS
50.0000 mg | ORAL_TABLET | Freq: Four times a day (QID) | ORAL | Status: DC | PRN
  Filled 2018-09-24: qty 1

## 2018-09-24 MED ORDER — SODIUM CHLORIDE 0.9% FLUSH: 3.0000 mL | INTRAVENOUS | Status: DC | PRN

## 2018-09-24 MED ORDER — LACTATED RINGERS IV SOLN: 500.0000 mL | INTRAVENOUS | Status: DC | PRN

## 2018-09-24 MED ORDER — PENICILLIN G 3 MILLION UNITS IVPB - SIMPLE MED
3.0000 10*6.[IU] | INTRAVENOUS | Status: DC
  Administered 2018-09-25 (×3): 3 10*6.[IU] via INTRAVENOUS
  Filled 2018-09-24 (×2): qty 100
  Filled 2018-09-24: qty 3
  Filled 2018-09-24: qty 100
  Filled 2018-09-24: qty 3
  Filled 2018-09-24 (×5): qty 100

## 2018-09-24 NOTE — H&P (Signed)
Obstetric History and Physical  Regina Gray is a 28 y.o. G2P0010 with IUP at [redacted]w[redacted]d presenting with irregular contractions since 5pm today. Patient states she has been having  irregular, every 3-6 minutes contractions, spotting vaginal bleeding, intact membranes, with active fetal movement.    Prenatal Course Source of Care: [facility]  Pregnancy complications or risks:anemia, anxiety/depression  Prenatal labs and studies: ABO, Rh: A/Positive/-- (10/25 1056) Antibody: Negative (10/25 1056) Rubella: 1.46 (10/25 1056) RPR: Non Reactive (03/04 0914)  HBsAg: Negative (10/25 1056)  HIV: Non Reactive (10/25 1056)  MNO:TRRNHAFB (04/30 1513) 1 hr Glucola  normal Genetic screening normal Anatomy US normal  Past Medical History:  Diagnosis Date  . Medical history non-contributory     Past Surgical History:  Procedure Laterality Date  . ANTERIOR CRUCIATE LIGAMENT REPAIR    . APPENDECTOMY    . WISDOM TOOTH EXTRACTION      OB History  Gravida Para Term Preterm AB Living  2       1    SAB TAB Ectopic Multiple Live Births  1            # Outcome Date GA Lbr Len/2nd Weight Sex Delivery Anes PTL Lv  2 Current           1 SAB 2014            Social History   Socioeconomic History  . Marital status: Married    Spouse name: Not on file  . Number of children: Not on file  . Years of education: Not on file  . Highest education level: Not on file  Occupational History  . Not on file  Social Needs  . Financial resource strain: Not on file  . Food insecurity:    Worry: Not on file    Inability: Not on file  . Transportation needs:    Medical: Not on file    Non-medical: Not on file  Tobacco Use  . Smoking status: Never Smoker  . Smokeless tobacco: Never Used  Substance and Sexual Activity  . Alcohol use: Not Currently    Comment: occas  . Drug use: No  . Sexual activity: Yes    Birth control/protection: I.U.D.  Lifestyle  . Physical activity:    Days per week: Not on  file    Minutes per session: Not on file  . Stress: Not on file  Relationships  . Social connections:    Talks on phone: Not on file    Gets together: Not on file    Attends religious service: Not on file    Active member of club or organization: Not on file    Attends meetings of clubs or organizations: Not on file    Relationship status: Not on file  Other Topics Concern  . Not on file  Social History Narrative  . Not on file    Family History  Problem Relation Age of Onset  . Heart attack Maternal Grandfather   . Healthy Mother   . Healthy Father   . Breast cancer Neg Hx   . Ovarian cancer Neg Hx   . Colon cancer Neg Hx   . Diabetes Neg Hx     Medications Prior to Admission  Medication Sig Dispense Refill Last Dose  . omeprazole (PRILOSEC) 20 MG capsule Take 20 mg by mouth daily.   09/24/2018 at Unknown time  . ondansetron (ZOFRAN ODT) 4 MG disintegrating tablet Take 1 tablet (4 mg total) by mouth every 8 (eight)  hours as needed for nausea or vomiting. 30 tablet 1 09/24/2018 at Unknown time  . Prenatal Vit-Fe Fumarate-FA (PRENATAL MULTIVITAMIN) TABS tablet Take 1 tablet by mouth daily at 12 noon.   09/23/2018 at Unknown time  . promethazine (PHENERGAN) 25 MG tablet Take 1 tablet (25 mg total) by mouth every 6 (six) hours as needed for nausea or vomiting. 30 tablet 2 09/23/2018 at Unknown time    Allergies  Allergen Reactions  . Codeine   . Hydromorphone     Other reaction(s): Vomiting  . Oxycodone     Other reaction(s): Vomiting  . Sulfa Antibiotics     Review of Systems: Negative except for what is mentioned in HPI.  Physical Exam: BP 137/85 (BP Location: Left Arm)   Pulse 95   Temp 98.5 F (36.9 C) (Oral)   Resp 18   Ht 5\' 9"  (1.753 m)   Wt 93.4 kg   LMP 12/09/2017 (Exact Date)   BMI 30.42 kg/m  GENERAL: Well-developed, well-nourished female in no acute distress.  LUNGS: Clear to auscultation bilaterally.  HEART: Regular rate and rhythm. ABDOMEN: Soft,  nontender, nondistended, gravid. EXTREMITIES: Nontender, no edema, 2+ distal pulses. Cervical Exam: Dilation: 3.5 Effacement (%): 90 Station: -1 Presentation: Vertex Exam by:: Galen ManilaM Andersyn Fragoso CNM FHT:  Baseline rate 150 bpm   Variability moderate  Accelerations present   Decelerations none Contractions: Every 5 mins   Pertinent Labs/Studies:   No results found for this or any previous visit (from the past 24 hour(s)).  Assessment : Regina Gray is a 28 y.o. G2P0010 at 5362w1d being admitted for labor.  Plan: Labor: Expectant management.  Induction/Augmentation as needed, per protocol FWB: Reassuring fetal heart tracing.  GBS positive Delivery plan: Hopeful for vaginal delivery  Acheron Sugg, CNM [facility], [facility]

## 2018-09-24 NOTE — OB Triage Note (Signed)
Pt is a 28 y/o G2P0 at [redacted]w[redacted]d w/ c/o ctx q19min since 4:30PM. Pt denies gushes of fluid, but states and small increase in white discharge. Pt states spotting since a cervical check yesterday, which has stopped about noon today. Pt states positive FM. Monitors applied and assessing. Initial FHT 155.

## 2018-09-25 ENCOUNTER — Inpatient Hospital Stay: Payer: Commercial Managed Care - PPO | Admitting: Anesthesiology

## 2018-09-25 ENCOUNTER — Encounter: Payer: Self-pay | Admitting: Anesthesiology

## 2018-09-25 DIAGNOSIS — Z3A41 41 weeks gestation of pregnancy: Secondary | ICD-10-CM

## 2018-09-25 DIAGNOSIS — O99824 Streptococcus B carrier state complicating childbirth: Secondary | ICD-10-CM

## 2018-09-25 DIAGNOSIS — O480 Post-term pregnancy: Principal | ICD-10-CM

## 2018-09-25 LAB — CBC
HCT: 34.6 % — ABNORMAL LOW (ref 36.0–46.0)
Hemoglobin: 11.5 g/dL — ABNORMAL LOW (ref 12.0–15.0)
MCH: 28.7 pg (ref 26.0–34.0)
MCHC: 33.2 g/dL (ref 30.0–36.0)
MCV: 86.3 fL (ref 80.0–100.0)
Platelets: 270 10*3/uL (ref 150–400)
RBC: 4.01 MIL/uL (ref 3.87–5.11)
RDW: 13.3 % (ref 11.5–15.5)
WBC: 9.9 10*3/uL (ref 4.0–10.5)
nRBC: 0 % (ref 0.0–0.2)

## 2018-09-25 LAB — TYPE AND SCREEN
ABO/RH(D): A POS
Antibody Screen: NEGATIVE

## 2018-09-25 MED ORDER — SIMETHICONE 80 MG PO CHEW: 80.0000 mg | CHEWABLE_TABLET | ORAL | Status: DC | PRN

## 2018-09-25 MED ORDER — EPHEDRINE 5 MG/ML INJ: 10.0000 mg | INTRAVENOUS | Status: DC | PRN

## 2018-09-25 MED ORDER — AMMONIA AROMATIC IN INHA
RESPIRATORY_TRACT | Status: AC
  Filled 2018-09-25: qty 10

## 2018-09-25 MED ORDER — ZOLPIDEM TARTRATE 5 MG PO TABS: 5.0000 mg | ORAL_TABLET | Freq: Every evening | ORAL | Status: DC | PRN

## 2018-09-25 MED ORDER — PRENATAL MULTIVITAMIN CH
1.0000 | ORAL_TABLET | Freq: Every day | ORAL | Status: DC
  Administered 2018-09-26 – 2018-09-27 (×2): 1 via ORAL
  Filled 2018-09-25 (×2): qty 1

## 2018-09-25 MED ORDER — LIDOCAINE HCL (PF) 1 % IJ SOLN
INTRAMUSCULAR | Status: AC
  Filled 2018-09-25: qty 30

## 2018-09-25 MED ORDER — OXYTOCIN 10 UNIT/ML IJ SOLN
INTRAMUSCULAR | Status: AC
  Filled 2018-09-25: qty 2

## 2018-09-25 MED ORDER — MISOPROSTOL 200 MCG PO TABS
ORAL_TABLET | ORAL | Status: AC
  Filled 2018-09-25: qty 4

## 2018-09-25 MED ORDER — LACTATED RINGERS IV SOLN
500.0000 mL | Freq: Once | INTRAVENOUS | Status: AC
  Administered 2018-09-25: 500 mL via INTRAVENOUS

## 2018-09-25 MED ORDER — BENZOCAINE-MENTHOL 20-0.5 % EX AERO
INHALATION_SPRAY | CUTANEOUS | Status: AC
  Administered 2018-09-25: 1 via TOPICAL
  Filled 2018-09-25: qty 56

## 2018-09-25 MED ORDER — DIBUCAINE (PERIANAL) 1 % EX OINT: 1.0000 "application " | TOPICAL_OINTMENT | CUTANEOUS | Status: DC | PRN

## 2018-09-25 MED ORDER — LIDOCAINE-EPINEPHRINE (PF) 1.5 %-1:200000 IJ SOLN
INTRAMUSCULAR | Status: DC | PRN
  Administered 2018-09-25: 3 mL via EPIDURAL

## 2018-09-25 MED ORDER — PHENYLEPHRINE 40 MCG/ML (10ML) SYRINGE FOR IV PUSH (FOR BLOOD PRESSURE SUPPORT): 80.0000 ug | PREFILLED_SYRINGE | INTRAVENOUS | Status: DC | PRN

## 2018-09-25 MED ORDER — WITCH HAZEL-GLYCERIN EX PADS
MEDICATED_PAD | CUTANEOUS | Status: AC
  Administered 2018-09-25: 1 via TOPICAL
  Filled 2018-09-25: qty 100

## 2018-09-25 MED ORDER — ONDANSETRON HCL 4 MG/2ML IJ SOLN: 4.0000 mg | INTRAMUSCULAR | Status: DC | PRN

## 2018-09-25 MED ORDER — DIPHENHYDRAMINE HCL 25 MG PO CAPS: 25.0000 mg | ORAL_CAPSULE | Freq: Four times a day (QID) | ORAL | Status: DC | PRN

## 2018-09-25 MED ORDER — ACETAMINOPHEN 325 MG PO TABS
650.0000 mg | ORAL_TABLET | ORAL | Status: DC | PRN
  Administered 2018-09-27: 08:00:00 650 mg via ORAL
  Filled 2018-09-25: qty 2

## 2018-09-25 MED ORDER — LIDOCAINE HCL (PF) 1 % IJ SOLN
INTRAMUSCULAR | Status: DC | PRN
  Administered 2018-09-25 (×2): 1 mL via INTRADERMAL

## 2018-09-25 MED ORDER — FENTANYL 2.5 MCG/ML W/ROPIVACAINE 0.15% IN NS 100 ML EPIDURAL (ARMC)
12.0000 mL/h | EPIDURAL | Status: DC
  Administered 2018-09-25 (×2): 12 mL/h via EPIDURAL
  Filled 2018-09-25: qty 100

## 2018-09-25 MED ORDER — DIPHENHYDRAMINE HCL 50 MG/ML IJ SOLN: 12.5000 mg | INTRAMUSCULAR | Status: DC | PRN

## 2018-09-25 MED ORDER — SODIUM CHLORIDE 0.9 % IV SOLN
INTRAVENOUS | Status: DC | PRN
  Administered 2018-09-25 (×2): 5 mL via EPIDURAL

## 2018-09-25 MED ORDER — OXYTOCIN 40 UNITS IN NORMAL SALINE INFUSION - SIMPLE MED
1.0000 m[IU]/min | INTRAVENOUS | Status: DC
  Administered 2018-09-25: 08:00:00 2 m[IU]/min via INTRAVENOUS

## 2018-09-25 MED ORDER — FENTANYL 2.5 MCG/ML W/ROPIVACAINE 0.15% IN NS 100 ML EPIDURAL (ARMC)
EPIDURAL | Status: AC
  Filled 2018-09-25: qty 100

## 2018-09-25 MED ORDER — WITCH HAZEL-GLYCERIN EX PADS
1.0000 "application " | MEDICATED_PAD | CUTANEOUS | Status: DC | PRN
  Administered 2018-09-25 – 2018-09-27 (×2): 1 via TOPICAL
  Filled 2018-09-25: qty 100

## 2018-09-25 MED ORDER — PANTOPRAZOLE SODIUM 40 MG PO TBEC
40.0000 mg | DELAYED_RELEASE_TABLET | Freq: Every day | ORAL | Status: DC
  Administered 2018-09-25 – 2018-09-27 (×3): 40 mg via ORAL
  Filled 2018-09-25 (×3): qty 1

## 2018-09-25 MED ORDER — ONDANSETRON HCL 4 MG PO TABS
4.0000 mg | ORAL_TABLET | ORAL | Status: DC | PRN
  Administered 2018-09-25: 21:00:00 4 mg via ORAL
  Filled 2018-09-25: qty 1

## 2018-09-25 MED ORDER — IBUPROFEN 600 MG PO TABS
600.0000 mg | ORAL_TABLET | Freq: Four times a day (QID) | ORAL | Status: DC
  Administered 2018-09-25 – 2018-09-27 (×7): 600 mg via ORAL
  Filled 2018-09-25 (×7): qty 1

## 2018-09-25 MED ORDER — BENZOCAINE-MENTHOL 20-0.5 % EX AERO
1.0000 "application " | INHALATION_SPRAY | CUTANEOUS | Status: DC | PRN
  Administered 2018-09-25 – 2018-09-27 (×2): 1 via TOPICAL
  Filled 2018-09-25: qty 56

## 2018-09-25 MED ORDER — METHYLERGONOVINE MALEATE 0.2 MG PO TABS
0.2000 mg | ORAL_TABLET | ORAL | Status: DC | PRN
  Filled 2018-09-25: qty 1

## 2018-09-25 MED ORDER — TERBUTALINE SULFATE 1 MG/ML IJ SOLN: 0.2500 mg | Freq: Once | INTRAMUSCULAR | Status: DC | PRN

## 2018-09-25 MED ORDER — METHYLERGONOVINE MALEATE 0.2 MG/ML IJ SOLN: 0.2000 mg | INTRAMUSCULAR | Status: DC | PRN

## 2018-09-25 MED ORDER — OXYTOCIN 10 UNIT/ML IJ SOLN
INTRAMUSCULAR | Status: AC
  Administered 2018-09-25: 18:00:00 10 [IU]
  Filled 2018-09-25: qty 2

## 2018-09-25 MED ORDER — CALCIUM CARBONATE ANTACID 500 MG PO CHEW
200.0000 mg | CHEWABLE_TABLET | Freq: Two times a day (BID) | ORAL | Status: DC
  Administered 2018-09-25: 04:00:00 200 mg via ORAL
  Filled 2018-09-25 (×2): qty 1

## 2018-09-25 MED ORDER — MISOPROSTOL 50MCG HALF TABLET: 50.0000 ug | ORAL_TABLET | ORAL | Status: DC

## 2018-09-25 MED ORDER — SENNOSIDES-DOCUSATE SODIUM 8.6-50 MG PO TABS
2.0000 | ORAL_TABLET | ORAL | Status: DC
  Administered 2018-09-26 – 2018-09-27 (×2): 2 via ORAL
  Filled 2018-09-25 (×2): qty 2

## 2018-09-25 MED ORDER — COCONUT OIL OIL
1.0000 "application " | TOPICAL_OIL | Status: DC | PRN
  Filled 2018-09-25: qty 120

## 2018-09-25 MED ORDER — CALCIUM CARBONATE ANTACID 500 MG PO CHEW
CHEWABLE_TABLET | ORAL | Status: AC
  Administered 2018-09-25: 04:00:00 200 mg via ORAL
  Filled 2018-09-25: qty 1

## 2018-09-25 NOTE — Progress Notes (Signed)
Patient ID: Brave Muni, female   DOB: 03-20-1991, 28 y.o.   MRN: [id number]  Regina Gray is a 28 y.o. G2P0010 at [redacted]w[redacted]d by ultrasound admitted for early latent labor  Subjective:  Patient resting quietly in bed, reports relief of contraction pain since epidural placement.   Denies difficulty breathing or respiratory distress, chest pain, abdominal pain, dysuria, and leg pain.   FOB at bedside for continuous labor support.   Objective:  Temp:  [98.1 F (36.7 C)-98.6 F (37 C)] 98.1 F (36.7 C) (06/03 1105) Pulse Rate:  [64-95] 75 (06/03 1030) Resp:  [18] 18 (06/03 0325) BP: (124-145)/(68-89) 133/68 (06/03 1030) SpO2:  [95 %-96 %] 95 % (06/03 1105) Weight:  [93.4 kg] 93.4 kg (06/02 2254)  Fetal Wellbeing:  Category I  UC:   regular, every one (1) to three (3) minutes; soft resting tone; pitocin infusing at 12 mu/min  SVE:   Dilation: 6 Effacement (%): 100 Station: -1, 0 Exam by:: Jaelene Garciagarcia CNM  Labs: Lab Results  Component Value Date   WBC 9.9 09/24/2018   HGB 11.5 (L) 09/24/2018   HCT 34.6 (L) 09/24/2018   MCV 86.3 09/24/2018   PLT 270 09/24/2018    Assessment:  28 year old G1P0 at [redacted]w[redacted]d admitted for labor, Rh positive, GBS positive, pitocin augmentation, history of anemia, history of anxiety and depression  FHR Category I  Plan:  Encouraged rest, position change, and use of peanut ball.   Continue orders as written. Reassess as needed.    Gunnar Bulla, CNM [facility], [facility] 09/25/2018, 11:13 AM

## 2018-09-25 NOTE — Anesthesia Procedure Notes (Signed)
Epidural Patient location during procedure: OB Start time: 09/25/2018 9:58 AM End time: 09/25/2018 10:05 AM  Staffing Anesthesiologist: Ciearra Rufo, Cleda Mccreedy, MD Performed: anesthesiologist   Preanesthetic Checklist Completed: patient identified, site marked, surgical consent, pre-op evaluation, timeout performed, IV checked, risks and benefits discussed and monitors and equipment checked  Epidural Patient position: sitting Prep: ChloraPrep Patient monitoring: heart rate, continuous pulse ox and blood pressure Approach: midline Location: L3-L4 Injection technique: LOR saline  Needle:  Needle type: Tuohy  Needle gauge: 17 G Needle length: 9 cm and 9 Needle insertion depth: 5 cm Catheter type: closed end flexible Catheter size: 19 Gauge Catheter at skin depth: 10 cm Test dose: negative and 1.5% lidocaine with Epi 1:200 K  Assessment Sensory level: T10 Events: blood not aspirated, injection not painful, no injection resistance, negative IV test and no paresthesia  Additional Notes 2 attempts Pt. Evaluated and documentation done after procedure finished. Patient identified. Risks/Benefits/Options discussed with patient including but not limited to bleeding, infection, nerve damage, paralysis, failed block, incomplete pain control, headache, blood pressure changes, nausea, vomiting, reactions to medication both or allergic, itching and postpartum back pain. Confirmed with bedside nurse the patient's most recent platelet count. Confirmed with patient that they are not currently taking any anticoagulation, have any bleeding history or any family history of bleeding disorders. Patient expressed understanding and wished to proceed. All questions were answered. Sterile technique was used throughout the entire procedure. Please see nursing notes for vital signs. Test dose was given through epidural catheter and negative prior to continuing to dose epidural or start infusion. Warning signs of  high block given to the patient including shortness of breath, tingling/numbness in hands, complete motor block, or any concerning symptoms with instructions to call for help. Patient was given instructions on fall risk and not to get out of bed. All questions and concerns addressed with instructions to call with any issues or inadequate analgesia.   Patient tolerated the insertion well without immediate complications.Reason for block:procedure for pain

## 2018-09-25 NOTE — Progress Notes (Signed)
Patient ID: Regina Gray, female   DOB: 09/21/1990, 28 y.o.   MRN: [id number]  Regina Gray is a 28 y.o. G2P0010 at [redacted]w[redacted]d by ultrasound admitted for latent labor  Subjective:  Patient resting in bed on left side, reports decreased frequency and intensity of contractions.   Denies difficulty breathing or respiratory distress, chest pain, vaginal pain, dysuria, and leg pain.   Spouse at bedside for continuous labor support.   Objective:  Temp:  [98.5 F (36.9 C)-98.6 F (37 C)] 98.6 F (37 C) (06/03 0325) Pulse Rate:  [69-95] 84 (06/03 0350) Resp:  [18] 18 (06/03 0325) BP: (132-145)/(76-88) 132/88 (06/03 0350) Weight:  [93.4 kg] 93.4 kg (06/02 2254)  Fetal Wellbeing:  Category I  UC:   irregular, every seven (7) minutes; soft resting tone  SVE:   Dilation: 4.5 Effacement (%): 90 Station: -1, 0 Exam by:: Willodean Rosenthal CNM  Labs: Lab Results  Component Value Date   WBC 9.9 09/24/2018   HGB 11.5 (L) 09/24/2018   HCT 34.6 (L) 09/24/2018   MCV 86.3 09/24/2018   PLT 270 09/24/2018    Assessment:  28 year old G1P0 at [redacted]w[redacted]d admitted for labor, Rh positive, GBS positive, history of anemia, history of anxiety and depression  FHR Category I  Plan:  Induction/Augmentation per protocol, see orders.   Encouraged position change and use of peanut ball.   Continue orders as written. Reassess as needed.    Gunnar Bulla, CNM [facility], [facility] 09/25/2018, 7:26 AM

## 2018-09-25 NOTE — Progress Notes (Signed)
Patient ID: Karyss Kassa, female   DOB: [birth year], 28 y.o.   MRN: [id number]  Zyiona Pancake is a 28 y.o. G2P0010 at [redacted]w[redacted]d by ultrasound admitted for latent labor  Subjective:  Patient resting in bed on left side, breathing through contractions. Denies difficulty breathing or respiratory distress, chest pain, vaginal bleeding, dysuria, and leg pain.   Spouse at bedside for continuous labor support.   Objective:  Temp:  [98.5 F (36.9 C)-98.6 F (37 C)] 98.6 F (37 C) (06/03 0325) Pulse Rate:  [69-95] 69 (06/03 0325) Resp:  [18] 18 (06/03 0325) BP: (137-145)/(76-85) 145/76 (06/03 0325) Weight:  [93.4 kg] 93.4 kg (06/02 2254)  Fetal Wellbeing:  Category I  UC:   irregular, every two (2) to five (5) minutes; soft resting tone  SVE:   Dilation: 4.5 Effacement (%): 90 Station: -1, 0 Exam by:: Willodean Rosenthal CNM  Labs: Lab Results  Component Value Date   WBC 9.9 09/24/2018   HGB 11.5 (L) 09/24/2018   HCT 34.6 (L) 09/24/2018   MCV 86.3 09/24/2018   PLT 270 09/24/2018    Assessment:  28 year old G1P0 at [redacted]w[redacted]d admitted for labor, Rh positive, GBS positive, history of anemia, history of anxiety and depression  FHR Category I  Plan:  Encouraged position change and use of peanut ball.   Continue orders as written. Reassess as needed.    Gunnar Bulla, CNM [facility], [facility] 09/25/2018, 3:39 AM

## 2018-09-25 NOTE — Anesthesia Preprocedure Evaluation (Signed)
Anesthesia Evaluation  Patient identified by MRN, date of birth, ID band Patient awake    Reviewed: Allergy & Precautions, H&P , NPO status , Patient's Chart, lab work & pertinent test results  History of Anesthesia Complications Negative for: history of anesthetic complications  Airway Mallampati: III  TM Distance: >3 FB Neck ROM: full    Dental  (+) Chipped   Pulmonary neg pulmonary ROS,           Cardiovascular Exercise Tolerance: Good (-) hypertensionnegative cardio ROS       Neuro/Psych    GI/Hepatic GERD  Medicated and Controlled,  Endo/Other    Renal/GU   negative genitourinary   Musculoskeletal   Abdominal   Peds  Hematology negative hematology ROS (+)   Anesthesia Other Findings Past Medical History: No date: Medical history non-contributory  Past Surgical History: No date: ANTERIOR CRUCIATE LIGAMENT REPAIR No date: APPENDECTOMY No date: WISDOM TOOTH EXTRACTION  BMI    Body Mass Index:  30.42 kg/m      Reproductive/Obstetrics (+) Pregnancy                             Anesthesia Physical Anesthesia Plan  ASA: II  Anesthesia Plan: Epidural   Post-op Pain Management:    Induction:   PONV Risk Score and Plan:   Airway Management Planned:   Additional Equipment:   Intra-op Plan:   Post-operative Plan:   Informed Consent: I have reviewed the patients History and Physical, chart, labs and discussed the procedure including the risks, benefits and alternatives for the proposed anesthesia with the patient or authorized representative who has indicated his/her understanding and acceptance.       Plan Discussed with: Anesthesiologist  Anesthesia Plan Comments:         Anesthesia Quick Evaluation

## 2018-09-26 LAB — RPR: RPR Ser Ql: NONREACTIVE

## 2018-09-26 LAB — CBC
HCT: 30.9 % — ABNORMAL LOW (ref 36.0–46.0)
Hemoglobin: 10.1 g/dL — ABNORMAL LOW (ref 12.0–15.0)
MCH: 28.4 pg (ref 26.0–34.0)
MCHC: 32.7 g/dL (ref 30.0–36.0)
MCV: 86.8 fL (ref 80.0–100.0)
Platelets: 216 10*3/uL (ref 150–400)
RBC: 3.56 MIL/uL — ABNORMAL LOW (ref 3.87–5.11)
RDW: 13.4 % (ref 11.5–15.5)
WBC: 11.5 10*3/uL — ABNORMAL HIGH (ref 4.0–10.5)
nRBC: 0 % (ref 0.0–0.2)

## 2018-09-26 MED ORDER — MAGNESIUM OXIDE 400 (241.3 MG) MG PO TABS
400.0000 mg | ORAL_TABLET | Freq: Every day | ORAL | Status: DC
  Administered 2018-09-27: 10:00:00 400 mg via ORAL
  Filled 2018-09-26 (×2): qty 1

## 2018-09-26 MED ORDER — FERROUS SULFATE 325 (65 FE) MG PO TABS
325.0000 mg | ORAL_TABLET | Freq: Every day | ORAL | Status: DC
  Administered 2018-09-27: 08:00:00 325 mg via ORAL
  Filled 2018-09-26: qty 1

## 2018-09-26 NOTE — Progress Notes (Signed)
Patient ID: Regina Gray, female   DOB: August 10, 1990, 28 y.o.   MRN: [id number]   Post Partum Day # 1, s/p induced vaginal birth, Rh positive, postpartum anemia  Subjective:  Patient resting quietly in bed. Spouse at bedside holding infant.   Patient denies difficulty breathing or respiratory distress, chest pain, abdominal pain, excessive vaginal bleeding, dysuria, and leg pain.   Objective:  Temp:  [97.3 F (36.3 C)-98.7 F (37.1 C)] 98.6 F (37 C) (06/04 0751) Pulse Rate:  [55-110] 89 (06/04 0751) Resp:  [16-18] 18 (06/04 0751) BP: (101-153)/(50-91) 120/77 (06/04 0751) SpO2:  [95 %-98 %] 97 % (06/04 0751)  Physical Exam:   General: alert and cooperative   Lungs: clear to auscultation bilaterally  Breasts: normal appearance, no masses or tenderness  Heart: normal apical impulse  Abdomen: soft, non-tender; bowel sounds normal; no masses,  no organomegaly  Pelvis: Lochia: appropriate, Uterine Fundus: firm  Extremities: DVT Evaluation: Negative Homan's sign.  Recent Labs    09/24/18 2350 09/26/18 0608  HGB 11.5* 10.1*  HCT 34.6* 30.9*    Assessment:  28 year old female G1P1 Post Partum Day # 1, s/p induced vaginal birth, Rh positive, postpartum anemia  Breastfeeding  Plan:  Routine postpartum care and education.   Questions answered and reassure offered.   Lactation consult, see orders.   Iron supplementation, see orders.   Plan for discharge tomorrow   LOS: 2 days   Gunnar Bulla, CNM [facility], [facility] 09/26/2018 9:32 AM

## 2018-09-26 NOTE — Anesthesia Postprocedure Evaluation (Signed)
Anesthesia Post Note  Patient: Regina Gray  Procedure(s) Performed: AN AD HOC LABOR EPIDURAL  Patient location during evaluation: Mother Baby Anesthesia Type: Epidural Level of consciousness: awake and alert Pain management: pain level controlled Vital Signs Assessment: post-procedure vital signs reviewed and stable Respiratory status: spontaneous breathing, nonlabored ventilation and respiratory function stable Cardiovascular status: stable Postop Assessment: no headache, no backache and epidural receding Anesthetic complications: no     Last Vitals:  Vitals:   09/26/18 0045 09/26/18 0559  BP: 132/82 117/74  Pulse: 80 72  Resp:    Temp: 36.6 C 37.1 C  SpO2: 97% 97%    Last Pain:  Vitals:   09/26/18 0559  TempSrc: Oral  PainSc:                  Rica Mast

## 2018-09-27 ENCOUNTER — Ambulatory Visit: Payer: Self-pay

## 2018-09-27 LAB — SURGICAL PATHOLOGY

## 2018-09-27 MED ORDER — SERTRALINE HCL 25 MG PO TABS
25.0000 mg | ORAL_TABLET | Freq: Every day | ORAL | Status: DC
  Administered 2018-09-27: 10:00:00 25 mg via ORAL
  Filled 2018-09-27: qty 1

## 2018-09-27 MED ORDER — BENZOCAINE-MENTHOL 20-0.5 % EX AERO: 1.0000 "application " | INHALATION_SPRAY | CUTANEOUS | 1 refills | Status: DC | PRN

## 2018-09-27 MED ORDER — IBUPROFEN 600 MG PO TABS: 600.0000 mg | ORAL_TABLET | Freq: Four times a day (QID) | ORAL | 0 refills | Status: DC

## 2018-09-27 MED ORDER — LORAZEPAM 0.5 MG PO TABS: 0.5000 mg | ORAL_TABLET | Freq: Two times a day (BID) | ORAL | 0 refills | Status: DC | PRN

## 2018-09-27 MED ORDER — SERTRALINE HCL 25 MG PO TABS: 25.0000 mg | ORAL_TABLET | Freq: Every day | ORAL | 2 refills | Status: DC

## 2018-09-27 MED ORDER — LORAZEPAM 0.5 MG PO TABS: 0.5000 mg | ORAL_TABLET | Freq: Two times a day (BID) | ORAL | Status: DC | PRN

## 2018-09-27 NOTE — Progress Notes (Signed)
Contacted York Spaniel, [place] to notify of consult to social work and plan to discharge home.  EPDS score of 10.  Maxine Glenn states she will assess patient before discharge to home. Reynold Bowen, RN 09/27/2018 10:24 AM

## 2018-09-27 NOTE — Progress Notes (Signed)
Discharge instructions provided.  Pt and sig other verbalize understanding of all instructions and follow-up care.  Pt discharged to home with infant at 1416 on 09/27/18 via wheelchair by RN. Reynold Bowen, RN 09/27/2018 2:27 PM

## 2018-09-27 NOTE — Lactation Note (Signed)
This note was copied from a baby's chart. Lactation Consultation Note  Patient Name: Regina Gray AYOKH'T Date: 09/27/2018  Mom has lots of questions about breastfeeding, unsure of herself  Maternal Data  first baby  Feeding  baby latches easily and begins almost immediately after latching swallowing and gulping breastmilk, transitional stools noted   LATCH Score                   Interventions    Lactation Tools Discussed/Used  Mom's questions answered and mom reassured that baby is breastfeeding wonderfully with lots of milk that most first time mom's don't have!   Consult Status      Dyann Kief 09/27/2018, 8:52 PM

## 2018-09-27 NOTE — Clinical Social Work Maternal (Signed)
CLINICAL SOCIAL WORK MATERNAL/CHILD NOTE  Patient Details  Name: Regina Gray MRN: [id number] Date of Birth: [birth year]  Date:  09/27/2018  Clinical Social Worker Initiating Note:  York Spaniel MSW,LCSW Date/Time: Initiated:  09/27/18/      Child's Name:      Biological Parents:  Mother, Father   Need for Interpreter:  None   Reason for Referral:  Behavioral Health Concerns   Address:  [address] [place] [address]    Phone number:  [phone] (home)     Additional phone number: none  Household Members/Support Persons (HM/SP):       HM/SP Name Relationship DOB or Age  HM/SP -1        HM/SP -2        HM/SP -3        HM/SP -4        HM/SP -5        HM/SP -6        HM/SP -7        HM/SP -8          Natural Supports (not living in the home):  Immediate Family, Extended Family   Professional Supports: None   Employment:     Type of Work:     Education:      Homebound arranged:    Surveyor, quantity Resources:  Media planner   Other Resources:      Cultural/Religious Considerations Which May Impact Care:  none  Strengths:  Ability to meet basic needs , Compliance with medical plan , Home prepared for child    Psychotropic Medications:         Pediatrician:       Pediatrician List:   [facility] [place]    [place]      Pediatrician Fax Number:    Risk Factors/Current Problems:  Mental Health Concerns    Cognitive State:  Alert , Able to Concentrate , Goal Oriented , Insightful    Mood/Affect:  Calm , Bright    CSW Assessment: CSW asked to meet with patient due to score from the [place] Depression Scale she took. CSW spoke with patient and her husband at bedside. Patient was holding her newborn. She initially appeared reluctant to speak with CSW but then began to open up and was much brighter as conversation progressed. CSW explained reason for visit  and patient explained that she has not always had anxiety and that she has had several attacks in the past few years and then she explained the ones that she had while she has been in the hospital. Patient described herself having anxiety over having her first baby. She stated that her baby has two webbed fingers, a dimple on his sacrum and had a short umbilical cord. She stated that nursing at some point stated "could be this or it could be spina bifida" and she stated this made her fearful for her newborn. Patient explained that she began looking up on the web all of the medical conditions that could be wrong and that this just heightened her level of anxiety. She stated that she trusts her midwife, Efraim Kaufmann, and that she explained that these were not abnormalities that indicated anything was wrong. She stated after speaking with her midwife she felt much better. She stated that she was going to be started on zoloft again. Patient will have her husband in the home  and various other family members that will be coming to visit for support. They have all of their necessities and have no concerns regarding transportation. Patient did well in processing her fears and concerns to CSW and CSW provided supportive counseling.   CSW Plan/Description:  Perinatal Mood and Anxiety Disorder (PMADs) Education    [place] SpanielMonica Jay [place], [place] 09/27/2018, 11:51 AM

## 2018-09-27 NOTE — Discharge Instructions (Signed)
Please call your doctor or return to the ER if you experience any chest pains, shortness of breath, dizziness, visual changes, fever greater than 101, any heavy bleeding (saturating more than 1 pad per hour), large clots, or foul smelling discharge, any worsening abdominal pain and cramping that is not controlled by pain medication, any breast concerns (redness/pain), or any signs of postpartum depression. No tampons, enemas, douches, or sexual intercourse for 6 weeks. Also avoid tub baths, hot tubs, or swimming for 6 weeks.  No heavy lifting or strenuous activity for 6 weeks.  No driving for 2 weeks or while taking pain medications.  Continue prenatal vitamin and iron.  Increase calories and fluids while breastfeeding.

## 2018-09-27 NOTE — Discharge Summary (Signed)
Obstetric Discharge Summary  Patient ID: Taylen Joiner MRN: [id number] DOB/AGE: 05/26/1990 28 y.o.   Date of Admission: 09/24/2018  Date of Discharge:  09/27/18  Admitting Diagnosis: Induction of labor at [redacted]w[redacted]d  Secondary Diagnosis: Anemia in pregnancy, History of anxiety and depression, GERD in pregnancy, Group Beta Strep Positive  Mode of Delivery: normal spontaneous vaginal delivery     Discharge Diagnosis: No other diagnosis   Intrapartum Procedures: Atificial rupture of membranes, epidural, GBS prophylaxis and pitocin augmentation   Post partum procedures: None  Complications: vaginal laceration, repaired   Brief Hospital Course  Lital Zapp is a [id number] who had a SVD on 060/06/2018;  for further details of this birth, please refer to the delivey summary.  Patient had an uncomplicated postpartum course except for increased anxiety requiring medication. By time of discharge on PPD#2, her pain was controlled on oral pain medications; she had appropriate lochia and was ambulating, voiding without difficulty and tolerating regular diet.  She was deemed stable for discharge to home.    Labs: CBC Latest Ref Rng & Units 09/26/2018 09/24/2018 06/26/2018  WBC 4.0 - 10.5 K/uL 11.5(H) 9.9 7.2  Hemoglobin 12.0 - 15.0 g/dL 10.1(L) 11.5(L) 10.9(L)  Hematocrit 36.0 - 46.0 % 30.9(L) 34.6(L) 32.6(L)  Platelets 150 - 400 K/uL 216 270 242   A POS  Physical exam:   Temp:  [98.1 F (36.7 C)-98.7 F (37.1 C)] 98.1 F (36.7 C) (06/05 0010) Pulse Rate:  [57-89] 57 (06/05 0010) Resp:  [18-20] 18 (06/05 0010) BP: (110-129)/(66-82) 129/82 (06/05 0010) SpO2:  [97 %-99 %] 97 % (06/05 0010)  General: alert and no distress  Lochia: appropriate  Abdomen: soft, NT  Uterine Fundus: firm  Lacerations: healing well, no significant drainage, no dehiscence, no significant erythema  Extremities: No evidence of DVT seen on physical exam.   Discharge Instructions: Per After Visit  Summary.  Activity: Advance as tolerated. Pelvic rest for 6 weeks.  Also refer to After Visit Summary  Diet: Regular  Medications: Allergies as of 09/27/2018      Reactions   Codeine    Hydromorphone    Other reaction(s): Vomiting   Oxycodone    Other reaction(s): Vomiting   Sulfa Antibiotics       Medication List    STOP taking these medications   ondansetron 4 MG disintegrating tablet Commonly known as:  Zofran ODT   promethazine 25 MG tablet Commonly known as:  PHENERGAN     TAKE these medications   benzocaine-Menthol 20-0.5 % Aero Commonly known as:  DERMOPLAST Apply 1 application topically as needed for irritation (perineal discomfort).   ibuprofen 600 MG tablet Commonly known as:  ADVIL Take 1 tablet (600 mg total) by mouth every 6 (six) hours.   LORazepam 0.5 MG tablet Commonly known as:  ATIVAN Take 1 tablet (0.5 mg total) by mouth 2 (two) times daily as needed for anxiety.   omeprazole 20 MG capsule Commonly known as:  PRILOSEC Take 20 mg by mouth daily.   prenatal multivitamin Tabs tablet Take 1 tablet by mouth daily at 12 noon.   sertraline 25 MG tablet Commonly known as:  ZOLOFT Take 1 tablet (25 mg total) by mouth daily.      Outpatient follow up:  Follow-up Information    Gunnar Bulla, CNM. Schedule an appointment as soon as possible for a visit in 2 day(s).   Specialties:  Certified Nurse Midwife, Obstetrics and Gynecology, Radiology Why:  Please call to schedule one (  1) to two (2) week postpartum mood check with Serafina RoyalsMichelle Aritza Brunet, CNM Contact information: [address] [place] [id number] [phone]          Postpartum contraception: IUD, Skyla  Discharged Condition: stable  Discharged to: home   Newborn Data:  Disposition:home with mother  Apgars: APGAR (1 MIN): 8   APGAR (5 MINS): 9     Baby Feeding: Breast   Gunnar BullaJenkins Michelle Shelby Peltz, CNM [facility], [facility] 09/27/18 5:10  AM

## 2018-09-27 NOTE — Progress Notes (Signed)
Prenatal records indicate that pt received TDaP vaccine on 06/26/18. Reynold Bowen, RN 09/27/2018 10:27 AM

## 2018-10-01 ENCOUNTER — Encounter: Payer: Self-pay | Admitting: Certified Nurse Midwife

## 2018-10-10 ENCOUNTER — Telehealth: Payer: Self-pay | Admitting: *Deleted

## 2018-10-10 NOTE — Telephone Encounter (Signed)
Coronavirus (COVID-19) Are you at risk?  Are you at risk for the Coronavirus (COVID-19)?  To be considered HIGH RISK for Coronavirus (COVID-19), you have to meet the following criteria:  . Traveled to [place], [place], [place], [place] or [place]; or in the [place] to [place], [place], [place], or [place]; and have fever, cough, and shortness of breath within the last 2 weeks of travel OR . Been in close contact with a person diagnosed with COVID-19 within the last 2 weeks and have fever, cough, and shortness of breath . IF YOU DO NOT MEET THESE CRITERIA, YOU ARE CONSIDERED LOW RISK FOR COVID-19.  What to do if you are HIGH RISK for COVID-19?  . If you are having a medical emergency, call 911. . Seek medical care right away. Before you go to a doctor's office, urgent care or emergency department, call ahead and tell them about your recent travel, contact with someone diagnosed with COVID-19, and your symptoms. You should receive instructions from your physician's office regarding next steps of care.  . When you arrive at healthcare provider, tell the healthcare staff immediately you have returned from visiting [place], [place], [place], [place] or [place]; or traveled in the [place] to [place], [place], [place], or [place]; in the last two weeks or you have been in close contact with a person diagnosed with COVID-19 in the last 2 weeks.   . Tell the health care staff about your symptoms: fever, cough and shortness of breath. . After you have been seen by a medical provider, you will be either: o Tested for (COVID-19) and discharged home on quarantine except to seek medical care if symptoms worsen, and asked to  - Stay home and avoid contact with others until you get your results (4-5 days)  - Avoid travel on public transportation if possible (such as bus, train, or airplane) or o Sent to the Emergency Department by EMS for evaluation, COVID-19 testing, and possible  admission depending on your condition and test results.  What to do if you are LOW RISK for COVID-19?  Reduce your risk of any infection by using the same precautions used for avoiding the common cold or flu:  . Wash your hands often with soap and warm water for at least 20 seconds.  If soap and water are not readily available, use an alcohol-based hand sanitizer with at least 60% alcohol.  . If coughing or sneezing, cover your mouth and nose by coughing or sneezing into the elbow areas of your shirt or coat, into a tissue or into your sleeve (not your hands). . Avoid shaking hands with others and consider head nods or verbal greetings only. . Avoid touching your eyes, nose, or mouth with unwashed hands.  . Avoid close contact with people who are sick. . Avoid places or events with large numbers of people in one location, like concerts or sporting events. . Carefully consider travel plans you have or are making. . If you are planning any travel outside or inside the [place], visit the CDC's Travelers' Health webpage for the latest health notices. . If you have some symptoms but not all symptoms, continue to monitor at home and seek medical attention if your symptoms worsen. . If you are having a medical emergency, call 911.   ADDITIONAL HEALTHCARE OPTIONS FOR PATIENTS  [facility] / e-Visit: [email or web address]         [facility]: [phone]  [facility]: [phone]                   [facility]: [phone]   Spoke with pt denies any sx.  Amy Clontz, CMA 

## 2018-10-11 ENCOUNTER — Ambulatory Visit (INDEPENDENT_AMBULATORY_CARE_PROVIDER_SITE_OTHER): Payer: Commercial Managed Care - PPO | Admitting: Certified Nurse Midwife

## 2018-10-11 ENCOUNTER — Encounter: Payer: Self-pay | Admitting: Certified Nurse Midwife

## 2018-10-11 ENCOUNTER — Other Ambulatory Visit: Payer: Self-pay

## 2018-10-11 VITALS — BP 127/83 | HR 68 | Ht 69.0 in | Wt 179.6 lb

## 2018-10-11 DIAGNOSIS — F419 Anxiety disorder, unspecified: Secondary | ICD-10-CM

## 2018-10-11 DIAGNOSIS — F329 Major depressive disorder, single episode, unspecified: Secondary | ICD-10-CM

## 2018-10-11 MED ORDER — LORAZEPAM 0.5 MG PO TABS: 0.5000 mg | ORAL_TABLET | Freq: Every evening | ORAL | 0 refills | Status: DC | PRN

## 2018-10-11 MED ORDER — SERTRALINE HCL 50 MG PO TABS: 50.0000 mg | ORAL_TABLET | Freq: Every day | ORAL | 0 refills | Status: DC

## 2018-10-11 NOTE — Patient Instructions (Signed)
Perinatal Anxiety °When a woman feels excessive tension or worry (anxiety) during pregnancy or during the first 12 months after she gives birth, she has a condition called perinatal anxiety. Anxiety can interfere with work, school, relationships, and other everyday activities. If it is not managed properly, it can also cause problems in the mother and her baby.  °If you are pregnant and you have symptoms of an anxiety disorder, it is important to talk with your health care provider. °What are the causes? °The exact cause of this condition is not known. Hormonal changes during and after pregnancy may play a role in causing perinatal anxiety. °What increases the risk? °You are more likely to develop this condition if: °· You have a personal or family history of depression, anxiety, or mood disorders. °· You experience a stressful life event during pregnancy, such as the death of a loved one. °· You have a lot of regular life stress, such as being a single parent. °· You have thyroid problems. °What are the signs or symptoms? °Perinatal anxiety can be different for everyone. It may include: °· Panic attacks (panic disorder). These are intense episodes of fear or discomfort that may also cause sweating, nausea, shortness of breath, or fear of dying. They usually last 5-15 minutes. °· Reliving an upsetting (traumatic) event through distressing thoughts, dreams, or flashbacks (post-traumatic stress disorder, or PTSD). °· Excessive worry about multiple problems (generalized anxiety disorder). °· Fear and stress about leaving certain people or loved ones (separation anxiety). °· Performing repetitive tasks (compulsions) to relieve stress or worry (obsessive compulsive disorder, or OCD). °· Fear of certain objects or situations (phobias). °· Excessive worrying, such as a constant feeling that something bad is going to happen. °· Inability to relax. °· Difficulty concentrating. °· Sleep problems. °· Frequent nightmares or  disturbing thoughts. °How is this diagnosed? °This condition is diagnosed based on a physical exam and mental evaluation. In some cases, your health care provider may use an anxiety screening tool. These tools include a list of questions that can help a health care provider diagnose anxiety. Your health care provider may refer you to a mental health expert who specializes in anxiety. °How is this treated? °This condition may be treated with: °· Medicines. Your health care provider will only give you medicines that have been proven safe for pregnancy and breastfeeding. °· Talk therapy with a mental health professional to help change your patterns of thinking (cognitive behavioral therapy). °· Mindfulness-based stress reduction. °· Other relaxation therapies, such as deep breathing or guided muscle relaxation. °· Support groups. °Follow these instructions at home: °Lifestyle °· Do not use any products that contain nicotine or tobacco, such as cigarettes and e-cigarettes. If you need help quitting, ask your health care provider. °· Do not use alcohol when you are pregnant. After your baby is born, limit alcohol intake to no more than 1 drink a day. One drink equals 12 oz of beer, 5 oz of wine, or 1½ oz of hard liquor. °· Consider joining a support group for new mothers. Ask your health care provider for recommendations. °· Take good care of yourself. Make sure you: °? Get plenty of sleep. If you are having trouble sleeping, talk with your health care provider. °? Eat a healthy diet. This includes plenty of fruits and vegetables, whole grains, and lean proteins. °? Exercise regularly, as told by your health care provider. Ask your health care provider what exercises are safe for you. °General instructions °· Take over-the-counter   and prescription medicines only as told by your health care provider. °· Talk with your partner or family members about your feelings during pregnancy. Share any concerns or fears that you may  have. °· Ask for help with tasks or chores when you need it. Ask friends and family members to provide meals, watch your children, or help with cleaning. °· Keep all follow-up visits as told by your health care provider. This is important. °Contact a health care provider if: °· You (or people close to you) notice that you have any symptoms of anxiety or depression. °· You have anxiety and your symptoms get worse. °· You experience side effects from medicines, such as nausea or sleep problems. °Get help right away if: °· You feel like hurting yourself, your baby, or someone else. °If you ever feel like you may hurt yourself or others, or have thoughts about taking your own life, get help right away. You can go to your nearest emergency department or call: °· Your local emergency services (911 in the [place] °· A suicide crisis helpline, such as the National Suicide Prevention Lifeline at 1-800-273-8255. This is open 24 hours a day. °Summary °· Perinatal anxiety is when a woman feels excessive tension or worry during pregnancy or during the first 12 months after she gives birth. °· Perinatal anxiety may include panic attacks, post-traumatic stress disorder, separation anxiety, phobias, or generalized anxiety. °· Perinatal anxiety can cause physical health problems in the mother and baby if not properly managed. °· This condition is treated with medicines, talk therapy, stress reduction therapies, or a combination of two or more treatments. °· Talk with your partner or family members about your concerns or fears. Do not be afraid to ask for help. °This information is not intended to replace advice given to you by your health care provider. Make sure you discuss any questions you have with your health care provider. °Document Released: 06/07/2016 Document Revised: 06/07/2016 Document Reviewed: 06/07/2016 °Elsevier Interactive Patient Education © 2019 [employer] ° °

## 2018-10-11 NOTE — Progress Notes (Signed)
GYN ENCOUNTER NOTE  Subjective:       Regina Gray is a 28 y.o. [id number] female here for postpartum mood check after induced vaginal birth two (2) weeks ago.    "Feeling somewhat better". Notices fluctuation in moods. Didn't take ativan for sleep twice and felt like "her thought were racing".   Husband working from home. In-laws visiting until Monday. Continues to nurse infant without difficulty.   Denies difficulty breathing or respiratory distress, chest pain, abdominal pain, excessive vaginal bleeding, dysuria, and leg pain or swelling. No SI/HI.    Gynecologic History  No LMP recorded. (Menstrual status: Lactating).  Contraception: abstinence  Last Pap: 2018. 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  Past Surgical History:  Procedure Laterality Date  . ANTERIOR CRUCIATE LIGAMENT REPAIR    . APPENDECTOMY    . WISDOM TOOTH EXTRACTION      Current Outpatient Medications on File Prior to Visit  Medication Sig Dispense Refill  . LORazepam (ATIVAN) 0.5 MG tablet Take 1 tablet (0.5 mg total) by mouth 2 (two) times daily as needed for anxiety. 20 tablet 0  . sertraline (ZOLOFT) 25 MG tablet Take 1 tablet (25 mg total) by mouth daily. 30 tablet 2   No current facility-administered medications on file prior to visit.     Allergies  Allergen Reactions  . Codeine   . Hydromorphone     Other reaction(s): Vomiting  . Oxycodone     Other reaction(s): Vomiting  . Sulfa Antibiotics     Social History   Socioeconomic History  . Marital status: Married    Spouse name: Not on file   . Number of children: Not on file  . Years of education: Not on file  . Highest education level: Not on file  Occupational History  . Not on file  Social Needs  . Financial resource strain: Not on file  . Food insecurity    Worry: Not on file    Inability: Not on file  . Transportation needs    Medical: Not on file    Non-medical: Not on file  Tobacco Use  . Smoking status: Never Smoker  . Smokeless tobacco: Never Used  Substance and Sexual Activity  . Alcohol use: Not Currently    Comment: occasional   . Drug use: No  . Sexual activity: Not Currently    Birth control/protection: None  Lifestyle  . Physical activity    Days per week: Not on file    Minutes per session: Not on file  . Stress: Not on file  Relationships  . Social Musicianconnections    Talks on phone: Not on file    Gets together: Not on file    Attends religious service: Not on file    Active member of club or organization: Not on file    Attends meetings of clubs or organizations: Not on file  Relationship status: Not on file  . Intimate partner violence    Fear of current or ex partner: Not on file    Emotionally abused: Not on file    Physically abused: Not on file    Forced sexual activity: Not on file  Other Topics Concern  . Not on file  Social History Narrative  . Not on file    Family History  Problem Relation Age of Onset  . Heart attack Maternal Grandfather   . Healthy Mother   . Healthy Father   . Breast cancer Neg Hx   . Ovarian cancer Neg Hx   . Colon cancer Neg Hx   . Diabetes Neg Hx     The following portions of the patient's history were reviewed and updated as appropriate: allergies, current medications, past family history, past medical history, past social history, past surgical history and problem list.  Review of Systems  ROS negative except as noted above. Information obtained from patient.   Objective:   BP 127/83   Pulse 68   Ht 5\' 9"  (1.753 m)   Wt 179 lb 9.6 oz  (81.5 kg)   Breastfeeding Yes   BMI 26.52 kg/m    CONSTITUTIONAL: Well-developed, well-nourished female in no acute distress.   Depression screen [facility] 2/9 10/11/2018 07/10/2018 11/29/2017 07/16/2017 01/19/2017  Decreased Interest 1 1 1  0 0  Down, Depressed, Hopeless 1 0 1 0 1  PHQ - 2 Score 2 1 2  0 1  Altered sleeping 0 1 0 0 3  Tired, decreased energy 3 1 0 0 1  Change in appetite 0 0 0 0 0  Feeling bad or failure about yourself  1 0 1 0 0  Trouble concentrating 1 0 0 0 0  Moving slowly or fidgety/restless 0 0 0 0 0  Suicidal thoughts 0 0 0 0 0  PHQ-9 Score 7 3 3  0 5  Difficult doing work/chores Not difficult at all Somewhat difficult - - -   GAD 7 : Generalized Anxiety Score 10/11/2018 07/10/2018  Nervous, Anxious, on Edge 3 1  Control/stop worrying 1 0  Worry too much - different things 1 0  Trouble relaxing 3 1  Restless 0 0  Easily annoyed or irritable 0 0  Afraid - awful might happen 1 1  Total GAD 7 Score 9 3  Anxiety Difficulty Not difficult at all Somewhat difficult   Assessment:   1. Anxiety and depression   Plan:   Discussed coping techniques with new baby and family members.   Will increase Zoloft to 50 mg PO daily, see orders.   Will refill Ativan qHS PRN, see orders.   Reviewed red flag symptoms and when to call.   RTC x 4 weeks for PPV or sooner if needed.    Diona Fanti, CNM [facility], [facility]

## 2018-10-11 NOTE — Progress Notes (Signed)
Patient here for mood check.

## 2018-11-11 ENCOUNTER — Encounter: Payer: Self-pay | Admitting: Certified Nurse Midwife

## 2018-11-11 ENCOUNTER — Other Ambulatory Visit: Payer: Self-pay

## 2018-11-11 ENCOUNTER — Ambulatory Visit (INDEPENDENT_AMBULATORY_CARE_PROVIDER_SITE_OTHER): Payer: Commercial Managed Care - PPO | Admitting: Certified Nurse Midwife

## 2018-11-11 ENCOUNTER — Telehealth: Payer: Self-pay | Admitting: Lactation Services

## 2018-11-11 DIAGNOSIS — F32A Depression, unspecified: Secondary | ICD-10-CM

## 2018-11-11 DIAGNOSIS — F419 Anxiety disorder, unspecified: Secondary | ICD-10-CM

## 2018-11-11 DIAGNOSIS — Z8619 Personal history of other infectious and parasitic diseases: Secondary | ICD-10-CM

## 2018-11-11 DIAGNOSIS — F329 Major depressive disorder, single episode, unspecified: Secondary | ICD-10-CM

## 2018-11-11 NOTE — Patient Instructions (Signed)
Preventive Care 30-30 Years Old, Female Preventive care refers to visits with your health care provider and lifestyle choices that can promote health and wellness. This includes:  A yearly physical exam. This may also be called an annual well check.  Regular dental visits and eye exams.  Immunizations.  Screening for certain conditions.  Healthy lifestyle choices, such as eating a healthy diet, getting regular exercise, not using drugs or products that contain nicotine and tobacco, and limiting alcohol use. What can I expect for my preventive care visit? Physical exam Your health care provider will check your:  Height and weight. This may be used to calculate body mass index (BMI), which tells if you are at a healthy weight.  Heart rate and blood pressure.  Skin for abnormal spots. Counseling Your health care provider may ask you questions about your:  Alcohol, tobacco, and drug use.  Emotional well-being.  Home and relationship well-being.  Sexual activity.  Eating habits.  Work and work Statistician.  Method of birth control.  Menstrual cycle.  Pregnancy history. What immunizations do I need?  Influenza (flu) vaccine  This is recommended every year. Tetanus, diphtheria, and pertussis (Tdap) vaccine  You may need a Td booster every 10 years. Varicella (chickenpox) vaccine  You may need this if you have not been vaccinated. Human papillomavirus (HPV) vaccine  If recommended by your health care provider, you may need three doses over 6 months. Measles, mumps, and rubella (MMR) vaccine  You may need at least one dose of MMR. You may also need a second dose. Meningococcal conjugate (MenACWY) vaccine  One dose is recommended if you are age 49-21 years and a first-year college student living in a residence hall, or if you have one of several medical conditions. You may also need additional booster doses. Pneumococcal conjugate (PCV13) vaccine  You may need  this if you have certain conditions and were not previously vaccinated. Pneumococcal polysaccharide (PPSV23) vaccine  You may need one or two doses if you smoke cigarettes or if you have certain conditions. Hepatitis A vaccine  You may need this if you have certain conditions or if you travel or work in places where you may be exposed to hepatitis A. Hepatitis B vaccine  You may need this if you have certain conditions or if you travel or work in places where you may be exposed to hepatitis B. Haemophilus influenzae type b (Hib) vaccine  You may need this if you have certain conditions. You may receive vaccines as individual doses or as more than one vaccine together in one shot (combination vaccines). Talk with your health care provider about the risks and benefits of combination vaccines. What tests do I need?  Blood tests  Lipid and cholesterol levels. These may be checked every 5 years starting at age 90.  Hepatitis C test.  Hepatitis B test. Screening  Diabetes screening. This is done by checking your blood sugar (glucose) after you have not eaten for a while (fasting).  Sexually transmitted disease (STD) testing.  BRCA-related cancer screening. This may be done if you have a family history of breast, ovarian, tubal, or peritoneal cancers., ovarian, tubal, or peritoneal cancers.  Pelvic exam and Pap test. This may be done every 3 years starting at age 27. Starting at age 49, this may be done every 5 years if you have a Pap test in combination with an HPV test. Talk with your health care provider about your test results, treatment options, and if necessary, the need for more tests.  Follow these instructions at home: Eating and drinking   Eat a diet that includes fresh fruits and vegetables, whole grains, lean protein, and low-fat dairy.  Take vitamin and mineral supplements as recommended by your health care provider.  Do not drink alcohol if: ? Your health care provider tells you not to drink. ? You are  pregnant, may be pregnant, or are planning to become pregnant.  If you drink alcohol: ? Limit how much you have to 0-1 drink a day. ? Be aware of how much alcohol is in your drink. In the [place] one drink equals one 12 oz bottle of beer (355 mL), one 5 oz glass of wine (148 mL), or one 1 oz glass of hard liquor (44 mL). Lifestyle  Take daily care of your teeth and gums.  Stay active. Exercise for at least 30 minutes on 5 or more days each week.  Do not use any products that contain nicotine or tobacco, such as cigarettes, e-cigarettes, and chewing tobacco. If you need help quitting, ask your health care provider.  If you are sexually active, practice safe sex. Use a condom or other form of birth control (contraception) in order to prevent pregnancy and STIs (sexually transmitted infections). If you plan to become pregnant, see your health care provider for a preconception visit. What's next?  Visit your health care provider once a year for a well check visit.  Ask your health care provider how often you should have your eyes and teeth checked.  Stay up to date on all vaccines. This information is not intended to replace advice given to you by your health care provider. Make sure you discuss any questions you have with your health care provider. Document Released: 06/06/2001 Document Revised: 12/20/2017 Document Reviewed: 12/20/2017 Elsevier Patient Education  2020 [place]  Natural Family Planning (NFP) is a type of birth control (contraception) in which no form of contraceptive medicine or device is used. The NFP method relies on knowing which days of the month a woman's ovary is producing an egg (ovulation). Ovulation is the time in the menstrual cycle when a woman is most fertile and, therefore, most likely to become pregnant. To lower the chance of pregnancy, sex is avoided during ovulation. NFP is a safe method of birth control and can prevent  pregnancy if it is done correctly. However, NFP does not provide protection from sexually transmitted diseases. NFP can also be used as a method of getting pregnant, by deciding to have sex during ovulation. How does the NFP method work? NFP works by making both sexual partners aware of how the woman's body functions during her menstrual cycle.  Usually, a woman has a menstrual period every 28-30 days. However, there can be 23-35 days between each menstrual period. This varies for each woman. A woman with a 28-day menstrual cycle has about 6 days a month when she is most likely to get pregnant.  Ovulation happens 12-14 days before the start of the next menstrual period. There are different methods that are used to determine when ovulation starts.  An egg is fertile for 24 hours after it is released from the ovary. Sperm can live for 3 days or more. What NFP methods can be used to prevent pregnancy? The basal body temperature method During ovulation, there is often a slight increase in body temperature. To use this method:  Take your temperature every morning before getting out of bed. Write the temperature on a  chart.  Do not have sex from the day the menstrual periods starts until 3 days after the increase in temperature. Note that body temperature may increase as a result of various factors, including fever, restless sleep, and working schedules. The cervical mucus method Right before ovulation, mucus from the lower part of the uterus (cervix) changes from dry and sticky to wet and slippery. To use this method:  Check the mucus every day to look for these changes. Ovulation happens on the last day of wet, slippery mucus.  Do not have sex starting when you first see wet, slippery mucus and until 4 days after it stops or returns to its normal consistency.  With this method, it is safe to have sex: ? After the 4 days have passed, and until 10 days after the menstrual period starts. ? On days  when mucus is dry. Note that cervical mucus can increase or change consistency due to reasons other than ovulation, such as infection, lubricants, some medicines, and sexual arousal. Other variations of the cervical mucus method include the TwoDay method, [place] ovulation method, and the [employer]. The symptothermal method This method combines the basal body temperature and the cervical mucus methods. The calendar method This method involves tracking menstrual cycles to determine when ovulation occurs. This method is helpful when the menstrual cycle varies in length. To use this method:  For 6 months, record when menstrual periods start and end and the length of each menstrual cycle. The length of a menstrual cycle is from day 1 of the present menstrual period to day 1 of the next menstrual period.  Use this information to determine when you will likely ovulate. Avoid sex during that time. You may need help from your health care provider to determine which days you are most likely to get pregnant. ? Ovulation usually happens 12-14 days before the start of the next menstrual period. ? Light vaginal bleeding (spotting) or abdominal cramps during the middle of a menstrual cycle may be signs of ovulation. However, not all women have these symptoms. The standard days method This method is based on studies of women's hormone levels throughout normal menstrual cycles. Based on these studies, a standard rule was developed to predict when women are most fertile during their menstrual cycle. According to the rule, if your cycle is 26-32 days long, you are most fertile between days 8 and 19. To prevent pregnancy, you should avoid having sex during this time, or use a barrier method of birth control. This method works best if your cycle is regularly between 26-32 days long. When should I not use the NFP method? You should not use NFP if:  You have very irregular menstrual periods or you sometimes skip  a menstrual period.  You have abnormal vaginal bleeding.  You recently had a baby or are breastfeeding.  You have a vaginal or cervical infection.  You take medicines that can affect vaginal mucus or body temperature. These medicines include antibiotics, thyroid medicines, and antihistamines that are found in some cold and allergy medicines.  You absolutely do not want to become pregnant at the current time. Other methods of contraception are more effective at preventing pregnancy than NFP.  You are concerned about sexually transmitted diseases. Summary  Natural Family Planning methods help women and their partners to understand how to avoid pregnancy without using medicines or other methods.  A woman learns to recognize her most fertile days. A woman with a 28 day menstrual cycle has  about 6 days per month when she can get pregnant. This information is not intended to replace advice given to you by your health care provider. Make sure you discuss any questions you have with your health care provider. Document Released: 09/27/2007 Document Revised: 08/02/2018 Document Reviewed: 05/29/2016 Elsevier Patient Education  2020 [place].  Breast Pumping Tips Breast pumping is a way to get milk out of your breasts. You will then store the milk for your baby to use when you are away from home. There are three ways to pump. You can:  Use your hand to massage and squeeze your breast (hand expression).  Use a hand-held machine to manually pump your milk.  Use an electric machine to pump your milk. In the beginning you may not get much milk. After a few days your breasts should make more. Pumping can help you start making milk after your baby is born. Pumping helps you to keep making milk when you are away from your baby. When should I pump? You can start pumping soon after your baby is born. Follow these tips:  When you are with your baby: ? Pump after you breastfeed. ? Pump from the  free breast while you breastfeed.  When you are away from your baby: ? Pump every 2-3 hours for 15 minutes. ? Pump both breasts at the same time if you can.  If your baby drinks formula, pump around the time your baby gets the formula.  If you drank alcohol, wait 2 hours before you pump.  If you are going to have surgery, ask your doctor when you should pump again. How do I get ready to pump? Take steps to relax. Try these things to help your milk come in:  Smell your baby's blanket or clothes.  Look at a picture or video of your baby.  Sit in a quiet, private space.  Massage your breast and nipple.  Place a cloth on your breast. The cloth should be warm and a little wet.  Play relaxing music.  Picture your milk flowing. What are some tips? General tips for pumping breast milk   Always wash your hands before pumping.  If you do not get much milk or if pumping hurts, try different pump settings or a different kind of pump.  Drink enough fluid so your pee (urine) is clear or pale yellow.  Wear clothing that opens in the front or is easy to take off.  Pump milk into a clean bottle or container.  Do not use anything that has nicotine or tobacco. Examples are cigarettes and e-cigarettes. If you need help quitting, ask your doctor. Tips for storing breast milk   Store breast milk in a clean, BPA-free container. These include: ? A glass or plastic bottle. ? A milk storage bag.  Store only 2-4 ounces of breast milk in each container.  Swirl the breast milk in the container. Do not shake it.  Write down the date you pumped the milk on the container.  This is how long you can store breast milk: ? Room temperature: 6-8 hours. It is best to use the milk within 4 hours. ? Cooler with ice packs: 24 hours. ? Refrigerator: 5-8 days, if the milk is clean. It is best to use the milk within 3 days. ? Freezer: 9-12 months, if the milk is clean and stored away from the freezer  door. It is best to use the milk within 6 months.  Put milk in the back  of the refrigerator or freezer.  Thaw frozen milk using warm water. Do not use the microwave. Tips for choosing a breast pump When choosing a pump, keep the following things in mind:  Manual breast pumps do not need electricity. They cost less. They can be hard to use.  Electric breast pumps use electricity. They are more expensive. They are easier to use. They collect more milk.  The suction cup (flange) should be the right size.  Before you buy the pump, check if your insurance will pay for it. Tips for caring for a breast pump  Check the manual that came with your pump for cleaning tips.  Clean the pump after you use it. To do this: 1. Wipe down the electrical part. Use a dry cloth or paper towel. Do not put this part in water or in cleaning products. 2. Wash the plastic parts with soap and warm water. Or use the dishwasher if the manual says it is safe. You do not need to clean the tubing unless it touched breast milk. 3. Let all the parts air dry. Avoid drying them with a cloth or towel. 4. When the parts are clean and dry, put the pump back together. Then store the pump.  If there is water in the tubing when you want to pump: 1. Attach the tubing to the pump. 2. Turn on the pump. 3. Turn off the pump when the tube is dry.  Try not to touch the inside of pump parts. Summary  Pumping can help you start making milk after your baby is born. It lets you keep making milk when you are away from your baby.  When you are away from your baby, pump for about 15 minutes every 2-3 hours. Pump both breasts at the same time, if you can. This information is not intended to replace advice given to you by your health care provider. Make sure you discuss any questions you have with your health care provider. Document Released: 09/27/2007 Document Revised: 07/31/2018 Document Reviewed: 05/15/2016 Elsevier Patient  Education  2020 [employer].

## 2018-11-11 NOTE — Progress Notes (Signed)
Subjective:    Regina Gray is a 28 y.o. G52P1011 Caucasian female who presents for a postpartum visit. She is 7 weeks postpartum following a spontaneous vaginal delivery at 41+2 gestational weeks. Anesthesia: epidural. I have fully reviewed the prenatal and intrapartum course.   Postpartum course has been complicated by anxiety and depression. Baby's course has been uncomplicated except for single episode of oral thrush, treatment just completed.   Baby is feeding by breast. Bleeding no bleeding. Bowel function is normal. Bladder function is normal.   Patient is not sexually active. Contraception method is condoms. Postpartum depression screening: negative. Score 3.  Last pap 10/2016 and was negative.  Denies difficulty breathing or respiratory distress, chest pain, abdominal pain, excessive vaginal bleeding, dysuria, and leg pain or swelling.   The following portions of the patient's history were reviewed and updated as appropriate: allergies, current medications, past medical history, past surgical history and problem list.  Review of Systems  Pertinent items are noted in HPI.   Objective:   BP 124/79   Pulse (!) 56   Ht 5\' 9"  (1.753 m)   Wt 174 lb 3.2 oz (79 kg)   Breastfeeding Yes   BMI 25.72 kg/m   General:  alert, cooperative and no distress   Breasts:  Bilateral nipple redness, flaky skin to tip of right nipple  Lungs: clear to auscultation bilaterally  Heart:  regular rate and rhythm  Abdomen: soft, nontender   Vulva: normal  Vagina: normal vagina  Cervix:  closed  Corpus: Well-involuted  Adnexa:  Non-palpable        Depression screen [facility] 2/9 11/11/2018 10/11/2018 07/10/2018 11/29/2017 07/16/2017  Decreased Interest 0 1 1 1  0  Down, Depressed, Hopeless 1 1 0 1 0  PHQ - 2 Score 1 2 1 2  0  Altered sleeping 0 0 1 0 0  Tired, decreased energy 1 3 1  0 0  Change in appetite 0 0 0 0 0  Feeling bad or failure about yourself  1 1 0 1 0  Trouble concentrating 0 1 0 0 0   Moving slowly or fidgety/restless 0 0 0 0 0  Suicidal thoughts 0 0 0 0 0  PHQ-9 Score 3 7 3 3  0  Difficult doing work/chores Not difficult at all Not difficult at all Somewhat difficult - -   GAD 7 : Generalized Anxiety Score 11/11/2018 10/11/2018 07/10/2018  Nervous, Anxious, on Edge 1 3 1   Control/stop worrying 1 1 0  Worry too much - different things 0 1 0  Trouble relaxing 0 3 1  Restless 0 0 0  Easily annoyed or irritable 0 0 0  Afraid - awful might happen 2 1 1   Total GAD 7 Score 4 9 3   Anxiety Difficulty Not difficult at all Not difficult at all Somewhat difficult   Assessment:   Postpartum exam Seven (7) wks s/p spontaneous vaginal birth Breastfeeding/Pumping Depression screening Contraception counseling   Plan:   Rx: All Purpose Nipple Ointment, see orders  May resume activities without restricition  Encouraged routine health maintenance techniques  Follow up in: 7 months for ANNUAL EXAM and PAP or earlier if needed   Diona Fanti, CNM [facility], [facility] 11/11/18 11:29 AM

## 2018-11-11 NOTE — Progress Notes (Signed)
Patient here for post-partum exam.

## 2018-12-13 ENCOUNTER — Encounter: Payer: Self-pay | Admitting: Certified Nurse Midwife

## 2018-12-13 ENCOUNTER — Other Ambulatory Visit: Payer: Self-pay

## 2018-12-13 MED ORDER — SERTRALINE HCL 50 MG PO TABS: 50.0000 mg | ORAL_TABLET | Freq: Every day | ORAL | 6 refills | Status: DC

## 2019-02-20 ENCOUNTER — Encounter: Payer: Self-pay | Admitting: Certified Nurse Midwife

## 2019-02-21 ENCOUNTER — Encounter: Payer: Commercial Managed Care - PPO | Admitting: Certified Nurse Midwife

## 2019-03-03 ENCOUNTER — Ambulatory Visit (INDEPENDENT_AMBULATORY_CARE_PROVIDER_SITE_OTHER): Payer: Commercial Managed Care - PPO | Admitting: Certified Nurse Midwife

## 2019-03-03 ENCOUNTER — Other Ambulatory Visit: Payer: Self-pay

## 2019-03-03 ENCOUNTER — Encounter: Payer: Self-pay | Admitting: Certified Nurse Midwife

## 2019-03-03 VITALS — BP 118/75 | HR 73 | Ht 69.0 in | Wt 174.2 lb

## 2019-03-03 DIAGNOSIS — M542 Cervicalgia: Secondary | ICD-10-CM | POA: Diagnosis not present

## 2019-03-03 DIAGNOSIS — M545 Low back pain, unspecified: Secondary | ICD-10-CM

## 2019-03-03 DIAGNOSIS — G8929 Other chronic pain: Secondary | ICD-10-CM

## 2019-03-03 NOTE — Patient Instructions (Signed)
Cervical Sprain  A cervical sprain is a stretch or tear in one or more of the tough, cord-like tissues that connect bones (ligaments) in the neck. Cervical sprains can range from mild to severe. Severe cervical sprains can cause the spinal bones (vertebrae) in the neck to be unstable. This can lead to spinal cord damage and can result in serious nervous system problems. The amount of time that it takes for a cervical sprain to get better depends on the cause and extent of the injury. Most cervical sprains heal in 4-6 weeks. What are the causes? Cervical sprains may be caused by an injury (trauma), such as from a motor vehicle accident, a fall, or sudden forward and backward whipping movement of the head and neck (whiplash injury). Mild cervical sprains may be caused by wear and tear over time, such as from poor posture, sitting in a chair that does not provide support, or looking up or down for long periods of time. What increases the risk? The following factors may make you more likely to develop this condition:  Participating in activities that have a high risk of trauma to the neck. These include contact sports, auto racing, gymnastics, and diving.  Taking risks when driving or riding in a motor vehicle, such as speeding.  Having osteoarthritis of the spine.  Having poor strength and flexibility of the neck.  A previous neck injury.  Having poor posture.  Spending a lot of time in certain positions that put stress on the neck, such as sitting at a computer for long periods of time. What are the signs or symptoms? Symptoms of this condition include:  Pain, soreness, stiffness, tenderness, swelling, or a burning sensation in the front, back, or sides of the neck.  Sudden tightening of neck muscles that you cannot control (muscle spasms).  Pain in the shoulders or upper back.  Limited ability to move the neck.  Headache.  Dizziness.  Nausea.  Vomiting.  Weakness, numbness,  or tingling in a hand or an arm. Symptoms may develop right away after injury, or they may develop over a few days. In some cases, symptoms may go away with treatment and return (recur) over time. How is this diagnosed? This condition may be diagnosed based on:  Your medical history.  Your symptoms.  Any recent injuries or known neck problems that you have, such as arthritis in the neck.  A physical exam.  Imaging tests, such as: ? X-rays. ? MRI. ? CT scan. How is this treated? This condition is treated by resting and icing the injured area and doing physical therapy exercises. Depending on the severity of your condition, treatment may also include:  Keeping your neck in place (immobilized) for periods of time. This may be done using: ? A cervical collar. This supports your chin and the back of your head. ? A cervical traction device. This is a sling that holds up your head. This removes weight and pressure from your neck, and it may help to relieve pain.  Medicines that help to relieve pain and inflammation.  Medicines that help to relax your muscles (muscle relaxants).  Surgery. This is rare. Follow these instructions at home: If you have a cervical collar:   Wear it as told by your health care provider. Do not remove the collar unless instructed by your health care provider.  Ask your health care provider before you make any adjustments to your collar.  If you have long hair, keep it outside of the   collar.  Ask your health care provider if you can remove the collar for cleaning and bathing. If you are allowed to remove the collar for cleaning or bathing: ? Follow instructions from your health care provider about how to remove the collar safely. ? Clean the collar by wiping it with mild soap and water and drying it completely. ? If your collar has removable pads, remove them every 1-2 days and wash them by hand with soap and water. Let them air-dry completely before you  put them back in the collar. ? Check your skin under the collar for irritation or sores. If you see any, tell your health care provider. Managing pain, stiffness, and swelling   If directed, use a cervical traction device as told by your health care provider.  If directed, apply heat to the affected area before you do your physical therapy or as often as told by your health care provider. Use the heat source that your health care provider recommends, such as a moist heat pack or a heating pad. ? Place a towel between your skin and the heat source. ? Leave the heat on for 20-30 minutes. ? Remove the heat if your skin turns bright red. This is especially important if you are unable to feel pain, heat, or cold. You may have a greater risk of getting burned.  If directed, put ice on the affected area: ? Put ice in a plastic bag. ? Place a towel between your skin and the bag. ? Leave the ice on for 20 minutes, 2-3 times a day. Activity  Do not drive while wearing a cervical collar. If you do not have a cervical collar, ask your health care provider if it is safe to drive while your neck heals.  Do not drive or use heavy machinery while taking prescription pain medicine or muscle relaxants, unless your health care provider approves.  Do not lift anything that is heavier than 10 lb (4.5 kg) until your health care provider tells you that it is safe.  Rest as directed by your health care provider. Avoid positions and activities that make your symptoms worse. Ask your health care provider what activities are safe for you.  If physical therapy was prescribed, do exercises as told by your health care provider or physical therapist. General instructions  Take over-the-counter and prescription medicines only as told by your health care provider.  Do not use any products that contain nicotine or tobacco, such as cigarettes and e-cigarettes. These can delay healing. If you need help quitting, ask your  health care provider.  Keep all follow-up visits as told by your health care provider or physical therapist. This is important. How is this prevented? To prevent a cervical sprain from happening again:  Use and maintain good posture. Make any needed adjustments to your workstation to help you use good posture.  Exercise regularly as directed by your health care provider or physical therapist.  Avoid risky activities that may cause a cervical sprain. Contact a health care provider if:  You have symptoms that get worse or do not get better after 2 weeks of treatment.  You have pain that gets worse or does not get better with medicine.  You develop new, unexplained symptoms.  You have sores or irritated skin on your neck from wearing your cervical collar. Get help right away if:  You have severe pain.  You develop numbness, tingling, or weakness in any part of your body.  You cannot move   a part of your body (you have paralysis).  You have neck pain along with: ? Severe dizziness. ? Headache. Summary  A cervical sprain is a stretch or tear in one or more of the tough, cord-like tissues that connect bones (ligaments) in the neck.  Cervical sprains may be caused by an injury (trauma), such as from a motor vehicle accident, a fall, or sudden forward and backward whipping movement of the head and neck (whiplash injury).  Symptoms may develop right away after injury, or they may develop over a few days.  This condition is treated by resting and icing the injured area and doing physical therapy exercises. This information is not intended to replace advice given to you by your health care provider. Make sure you discuss any questions you have with your health care provider. Document Released: 02/05/2007 Document Revised: 07/31/2018 Document Reviewed: 12/08/2015 Elsevier Patient Education  2020 [employer] Chronic Back Pain When back pain lasts longer than 3 months, it is called  chronic back pain.The cause of your back pain may not be known. Some common causes include:  Wear and tear (degenerative disease) of the bones, ligaments, or disks in your back.  Inflammation and stiffness in your back (arthritis). People who have chronic back pain often go through certain periods in which the pain is more intense (flare-ups). Many people can learn to manage the pain with home care. Follow these instructions at home: Pay attention to any changes in your symptoms. Take these actions to help with your pain: Activity   Avoid bending and other activities that make the problem worse.  Maintain a proper position when standing or sitting: ? When standing, keep your upper back and neck straight, with your shoulders pulled back. Avoid slouching. ? When sitting, keep your back straight and relax your shoulders. Do not round your shoulders or pull them backward.  Do not sit or stand in one place for long periods of time.  Take brief periods of rest throughout the day. This will reduce your pain. Resting in a lying or standing position is usually better than sitting to rest.  When you are resting for longer periods, mix in some mild activity or stretching between periods of rest. This will help to prevent stiffness and pain.  Get regular exercise. Ask your health care provider what activities are safe for you.  Do not lift anything that is heavier than 10 lb (4.5 kg). Always use proper lifting technique, which includes: ? Bending your knees. ? Keeping the load close to your body. ? Avoiding twisting.  Sleep on a firm mattress in a comfortable position. Try lying on your side with your knees slightly bent. If you lie on your back, put a pillow under your knees. Managing pain  If directed, apply ice to the painful area. Your health care provider may recommend applying ice during the first 24-48 hours after a flare-up begins. ? Put ice in a plastic bag. ? Place a towel between  your skin and the bag. ? Leave the ice on for 20 minutes, 2-3 times per day.  If directed, apply heat to the affected area as often as told by your health care provider. Use the heat source that your health care provider recommends, such as a moist heat pack or a heating pad. ? Place a towel between your skin and the heat source. ? Leave the heat on for 20-30 minutes. ? Remove the heat if your skin turns bright red. This is especially  important if you are unable to feel pain, heat, or cold. You may have a greater risk of getting burned.  Try soaking in a warm tub.  Take over-the-counter and prescription medicines only as told by your health care provider.  Keep all follow-up visits as told by your health care provider. This is important. Contact a health care provider if:  You have pain that is not relieved with rest or medicine. Get help right away if:  You have weakness or numbness in one or both of your legs or feet.  You have trouble controlling your bladder or your bowels.  You have nausea or vomiting.  You have pain in your abdomen.  You have shortness of breath or you faint. This information is not intended to replace advice given to you by your health care provider. Make sure you discuss any questions you have with your health care provider. Document Released: 05/18/2004 Document Revised: 08/01/2018 Document Reviewed: 10/18/2016 Elsevier Patient Education  2020 [employer].
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
## 2019-03-25 ENCOUNTER — Ambulatory Visit: Payer: Commercial Managed Care - PPO | Attending: Certified Nurse Midwife | Admitting: Physical Therapy

## 2019-03-25 ENCOUNTER — Encounter: Payer: Self-pay | Admitting: Physical Therapy

## 2019-03-25 ENCOUNTER — Other Ambulatory Visit: Payer: Self-pay

## 2019-03-25 DIAGNOSIS — M25551 Pain in right hip: Secondary | ICD-10-CM | POA: Diagnosis present

## 2019-03-25 DIAGNOSIS — M542 Cervicalgia: Secondary | ICD-10-CM | POA: Diagnosis present

## 2019-03-25 DIAGNOSIS — M533 Sacrococcygeal disorders, not elsewhere classified: Secondary | ICD-10-CM | POA: Diagnosis present

## 2019-03-25 DIAGNOSIS — M62830 Muscle spasm of back: Secondary | ICD-10-CM

## 2019-03-25 DIAGNOSIS — G8929 Other chronic pain: Secondary | ICD-10-CM | POA: Insufficient documentation

## 2019-03-25 DIAGNOSIS — M25512 Pain in left shoulder: Secondary | ICD-10-CM | POA: Insufficient documentation
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
## 2019-03-27 ENCOUNTER — Ambulatory Visit: Payer: Commercial Managed Care - PPO | Admitting: Physical Therapy

## 2019-03-27 ENCOUNTER — Encounter: Payer: Self-pay | Admitting: Family Medicine

## 2019-03-27 ENCOUNTER — Ambulatory Visit (INDEPENDENT_AMBULATORY_CARE_PROVIDER_SITE_OTHER): Payer: Commercial Managed Care - PPO | Admitting: Family Medicine

## 2019-03-27 ENCOUNTER — Other Ambulatory Visit: Payer: Self-pay

## 2019-03-27 ENCOUNTER — Encounter: Payer: Self-pay | Admitting: Physical Therapy

## 2019-03-27 DIAGNOSIS — G8929 Other chronic pain: Secondary | ICD-10-CM

## 2019-03-27 DIAGNOSIS — M533 Sacrococcygeal disorders, not elsewhere classified: Secondary | ICD-10-CM | POA: Diagnosis not present

## 2019-03-27 DIAGNOSIS — M5441 Lumbago with sciatica, right side: Secondary | ICD-10-CM

## 2019-03-27 DIAGNOSIS — M999 Biomechanical lesion, unspecified: Secondary | ICD-10-CM | POA: Diagnosis not present

## 2019-03-27 DIAGNOSIS — M25551 Pain in right hip: Secondary | ICD-10-CM

## 2019-03-27 DIAGNOSIS — M62830 Muscle spasm of back: Secondary | ICD-10-CM

## 2019-03-27 MED ORDER — VITAMIN D (ERGOCALCIFEROL) 1.25 MG (50000 UNIT) PO CAPS: 50000.0000 [IU] | ORAL_CAPSULE | ORAL | 0 refills | Status: DC
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
## 2019-03-27 NOTE — Patient Instructions (Addendum)
Good to see you.  Ice 20 minutes 2 times daily. Usually after activity and before bed. Exercises 3 times a week.  Continue PT Once weekly vitamin D for 12 weeks.  See me again in 6 weeks        [address] floor [place], [place] [address] Phone [phone]

## 2019-03-28 ENCOUNTER — Encounter: Payer: Self-pay | Admitting: Family Medicine

## 2019-03-28 DIAGNOSIS — M999 Biomechanical lesion, unspecified: Secondary | ICD-10-CM | POA: Insufficient documentation

## 2019-03-28 DIAGNOSIS — M549 Dorsalgia, unspecified: Secondary | ICD-10-CM | POA: Insufficient documentation

## 2019-03-28 NOTE — Assessment & Plan Note (Signed)
Patient is admitted in July seems to be multifactorial.  Seems to be some muscle imbalances.  Given home exercise, icing regimen, with activity which wants to avoid.  Discussed icing regimen, topical anti-inflammatories.  Patient did respond fairly well to her osteopath manipulation.  Follow-up again in 4 to 8 weeks

## 2019-03-28 NOTE — Assessment & Plan Note (Signed)
Decision today to treat with OMT was based on Physical Exam  After verbal consent patient was treated with HVLA, ME, FPR techniques in cervical, thoracic, rib, lumbar and sacral areas  Patient tolerated the procedure well with improvement in symptoms  Patient given exercises, stretches and lifestyle modifications  See medications in patient instructions if given  Patient will follow up in 4-6 weeks 

## 2019-04-01 ENCOUNTER — Ambulatory Visit: Payer: Commercial Managed Care - PPO | Admitting: Physical Therapy

## 2019-04-01 ENCOUNTER — Other Ambulatory Visit: Payer: Self-pay

## 2019-04-01 ENCOUNTER — Encounter: Payer: Self-pay | Admitting: Physical Therapy

## 2019-04-01 DIAGNOSIS — M533 Sacrococcygeal disorders, not elsewhere classified: Secondary | ICD-10-CM | POA: Diagnosis not present

## 2019-04-01 DIAGNOSIS — M25512 Pain in left shoulder: Secondary | ICD-10-CM

## 2019-04-01 DIAGNOSIS — M542 Cervicalgia: Secondary | ICD-10-CM

## 2019-04-01 DIAGNOSIS — M25551 Pain in right hip: Secondary | ICD-10-CM

## 2019-04-01 DIAGNOSIS — M62830 Muscle spasm of back: Secondary | ICD-10-CM
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
## 2019-04-03 ENCOUNTER — Ambulatory Visit: Payer: Commercial Managed Care - PPO | Admitting: Physical Therapy

## 2019-04-03 ENCOUNTER — Encounter: Payer: Self-pay | Admitting: Physical Therapy

## 2019-04-03 ENCOUNTER — Other Ambulatory Visit: Payer: Self-pay

## 2019-04-03 DIAGNOSIS — M62830 Muscle spasm of back: Secondary | ICD-10-CM

## 2019-04-03 DIAGNOSIS — M533 Sacrococcygeal disorders, not elsewhere classified: Secondary | ICD-10-CM

## 2019-04-03 DIAGNOSIS — M25551 Pain in right hip: Secondary | ICD-10-CM
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
## 2019-04-08 ENCOUNTER — Other Ambulatory Visit: Payer: Self-pay

## 2019-04-08 ENCOUNTER — Encounter: Payer: Self-pay | Admitting: Physical Therapy

## 2019-04-08 ENCOUNTER — Ambulatory Visit: Payer: Commercial Managed Care - PPO | Admitting: Physical Therapy

## 2019-04-08 DIAGNOSIS — G8929 Other chronic pain: Secondary | ICD-10-CM

## 2019-04-08 DIAGNOSIS — M533 Sacrococcygeal disorders, not elsewhere classified: Secondary | ICD-10-CM

## 2019-04-08 DIAGNOSIS — M542 Cervicalgia: Secondary | ICD-10-CM

## 2019-04-08 DIAGNOSIS — M62830 Muscle spasm of back: Secondary | ICD-10-CM

## 2019-04-08 DIAGNOSIS — M25512 Pain in left shoulder: Secondary | ICD-10-CM

## 2019-04-08 DIAGNOSIS — M25551 Pain in right hip: Secondary | ICD-10-CM
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
## 2019-04-10 ENCOUNTER — Encounter: Payer: Self-pay | Admitting: Physical Therapy

## 2019-04-10 ENCOUNTER — Ambulatory Visit: Payer: Commercial Managed Care - PPO | Admitting: Physical Therapy

## 2019-04-10 ENCOUNTER — Other Ambulatory Visit: Payer: Self-pay

## 2019-04-10 DIAGNOSIS — M62830 Muscle spasm of back: Secondary | ICD-10-CM

## 2019-04-10 DIAGNOSIS — M542 Cervicalgia: Secondary | ICD-10-CM

## 2019-04-10 DIAGNOSIS — M25512 Pain in left shoulder: Secondary | ICD-10-CM

## 2019-04-10 DIAGNOSIS — M25551 Pain in right hip: Secondary | ICD-10-CM

## 2019-04-10 DIAGNOSIS — G8929 Other chronic pain: Secondary | ICD-10-CM

## 2019-04-10 DIAGNOSIS — M533 Sacrococcygeal disorders, not elsewhere classified: Secondary | ICD-10-CM
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
## 2019-04-15 ENCOUNTER — Other Ambulatory Visit: Payer: Self-pay

## 2019-04-15 ENCOUNTER — Encounter: Payer: Self-pay | Admitting: Physical Therapy

## 2019-04-15 ENCOUNTER — Ambulatory Visit: Payer: Commercial Managed Care - PPO | Admitting: Physical Therapy

## 2019-04-15 DIAGNOSIS — G8929 Other chronic pain: Secondary | ICD-10-CM

## 2019-04-15 DIAGNOSIS — M533 Sacrococcygeal disorders, not elsewhere classified: Secondary | ICD-10-CM | POA: Diagnosis not present

## 2019-04-15 DIAGNOSIS — M62830 Muscle spasm of back: Secondary | ICD-10-CM

## 2019-04-15 DIAGNOSIS — M25551 Pain in right hip: Secondary | ICD-10-CM

## 2019-04-15 DIAGNOSIS — M542 Cervicalgia: Secondary | ICD-10-CM

## 2019-04-15 DIAGNOSIS — M25512 Pain in left shoulder: Secondary | ICD-10-CM
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
## 2019-04-22 ENCOUNTER — Ambulatory Visit: Payer: Commercial Managed Care - PPO | Admitting: Physical Therapy

## 2019-04-22 ENCOUNTER — Other Ambulatory Visit: Payer: Self-pay

## 2019-04-22 ENCOUNTER — Encounter: Payer: Self-pay | Admitting: Physical Therapy

## 2019-04-22 DIAGNOSIS — M533 Sacrococcygeal disorders, not elsewhere classified: Secondary | ICD-10-CM

## 2019-04-22 DIAGNOSIS — M62830 Muscle spasm of back: Secondary | ICD-10-CM

## 2019-04-22 DIAGNOSIS — M25551 Pain in right hip: Secondary | ICD-10-CM
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
## 2019-04-24 ENCOUNTER — Encounter: Payer: Self-pay | Admitting: Physical Therapy

## 2019-04-24 ENCOUNTER — Ambulatory Visit: Payer: Commercial Managed Care - PPO | Admitting: Physical Therapy

## 2019-04-24 ENCOUNTER — Other Ambulatory Visit: Payer: Self-pay

## 2019-04-24 DIAGNOSIS — M542 Cervicalgia: Secondary | ICD-10-CM

## 2019-04-24 DIAGNOSIS — M62830 Muscle spasm of back: Secondary | ICD-10-CM

## 2019-04-24 DIAGNOSIS — G8929 Other chronic pain: Secondary | ICD-10-CM

## 2019-04-24 DIAGNOSIS — M533 Sacrococcygeal disorders, not elsewhere classified: Secondary | ICD-10-CM

## 2019-04-24 DIAGNOSIS — M25551 Pain in right hip: Secondary | ICD-10-CM

## 2019-04-24 DIAGNOSIS — M25512 Pain in left shoulder: Secondary | ICD-10-CM
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
## 2019-04-30 ENCOUNTER — Other Ambulatory Visit (INDEPENDENT_AMBULATORY_CARE_PROVIDER_SITE_OTHER): Payer: Commercial Managed Care - PPO

## 2019-04-30 ENCOUNTER — Ambulatory Visit (INDEPENDENT_AMBULATORY_CARE_PROVIDER_SITE_OTHER): Payer: Commercial Managed Care - PPO

## 2019-04-30 ENCOUNTER — Ambulatory Visit (INDEPENDENT_AMBULATORY_CARE_PROVIDER_SITE_OTHER): Payer: Commercial Managed Care - PPO | Admitting: Family Medicine

## 2019-04-30 ENCOUNTER — Encounter: Payer: Self-pay | Admitting: Family Medicine

## 2019-04-30 ENCOUNTER — Other Ambulatory Visit: Payer: Self-pay

## 2019-04-30 VITALS — BP 110/82 | HR 72 | Ht 69.0 in | Wt 174.0 lb

## 2019-04-30 DIAGNOSIS — M545 Low back pain, unspecified: Secondary | ICD-10-CM

## 2019-04-30 DIAGNOSIS — G8929 Other chronic pain: Secondary | ICD-10-CM

## 2019-04-30 DIAGNOSIS — M2550 Pain in unspecified joint: Secondary | ICD-10-CM | POA: Diagnosis not present

## 2019-04-30 DIAGNOSIS — M5441 Lumbago with sciatica, right side: Secondary | ICD-10-CM | POA: Diagnosis not present

## 2019-04-30 LAB — COMPREHENSIVE METABOLIC PANEL
ALT: 19 U/L (ref 0–35)
AST: 17 U/L (ref 0–37)
Albumin: 4.8 g/dL (ref 3.5–5.2)
Alkaline Phosphatase: 99 U/L (ref 39–117)
BUN: 10 mg/dL (ref 6–23)
CO2: 28 mEq/L (ref 19–32)
Calcium: 9.7 mg/dL (ref 8.4–10.5)
Chloride: 103 mEq/L (ref 96–112)
Creatinine, Ser: 0.68 mg/dL (ref 0.40–1.20)
GFR: 100.96 mL/min (ref >60.00)
Glucose, Bld: 95 mg/dL (ref 70–99)
Potassium: 4.4 mEq/L (ref 3.5–5.1)
Sodium: 138 mEq/L (ref 135–145)
Total Bilirubin: 0.5 mg/dL (ref 0.2–1.2)
Total Protein: 7.4 g/dL (ref 6.0–8.3)

## 2019-04-30 LAB — CBC WITH DIFFERENTIAL/PLATELET
Basophils Absolute: 0 10*3/uL (ref 0.0–0.1)
Basophils Relative: 0.8 % (ref 0.0–3.0)
Eosinophils Absolute: 0.4 10*3/uL (ref 0.0–0.7)
Eosinophils Relative: 6.7 % — ABNORMAL HIGH (ref 0.0–5.0)
HCT: 42.4 % (ref 36.0–46.0)
Hemoglobin: 14.1 g/dL (ref 12.0–15.0)
Lymphocytes Relative: 28 % (ref 12.0–46.0)
Lymphs Abs: 1.6 10*3/uL (ref 0.7–4.0)
MCHC: 33.2 g/dL (ref 30.0–36.0)
MCV: 86.3 fl (ref 78.0–100.0)
Monocytes Absolute: 0.5 10*3/uL (ref 0.1–1.0)
Monocytes Relative: 8.7 % (ref 3.0–12.0)
Neutro Abs: 3.2 10*3/uL (ref 1.4–7.7)
Neutrophils Relative %: 55.8 % (ref 43.0–77.0)
Platelets: 354 10*3/uL (ref 150.0–400.0)
RBC: 4.91 Mil/uL (ref 3.87–5.11)
RDW: 13.1 % (ref 11.5–15.5)
WBC: 5.7 10*3/uL (ref 4.0–10.5)

## 2019-04-30 LAB — TSH: TSH: 1.67 u[IU]/mL (ref 0.35–4.50)

## 2019-04-30 LAB — SEDIMENTATION RATE: Sed Rate: 18 mm/hr (ref 0–20)

## 2019-04-30 LAB — C-REACTIVE PROTEIN: CRP: <1 mg/dL (ref 0.5–20.0)

## 2019-04-30 LAB — IBC PANEL
Iron: 115 ug/dL (ref 42–145)
Saturation Ratios: 33.8 % (ref 20.0–50.0)
Transferrin: 243 mg/dL (ref 212.0–360.0)

## 2019-04-30 LAB — FERRITIN: Ferritin: 32.6 ng/mL (ref 10.0–291.0)

## 2019-04-30 LAB — URIC ACID: Uric Acid, Serum: 6.2 mg/dL (ref 2.4–7.0)

## 2019-04-30 MED ORDER — VITAMIN D (ERGOCALCIFEROL) 1.25 MG (50000 UNIT) PO CAPS: 50000.0000 [IU] | ORAL_CAPSULE | ORAL | 0 refills | Status: DC

## 2019-04-30 NOTE — Progress Notes (Signed)
[place] Phone: [phone] Subjective:   Regina [name], am serving as a scribe for Dr. Hulan Saas. This visit occurred during the SARS-CoV-2 public health emergency.  Safety protocols were in place, including screening questions prior to the visit, additional usage of staff PPE, and extensive cleaning of exam room while observing appropriate contact time as indicated for disinfecting solutions.   I'm seeing this patient by the request  of:    CC: Low back pain  RJJ:[id number]   03/27/2019 Patient is admitted in July seems to be multifactorial.  Seems to be some muscle imbalances.  Given home exercise, icing regimen, with activity which wants to avoid.  Discussed icing regimen, topical anti-inflammatories.  Patient did respond fairly well to her osteopath manipulation.  Follow-up again in 4 to 8 weeks  Update 04/30/2019 Regina [name] is a 29 y.o. female coming in with complaint of back pain. Patient does not feel any improvement. Pain with rotation L>R. Patient feels tightness as if her back is going to break. Has been going to PT and they are performing dry needling. Focusing more on the upper back musculature. Also having burning pain in left hip. Tightness seems to be radiating into the lower thoracic spine.  Failed physical therapy     [name] past medical history on file. Past Surgical History:  Procedure Laterality Date  . ANTERIOR CRUCIATE LIGAMENT REPAIR    . APPENDECTOMY    . WISDOM TOOTH EXTRACTION     Social History   Socioeconomic History  . Marital status: Married    Spouse name: Not on file  . Number of children: Not on file  . Years of education: Not on file  . Highest education level: Not on file  Occupational History  . Not on file  Tobacco Use  . Smoking status: Never Smoker  . Smokeless tobacco: Never Used  Substance and Sexual Activity  . Alcohol use: Not Currently  . Drug use:  [name]  . Sexual activity: Yes    Birth control/protection: Condom  Other Topics Concern  . Not on file  Social History Narrative  . Not on file   Social Determinants of Health   Financial Resource Strain:   . Difficulty of Paying Living Expenses: Not on file  Food Insecurity:   . Worried About Charity fundraiser in the Last Year: Not on file  . Ran Out of Food in the Last Year: Not on file  Transportation Needs:   . Lack of Transportation (Medical): Not on file  . Lack of Transportation (Non-Medical): Not on file  Physical Activity:   . Days of Exercise per Week: Not on file  . Minutes of Exercise per Session: Not on file  Stress:   . Feeling of Stress : Not on file  Social Connections:   . Frequency of Communication with Friends and Family: Not on file  . Frequency of Social Gatherings with Friends and Family: Not on file  . Attends Religious Services: Not on file  . Active Member of Clubs or Organizations: Not on file  . Attends Archivist Meetings: Not on file  . Marital Status: Not on file   Allergies  Allergen Reactions  . Codeine   . Hydromorphone     Other reaction(s): Vomiting  . Oxycodone     Other reaction(s): Vomiting  . Sulfa Antibiotics    Family History  Problem Relation Age of Onset  .  Heart attack Maternal Grandfather   . Healthy Mother   . Healthy Father   . Breast cancer Neg Hx   . Ovarian cancer Neg Hx   . Colon cancer Neg Hx   . Diabetes Neg Hx          Current Outpatient Medications (Other):  Marland Kitchen  LORazepam (ATIVAN) 0.5 MG tablet, Take 1 tablet (0.5 mg total) by mouth at bedtime as needed for anxiety. .  Prenatal Vit-Fe Fumarate-FA (MULTIVITAMIN-PRENATAL) 27-0.8 MG TABS tablet, Take 1 tablet by mouth daily at 12 noon. .  sertraline (ZOLOFT) 50 MG tablet, Take 1 tablet (50 mg total) by mouth daily. .  Vitamin D, Ergocalciferol, (DRISDOL) 1.25 MG (50000 UT) CAPS capsule, Take 1 capsule (50,000 Units total) by mouth every 7 (seven)  days. .  Vitamin D, Ergocalciferol, (DRISDOL) 1.25 MG (50000 UT) CAPS capsule, Take 1 capsule (50,000 Units total) by mouth every 7 (seven) days.    Past medical history, social, surgical and family history all reviewed in electronic medical record.  [name] pertanent information unless stated regarding to the chief complaint.   Review of Systems:  [name] headache, visual changes, nausea, vomiting, diarrhea, constipation, dizziness, abdominal pain, skin rash, fevers, chills, night sweats, weight loss, swollen lymph nodes, body aches, joint swelling, muscle aches, chest pain, shortness of breath, mood changes.   Objective  Blood pressure 110/82, pulse 72, height 5\' 9"  (1.753 m), weight 174 lb (78.9 kg), SpO2 98 %, currently breastfeeding.    General: [name] apparent distress alert and oriented x3 mood and affect normal, dressed appropriately.  HEENT: Pupils equal, extraocular movements intact  Respiratory: Patient's speak in full sentences and does not appear short of breath  Cardiovascular: [name] lower extremity edema, non tender, [name] erythema  Skin: Warm dry intact with [name] signs of infection or rash on extremities or on axial skeleton.  Abdomen: Soft nontender  Neuro: Cranial nerves II through XII are intact, neurovascularly intact in all extremities with 2+ DTRs and 2+ pulses.  Lymph: [name] lymphadenopathy of posterior or anterior cervical chain or axillae bilaterally.  Gait normal with good balance and coordination.  MSK:  tender with full range of motion and good stability and symmetric strength and tone of shoulders, elbows, wrist, hip, knee and ankles bilaterally.  Low back exam does have some loss of lordosis.  Severe tenderness to palpation of the right sacroiliac joint going up the paraspinal musculature of the lumbar spine.  Patient's pain still seems to be out of proportion to the amount of palpation.  Tender to palpation mild may be in the spinous process tenderness at the L4-L5 as well.    Impression and Recommendations:     This case required medical decision making of moderate complexity. The above documentation has been reviewed and is accurate and complete , DO       Note: This dictation was prepared with Dragon dictation along with smaller phrase technology. Any transcriptional errors that result from this process are unintentional.

## 2019-04-30 NOTE — Patient Instructions (Signed)
MRI at [facility] [phone] Xray today Labs today I will call you once we have MRI results and will go from there

## 2019-04-30 NOTE — Assessment & Plan Note (Signed)
Patient continues to have back pain that seems to be out of proportion.  Pain has been going on greater than 6 months now at this time.  Patient has failed conservative therapy including formal physical therapy and if anything worsening symptoms.  Even daily activities have become difficult and is waking her up at night.  No fevers chills or any abnormal weight loss though.  At this point I would like x-rays and then to further evaluate likely with an MRI to rule out any type of cystic change or congenital stenosis that could be contributing to some of the discomfort and pain.  Laboratory work-up also ordered today to further evaluate for any inflammatory process that could be contributing.

## 2019-05-01 ENCOUNTER — Encounter: Payer: Self-pay | Admitting: Physical Therapy

## 2019-05-01 ENCOUNTER — Ambulatory Visit: Payer: Commercial Managed Care - PPO | Attending: Certified Nurse Midwife | Admitting: Physical Therapy

## 2019-05-01 DIAGNOSIS — M542 Cervicalgia: Secondary | ICD-10-CM

## 2019-05-01 DIAGNOSIS — G8929 Other chronic pain: Secondary | ICD-10-CM | POA: Diagnosis present

## 2019-05-01 DIAGNOSIS — M25551 Pain in right hip: Secondary | ICD-10-CM | POA: Diagnosis present

## 2019-05-01 DIAGNOSIS — M62830 Muscle spasm of back: Secondary | ICD-10-CM | POA: Insufficient documentation

## 2019-05-01 DIAGNOSIS — M533 Sacrococcygeal disorders, not elsewhere classified: Secondary | ICD-10-CM | POA: Insufficient documentation

## 2019-05-01 DIAGNOSIS — M25512 Pain in left shoulder: Secondary | ICD-10-CM | POA: Diagnosis present

## 2019-05-01 LAB — VITAMIN D 25 HYDROXY (VIT D DEFICIENCY, FRACTURES): VITD: 35.03 ng/mL (ref 30.00–100.00)
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
## 2019-05-02 ENCOUNTER — Encounter: Payer: Self-pay | Admitting: Family Medicine

## 2019-05-02 LAB — LACTATE DEHYDROGENASE: LDH: 134 U/L (ref 100–200)

## 2019-05-02 LAB — CYCLIC CITRUL PEPTIDE ANTIBODY, IGG: Cyclic Citrullin Peptide Ab: <16 UNITS

## 2019-05-02 LAB — RHEUMATOID FACTOR: Rheumatoid fact SerPl-aCnc: <14 IU/mL (ref <14)

## 2019-05-02 LAB — PTH, INTACT AND CALCIUM
Calcium: 9.8 mg/dL (ref 8.6–10.2)
PTH: 13 pg/mL — ABNORMAL LOW (ref 14–64)

## 2019-05-02 LAB — CALCIUM, IONIZED: Calcium, Ion: 5.05 mg/dL (ref 4.8–5.6)

## 2019-05-02 LAB — ANA: Anti Nuclear Antibody (ANA): NEGATIVE

## 2019-05-02 LAB — ANGIOTENSIN CONVERTING ENZYME: Angiotensin-Converting Enzyme: 28 U/L (ref 9–67)

## 2019-05-07 ENCOUNTER — Ambulatory Visit: Payer: Commercial Managed Care - PPO | Admitting: Physical Therapy

## 2019-05-07 ENCOUNTER — Other Ambulatory Visit: Payer: Self-pay

## 2019-05-07 ENCOUNTER — Encounter: Payer: Self-pay | Admitting: Physical Therapy

## 2019-05-07 DIAGNOSIS — M62830 Muscle spasm of back: Secondary | ICD-10-CM

## 2019-05-07 DIAGNOSIS — M25551 Pain in right hip: Secondary | ICD-10-CM

## 2019-05-07 DIAGNOSIS — M533 Sacrococcygeal disorders, not elsewhere classified: Secondary | ICD-10-CM
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
## 2019-05-09 ENCOUNTER — Ambulatory Visit: Payer: Commercial Managed Care - PPO | Admitting: Physical Therapy

## 2019-05-09 ENCOUNTER — Other Ambulatory Visit: Payer: Self-pay

## 2019-05-09 ENCOUNTER — Encounter: Payer: Self-pay | Admitting: Physical Therapy

## 2019-05-09 DIAGNOSIS — G8929 Other chronic pain: Secondary | ICD-10-CM

## 2019-05-09 DIAGNOSIS — M25551 Pain in right hip: Secondary | ICD-10-CM

## 2019-05-09 DIAGNOSIS — M533 Sacrococcygeal disorders, not elsewhere classified: Secondary | ICD-10-CM

## 2019-05-09 DIAGNOSIS — M62830 Muscle spasm of back: Secondary | ICD-10-CM

## 2019-05-09 DIAGNOSIS — M25512 Pain in left shoulder: Secondary | ICD-10-CM

## 2019-05-09 DIAGNOSIS — M542 Cervicalgia: Secondary | ICD-10-CM
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
## 2019-05-13 ENCOUNTER — Ambulatory Visit: Payer: Commercial Managed Care - PPO | Admitting: Physical Therapy

## 2019-05-13 ENCOUNTER — Other Ambulatory Visit: Payer: Self-pay

## 2019-05-13 ENCOUNTER — Encounter: Payer: Self-pay | Admitting: Physical Therapy

## 2019-05-13 DIAGNOSIS — M25551 Pain in right hip: Secondary | ICD-10-CM

## 2019-05-13 DIAGNOSIS — M542 Cervicalgia: Secondary | ICD-10-CM

## 2019-05-13 DIAGNOSIS — M62830 Muscle spasm of back: Secondary | ICD-10-CM

## 2019-05-13 DIAGNOSIS — G8929 Other chronic pain: Secondary | ICD-10-CM

## 2019-05-13 DIAGNOSIS — M533 Sacrococcygeal disorders, not elsewhere classified: Secondary | ICD-10-CM | POA: Diagnosis not present
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
## 2019-05-15 ENCOUNTER — Other Ambulatory Visit: Payer: Self-pay | Admitting: Family Medicine

## 2019-05-15 ENCOUNTER — Ambulatory Visit: Payer: Commercial Managed Care - PPO | Admitting: Physical Therapy

## 2019-05-15 ENCOUNTER — Encounter: Payer: Self-pay | Admitting: Physical Therapy

## 2019-05-15 ENCOUNTER — Other Ambulatory Visit: Payer: Self-pay

## 2019-05-15 DIAGNOSIS — M542 Cervicalgia: Secondary | ICD-10-CM

## 2019-05-15 DIAGNOSIS — M533 Sacrococcygeal disorders, not elsewhere classified: Secondary | ICD-10-CM

## 2019-05-15 DIAGNOSIS — G8929 Other chronic pain: Secondary | ICD-10-CM

## 2019-05-15 DIAGNOSIS — M25551 Pain in right hip: Secondary | ICD-10-CM

## 2019-05-15 DIAGNOSIS — M62830 Muscle spasm of back: Secondary | ICD-10-CM
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
## 2019-05-16 ENCOUNTER — Ambulatory Visit
Admission: RE | Admit: 2019-05-16 | Discharge: 2019-05-16 | Disposition: A | Discharge status: Home / Self Care | Payer: Commercial Managed Care - PPO | Source: Ambulatory Visit | Attending: Family Medicine | Admitting: Family Medicine

## 2019-05-16 DIAGNOSIS — M545 Low back pain, unspecified: Secondary | ICD-10-CM

## 2019-05-16 IMAGING — MR MR LUMBAR SPINE W/O CM
4 of 5 series · 18 of 48 positions shown · non-contrast
Comparison: Radiography [DATE]

CLINICAL DATA: Right-sided low back pain, pelvic and hip pain.

EXAM:
MRI LUMBAR SPINE WITHOUT CONTRAST
TECHNIQUE: Multiplanar, multisequence MR imaging of the lumbar spine was
performed. No intravenous contrast was administered.

[Series 5: T2 · sagittal · 4.0mm · 0.73mm/px · 5 of 15 slices shown (1 of 2)]
[im 1/15]
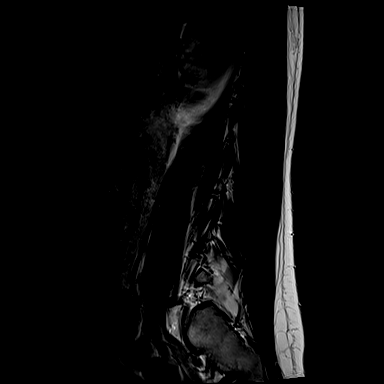
[im 4/15]
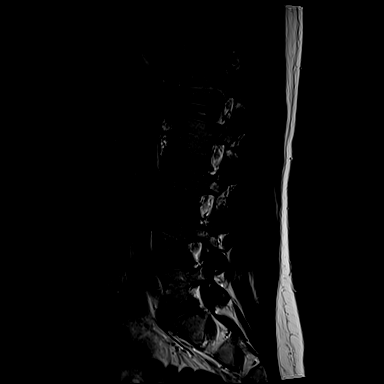
[im 8/15]
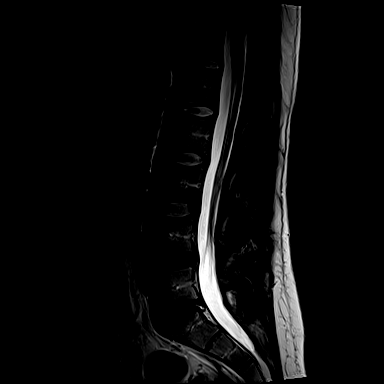
[im 11/15]
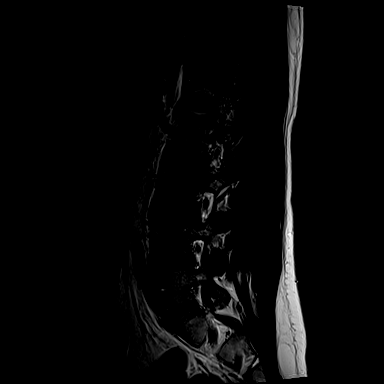
[im 15/15]
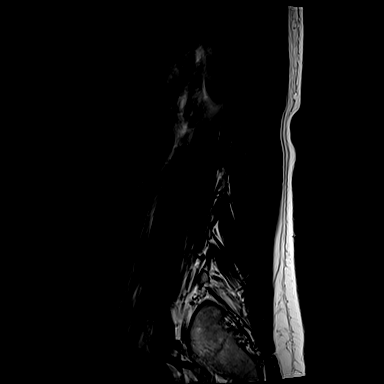

[Series 6: T1 · sagittal · 4.0mm · 0.73mm/px · 3 of 15 slices shown (1 of 2)]
[im 1/15]
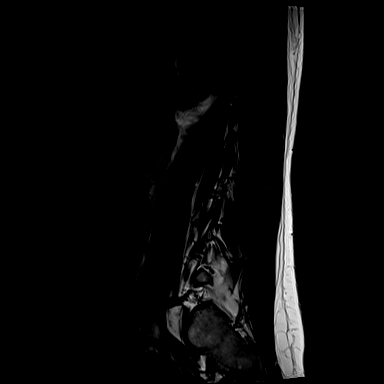
[im 8/15]
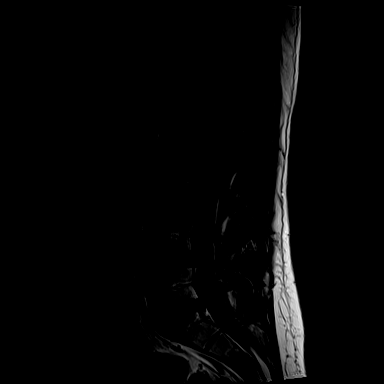
[im 15/15]
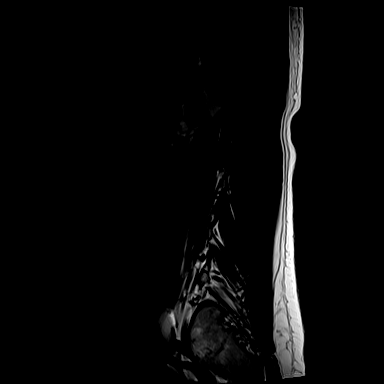

[Series 10: T1 · axial · 4.0mm · 0.28mm/px · z∈[-77,+87]mm · 3 of 42 slices shown (2 of 2)]
[im 6/42]
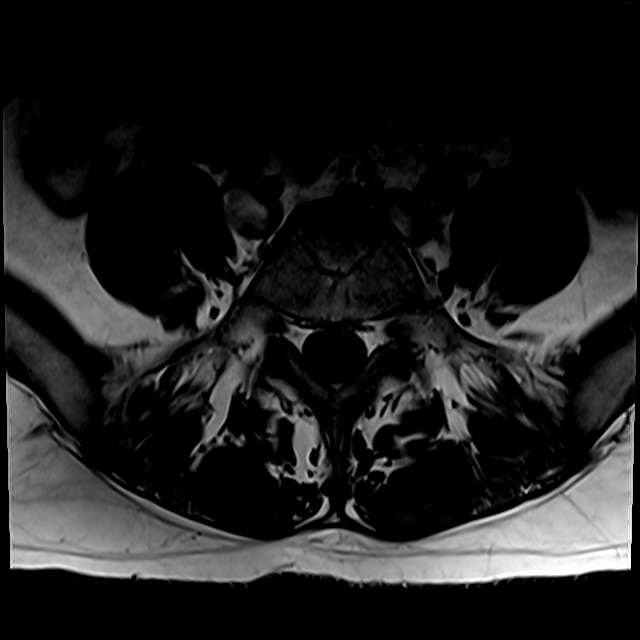
[im 22/42]
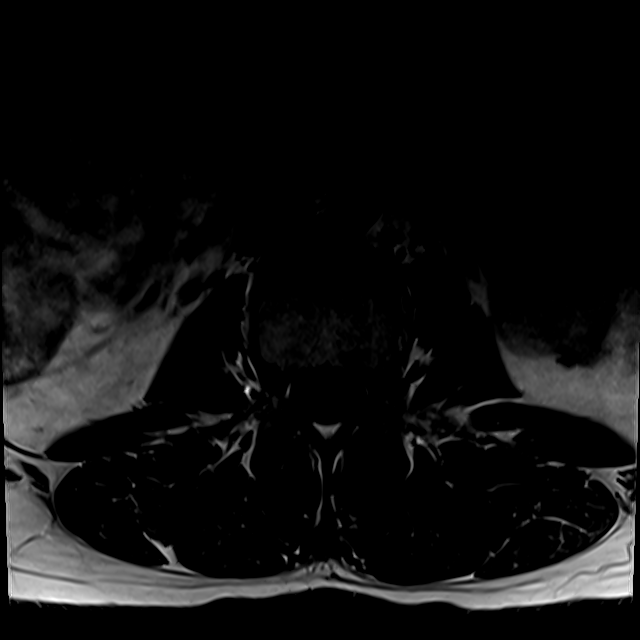
[im 36/42]
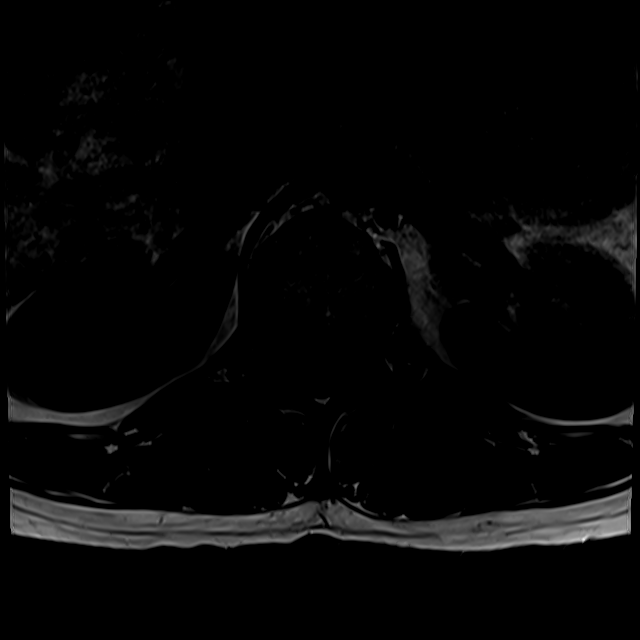

[Series 13: T2 · axial · 4.0mm · 0.28mm/px · z∈[-92,+87]mm · 7 of 42 slices shown (2 of 2)]
[im 3/42]
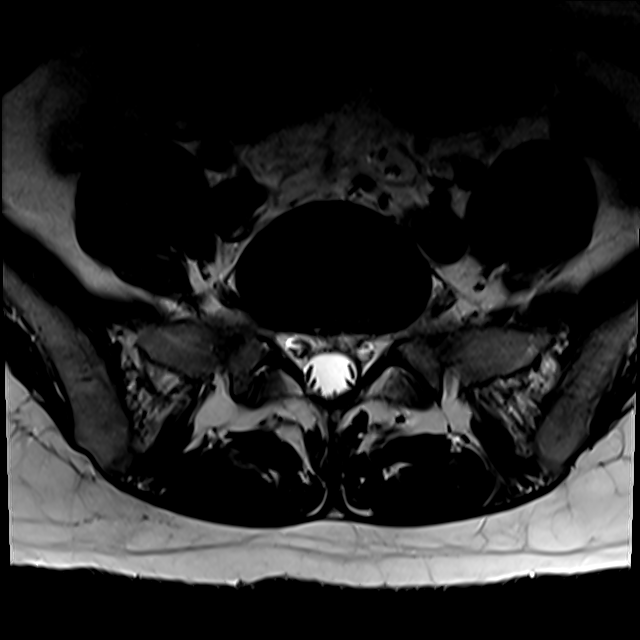
[im 6/42]
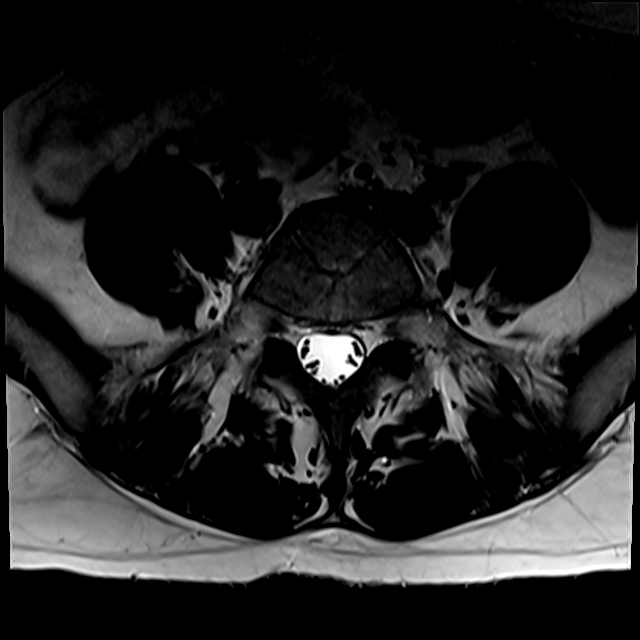
[im 9/42]
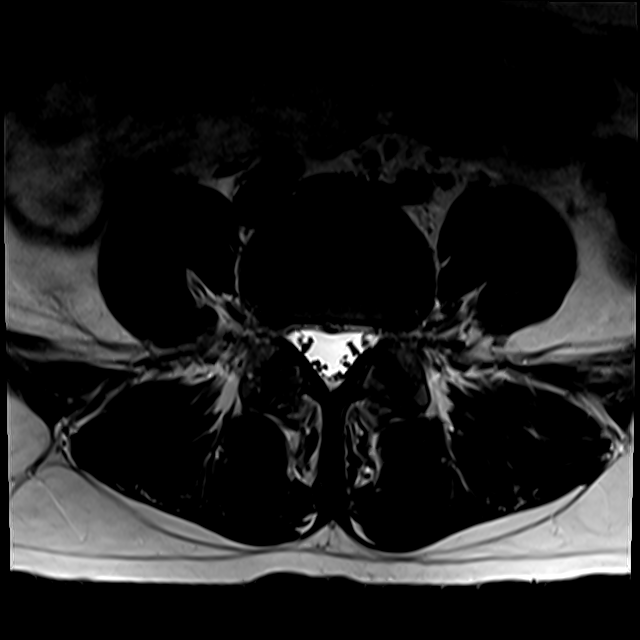
[im 14/42]
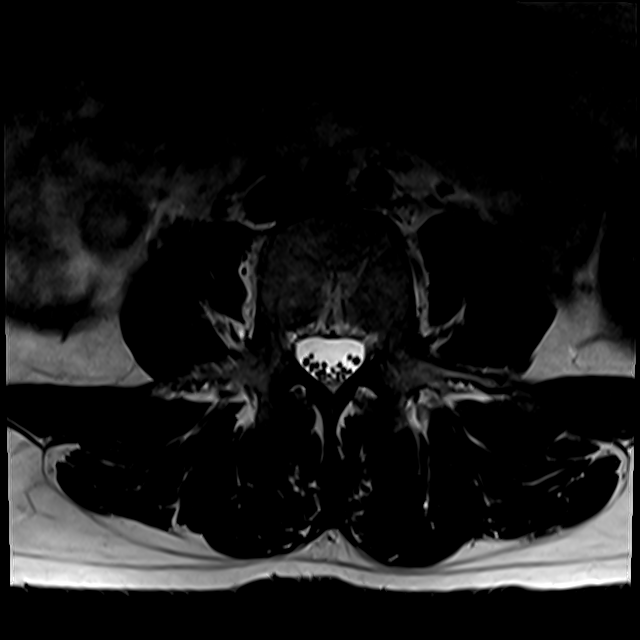
[im 20/42]
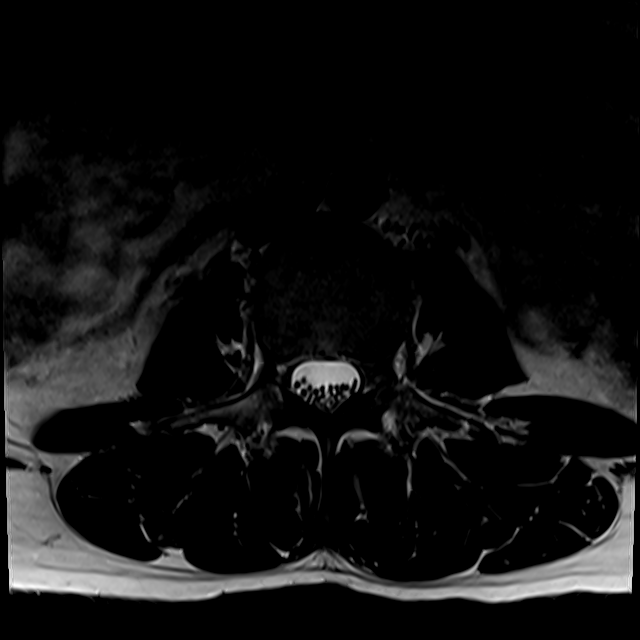
[im 22/42]
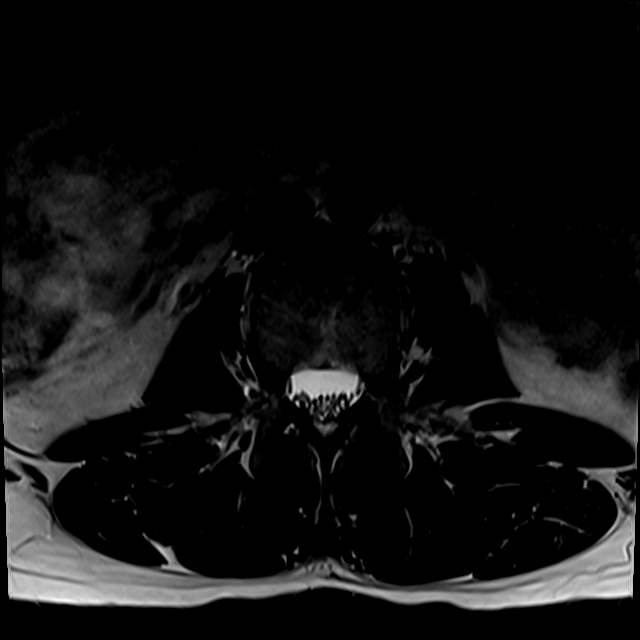
[im 36/42]
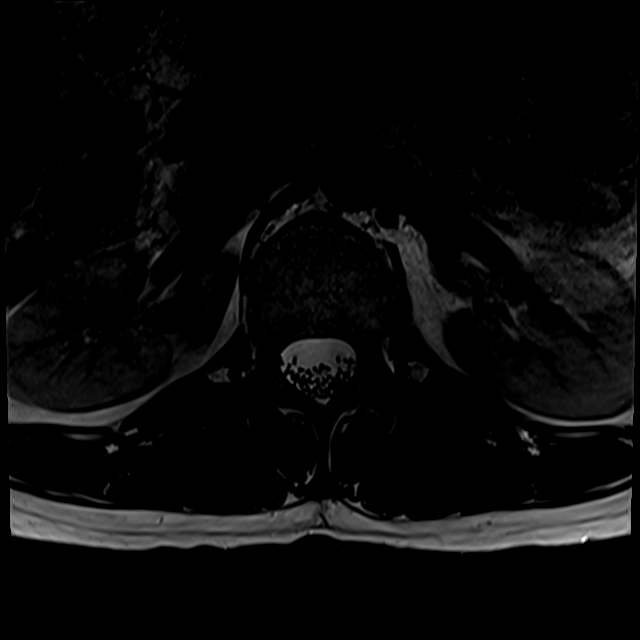

[18 of 48 positions shown; findings below may reference images not displayed]

FINDINGS: Segmentation:  5 lumbar type vertebral bodies.

Alignment:  Normal

Vertebrae:  Normal

Conus medullaris and cauda equina: Conus extends to the T12-L1
level. Conus and cauda equina appear normal.

Paraspinal and other soft tissues: Normal

Disc levels:

No abnormality at L3-4 or above.

L4-5: Mild desiccation of the disc with annular bulging and small
annular fissures. No compressive stenosis of the canal or foramina.

L5-S1: Disc degeneration with shallow disc herniation and slight
caudal down turning behind the superior endplate of L5. This is in
contact with the ventral thecal sac and S1 nerves but does not cause
any nerve compression or displacement. Nerve irritation could occur.
IMPRESSION: L4-5: Mild disc degeneration with annular bulging and annular
fissures. No apparent neural compression. The finding could be
associated with back pain or nerve irritation.

L5-S1: Disc degeneration with a shallow disc herniation with slight
caudal down turning behind the superior endplate of S1. This is
adjacent to the S1 nerves but does not cause nerve compression or
displacement. This could be associated with nerve irritation.

## 2019-05-19 ENCOUNTER — Encounter: Payer: Self-pay | Admitting: Physical Therapy

## 2019-05-19 ENCOUNTER — Other Ambulatory Visit: Payer: Self-pay

## 2019-05-19 ENCOUNTER — Ambulatory Visit: Payer: Commercial Managed Care - PPO | Admitting: Physical Therapy

## 2019-05-19 ENCOUNTER — Encounter: Payer: Self-pay | Admitting: Certified Nurse Midwife

## 2019-05-19 DIAGNOSIS — M62830 Muscle spasm of back: Secondary | ICD-10-CM

## 2019-05-19 DIAGNOSIS — M542 Cervicalgia: Secondary | ICD-10-CM

## 2019-05-19 DIAGNOSIS — M25551 Pain in right hip: Secondary | ICD-10-CM

## 2019-05-19 DIAGNOSIS — G8929 Other chronic pain: Secondary | ICD-10-CM

## 2019-05-19 DIAGNOSIS — M533 Sacrococcygeal disorders, not elsewhere classified: Secondary | ICD-10-CM

## 2019-05-19 DIAGNOSIS — M25512 Pain in left shoulder: Secondary | ICD-10-CM
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
## 2019-05-20 ENCOUNTER — Other Ambulatory Visit: Payer: Self-pay

## 2019-05-20 MED ORDER — DICLOXACILLIN SODIUM 500 MG PO CAPS: 500.0000 mg | ORAL_CAPSULE | Freq: Four times a day (QID) | ORAL | 0 refills | Status: DC

## 2019-05-22 ENCOUNTER — Other Ambulatory Visit: Payer: Self-pay

## 2019-05-22 ENCOUNTER — Ambulatory Visit: Payer: Commercial Managed Care - PPO | Admitting: Physical Therapy

## 2019-05-22 ENCOUNTER — Encounter: Payer: Self-pay | Admitting: Physical Therapy

## 2019-05-22 DIAGNOSIS — G8929 Other chronic pain: Secondary | ICD-10-CM

## 2019-05-22 DIAGNOSIS — M25551 Pain in right hip: Secondary | ICD-10-CM

## 2019-05-22 DIAGNOSIS — M533 Sacrococcygeal disorders, not elsewhere classified: Secondary | ICD-10-CM

## 2019-05-22 DIAGNOSIS — M62830 Muscle spasm of back: Secondary | ICD-10-CM
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
## 2019-05-26 ENCOUNTER — Encounter: Payer: Self-pay | Admitting: Physical Therapy

## 2019-05-26 ENCOUNTER — Ambulatory Visit: Payer: Commercial Managed Care - PPO | Attending: Certified Nurse Midwife | Admitting: Physical Therapy

## 2019-05-26 ENCOUNTER — Other Ambulatory Visit: Payer: Self-pay

## 2019-05-26 DIAGNOSIS — M533 Sacrococcygeal disorders, not elsewhere classified: Secondary | ICD-10-CM

## 2019-05-26 DIAGNOSIS — M25512 Pain in left shoulder: Secondary | ICD-10-CM | POA: Diagnosis present

## 2019-05-26 DIAGNOSIS — M25551 Pain in right hip: Secondary | ICD-10-CM | POA: Diagnosis present

## 2019-05-26 DIAGNOSIS — M62830 Muscle spasm of back: Secondary | ICD-10-CM | POA: Diagnosis present

## 2019-05-26 DIAGNOSIS — G8929 Other chronic pain: Secondary | ICD-10-CM

## 2019-05-26 DIAGNOSIS — M542 Cervicalgia: Secondary | ICD-10-CM | POA: Diagnosis present
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
## 2019-05-27 ENCOUNTER — Encounter: Payer: Self-pay | Admitting: Family Medicine

## 2019-05-29 ENCOUNTER — Encounter: Payer: Self-pay | Admitting: Physical Therapy

## 2019-05-29 ENCOUNTER — Ambulatory Visit: Payer: Commercial Managed Care - PPO | Admitting: Physical Therapy

## 2019-05-29 ENCOUNTER — Other Ambulatory Visit: Payer: Self-pay

## 2019-05-29 DIAGNOSIS — M62830 Muscle spasm of back: Secondary | ICD-10-CM

## 2019-05-29 DIAGNOSIS — M533 Sacrococcygeal disorders, not elsewhere classified: Secondary | ICD-10-CM | POA: Diagnosis not present

## 2019-05-29 DIAGNOSIS — M25551 Pain in right hip: Secondary | ICD-10-CM
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
## 2019-06-02 ENCOUNTER — Ambulatory Visit: Payer: Commercial Managed Care - PPO | Admitting: Physical Therapy

## 2019-06-04 ENCOUNTER — Encounter: Payer: Self-pay | Admitting: Physical Therapy

## 2019-06-04 ENCOUNTER — Ambulatory Visit: Payer: Commercial Managed Care - PPO | Admitting: Physical Therapy

## 2019-06-04 ENCOUNTER — Other Ambulatory Visit: Payer: Self-pay

## 2019-06-04 DIAGNOSIS — M25512 Pain in left shoulder: Secondary | ICD-10-CM

## 2019-06-04 DIAGNOSIS — M62830 Muscle spasm of back: Secondary | ICD-10-CM

## 2019-06-04 DIAGNOSIS — M542 Cervicalgia: Secondary | ICD-10-CM

## 2019-06-04 DIAGNOSIS — M533 Sacrococcygeal disorders, not elsewhere classified: Secondary | ICD-10-CM | POA: Diagnosis not present

## 2019-06-04 DIAGNOSIS — M25551 Pain in right hip: Secondary | ICD-10-CM

## 2019-06-04 DIAGNOSIS — G8929 Other chronic pain: Secondary | ICD-10-CM
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
## 2019-06-10 ENCOUNTER — Ambulatory Visit: Payer: Commercial Managed Care - PPO | Admitting: Physical Therapy

## 2019-06-10 ENCOUNTER — Encounter: Payer: Self-pay | Admitting: Physical Therapy

## 2019-06-10 ENCOUNTER — Other Ambulatory Visit: Payer: Self-pay

## 2019-06-10 DIAGNOSIS — M533 Sacrococcygeal disorders, not elsewhere classified: Secondary | ICD-10-CM

## 2019-06-10 DIAGNOSIS — M25551 Pain in right hip: Secondary | ICD-10-CM

## 2019-06-10 DIAGNOSIS — M62830 Muscle spasm of back: Secondary | ICD-10-CM

## 2019-06-10 DIAGNOSIS — G8929 Other chronic pain: Secondary | ICD-10-CM
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
## 2019-06-12 ENCOUNTER — Ambulatory Visit: Payer: Commercial Managed Care - PPO | Admitting: Physical Therapy

## 2019-06-13 ENCOUNTER — Ambulatory Visit (INDEPENDENT_AMBULATORY_CARE_PROVIDER_SITE_OTHER): Payer: Commercial Managed Care - PPO | Admitting: Certified Nurse Midwife

## 2019-06-13 ENCOUNTER — Other Ambulatory Visit: Payer: Self-pay

## 2019-06-13 ENCOUNTER — Other Ambulatory Visit (HOSPITAL_COMMUNITY)
Admission: RE | Admit: 2019-06-13 | Discharge: 2019-06-13 | Disposition: A | Discharge status: Home / Self Care | Payer: Commercial Managed Care - PPO | Source: Ambulatory Visit | Attending: Certified Nurse Midwife | Admitting: Certified Nurse Midwife

## 2019-06-13 ENCOUNTER — Encounter: Payer: Self-pay | Admitting: Certified Nurse Midwife

## 2019-06-13 VITALS — BP 115/86 | HR 76 | Ht 69.0 in | Wt 172.0 lb

## 2019-06-13 DIAGNOSIS — F419 Anxiety disorder, unspecified: Secondary | ICD-10-CM

## 2019-06-13 DIAGNOSIS — Z01419 Encounter for gynecological examination (general) (routine) without abnormal findings: Secondary | ICD-10-CM | POA: Insufficient documentation

## 2019-06-13 DIAGNOSIS — F329 Major depressive disorder, single episode, unspecified: Secondary | ICD-10-CM | POA: Diagnosis not present

## 2019-06-13 DIAGNOSIS — Z124 Encounter for screening for malignant neoplasm of cervix: Secondary | ICD-10-CM | POA: Insufficient documentation

## 2019-06-13 DIAGNOSIS — F32A Depression, unspecified: Secondary | ICD-10-CM

## 2019-06-13 NOTE — Progress Notes (Signed)
Patient here for annual exam.  No complaints.  Declines flu shot.

## 2019-06-13 NOTE — Progress Notes (Signed)
ANNUAL PREVENTATIVE CARE GYN  ENCOUNTER NOTE  Subjective:       Regina Gray is a 29 y.o. G61P1011 female here for a routine annual gynecologic exam.  Current complaints: 1. Needs Pap smear  Taking postnatal vitamin while breastfeeding. Home with son. Spouse working from home. Mother moved down the street.   Going to Physical Therapy for neck and back pain after referral to Dr. Katrinka Blazing.   Declines flu vaccine.   Denies difficulty breathing or respiratory distress, chest pain, abdominal pain, excessive vaginal bleeding, dysuria, and leg pain or swelling.    Gynecologic History  No LMP recorded (lmp unknown). (Menstrual status: Lactating).  Contraception: condoms  Last Pap: 11/20/2016. 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  Past Surgical History:  Procedure Laterality Date  . ANTERIOR CRUCIATE LIGAMENT REPAIR    . APPENDECTOMY    . WISDOM TOOTH EXTRACTION      Current Outpatient Medications on File Prior to Visit  Medication Sig Dispense Refill  . LORazepam (ATIVAN) 0.5 MG tablet Take 1 tablet (0.5 mg total) by mouth at bedtime as needed for anxiety. 30 tablet 0  . Prenatal Vit-Fe Fumarate-FA (MULTIVITAMIN-PRENATAL) 27-0.8 MG TABS tablet Take 1 tablet by mouth daily at 12 noon.    . sertraline (ZOLOFT) 50 MG tablet Take 1 tablet (50 mg total) by mouth daily. 30 tablet 6  . Vitamin D, Ergocalciferol, (DRISDOL) 1.25 MG (50000 UT) CAPS capsule Take 1 capsule (50,000 Units total) by mouth every 7 (seven) days. 12 capsule 0   No current facility-administered medications on file prior  to visit.    Allergies  Allergen Reactions  . Codeine   . Hydromorphone     Other reaction(s): Vomiting  . Oxycodone     Other reaction(s): Vomiting  . Sulfa Antibiotics     Social History   Socioeconomic History  . Marital status: Married    Spouse name: Not on file  . Number of children: Not on file  . Years of education: Not on file  . Highest education level: Not on file  Occupational History  . Not on file  Tobacco Use  . Smoking status: Never Smoker  . Smokeless tobacco: Never Used  Substance and Sexual Activity  . Alcohol use: Not Currently  . Drug use: No  . Sexual activity: Yes    Birth control/protection: Condom  Other Topics Concern  . Not on file  Social History Narrative  . Not on file   Social Determinants of Health   Financial Resource Strain:   . Difficulty of Paying Living Expenses: Not on file  Food Insecurity:   . Worried About [redacted]w[redacted]d in the Last Year: Not on file  . Ran Out of Food in the Last Year: Not on file  Transportation Needs:   . Lack of Transportation (Medical): Not on file  . Lack of Transportation (Non-Medical): Not on  file  Physical Activity:   . Days of Exercise per Week: Not on file  . Minutes of Exercise per Session: Not on file  Stress:   . Feeling of Stress : Not on file  Social Connections:   . Frequency of Communication with Friends and Family: Not on file  . Frequency of Social Gatherings with Friends and Family: Not on file  . Attends Religious Services: Not on file  . Active Member of Clubs or Organizations: Not on file  . Attends Archivist Meetings: Not on file  . Marital Status: Not on file  Intimate Partner Violence:   . Fear of Current or Ex-Partner: Not on file  . Emotionally Abused: Not on file  . Physically Abused: Not on file  . Sexually Abused: Not on file    Family History  Problem Relation Age of Onset  . Heart attack Maternal Grandfather   . Healthy Mother   . Healthy  Father   . Breast cancer Neg Hx   . Ovarian cancer Neg Hx   . Colon cancer Neg Hx   . Diabetes Neg Hx     The following portions of the patient's history were reviewed and updated as appropriate: allergies, current medications, past family history, past medical history, past social history, past surgical history and problem list.  Review of Systems  ROS negative except as noted above. Information obtained from patient.    Objective:   BP 115/86   Pulse 76   Ht 5\' 9"  (1.753 m)   Wt 172 lb (78 kg)   LMP  (LMP Unknown)   Breastfeeding Yes   BMI 25.40 kg/m    CONSTITUTIONAL: Well-developed, well-nourished female in no acute distress.   PSYCHIATRIC: Normal mood and affect. Normal behavior. Normal judgment and thought content.  [place]: Alert and oriented to person, place, and time. Normal muscle tone coordination. No cranial nerve deficit noted.  HENT:  Normocephalic, atraumatic, External right and left ear normal.  EYES: Conjunctivae and EOM are normal. Pupils are equal and round.    NECK: Normal range of motion, supple, no masses.  Normal thyroid.   SKIN: Skin is warm and dry. No rash noted. Not diaphoretic. No erythema. No pallor.  CARDIOVASCULAR: Normal heart rate noted, regular rhythm, no murmur.  RESPIRATORY: Clear to auscultation bilaterally. Effort and breath sounds normal, no problems with respiration noted.  BREASTS: Symmetric in size. No masses, skin changes, nipple drainage, or lymphadenopathy. Lactating.   ABDOMEN: Soft, normal bowel sounds, no distention noted.  No tenderness, rebound or guarding.   PELVIC:  External Genitalia: Normal  Vagina: Normal  Cervix: Normal, Pap collected  Uterus: Normal  Adnexa: Normal  RV: External hemorrhoid present    MUSCULOSKELETAL: Normal range of motion. No tenderness.  No cyanosis, clubbing, or edema.  2+ distal pulses.  LYMPHATIC: No Axillary, Supraclavicular, or Inguinal Adenopathy.  Depression screen [facility] 2/9  06/13/2019 03/03/2019 11/11/2018 10/11/2018 07/10/2018  Decreased Interest 0 0 0 1 1  Down, Depressed, Hopeless 0 0 1 1 0  PHQ - 2 Score 0 0 1 2 1   Altered sleeping 0 1 0 0 1  Tired, decreased energy 1 1 1 3 1   Change in appetite 0 0 0 0 0  Feeling bad or failure about yourself  0 0 1 1 0  Trouble concentrating 0 0 0 1 0  Moving slowly or fidgety/restless 0 0 0 0 0  Suicidal thoughts 0 0 0 0 0  PHQ-9 Score 1 2  3 7 3   Difficult doing work/chores Not difficult at all Not difficult at all Not difficult at all Not difficult at all Somewhat difficult  Some recent data might be hidden   GAD 7 : Generalized Anxiety Score 06/13/2019 03/03/2019 11/11/2018 10/11/2018  Nervous, Anxious, on Edge 1 1 1 3   Control/stop worrying 0 3 1 1   Worry too much - different things 1 0 0 1  Trouble relaxing 0 1 0 3  Restless 0 0 0 0  Easily annoyed or irritable 1 0 0 0  Afraid - awful might happen 0 3 2 1   Total GAD 7 Score 3 8 4 9   Anxiety Difficulty Not difficult at all Not difficult at all Not difficult at all Not difficult at all   Assessment:   Annual gynecologic examination 29 y.o.   Contraception: condoms   Overweight   Problem List Items Addressed This Visit    None    Visit Diagnoses    Well woman exam    -  Primary   Relevant Orders   Cytology - PAP   Screening for cervical cancer       Relevant Orders   Cytology - PAP      Plan:   Pap: Pap Co Test  Labs: Declined  Routine preventative health maintenance measures emphasized: Exercise/Diet/Weight control, Tobacco Warnings, Alcohol/Substance use risks and Stress Management; see AVS  Reviewed red flag symptoms and when to call  RTC x 1 year for ANNUAL EXAM or sooner if needed   , CNM [facility], [facility] 06/13/19 10:25 AM

## 2019-06-13 NOTE — Patient Instructions (Signed)
Preventive Care 21-31 Years Old, Female Preventive care refers to visits with your health care provider and lifestyle choices that can promote health and wellness. This includes:  A yearly physical exam. This may also be called an annual well check.  Regular dental visits and eye exams.  Immunizations.  Screening for certain conditions.  Healthy lifestyle choices, such as eating a healthy diet, getting regular exercise, not using drugs or products that contain nicotine and tobacco, and limiting alcohol use. What can I expect for my preventive care visit? Physical exam Your health care provider will check your:  Height and weight. This may be used to calculate body mass index (BMI), which tells if you are at a healthy weight.  Heart rate and blood pressure.  Skin for abnormal spots. Counseling Your health care provider may ask you questions about your:  Alcohol, tobacco, and drug use.  Emotional well-being.  Home and relationship well-being.  Sexual activity.  Eating habits.  Work and work environment.  Method of birth control.  Menstrual cycle.  Pregnancy history. What immunizations do I need?  Influenza (flu) vaccine  This is recommended every year. Tetanus, diphtheria, and pertussis (Tdap) vaccine  You may need a Td booster every 10 years. Varicella (chickenpox) vaccine  You may need this if you have not been vaccinated. Human papillomavirus (HPV) vaccine  If recommended by your health care provider, you may need three doses over 6 months. Measles, mumps, and rubella (MMR) vaccine  You may need at least one dose of MMR. You may also need a second dose. Meningococcal conjugate (MenACWY) vaccine  One dose is recommended if you are age 19-21 years and a first-year college student living in a residence hall, or if you have one of several medical conditions. You may also need additional booster doses. Pneumococcal conjugate (PCV13) vaccine  You may need  this if you have certain conditions and were not previously vaccinated. Pneumococcal polysaccharide (PPSV23) vaccine  You may need one or two doses if you smoke cigarettes or if you have certain conditions. Hepatitis A vaccine  You may need this if you have certain conditions or if you travel or work in places where you may be exposed to hepatitis A. Hepatitis B vaccine  You may need this if you have certain conditions or if you travel or work in places where you may be exposed to hepatitis B. Haemophilus influenzae type b (Hib) vaccine  You may need this if you have certain conditions. You may receive vaccines as individual doses or as more than one vaccine together in one shot (combination vaccines). Talk with your health care provider about the risks and benefits of combination vaccines. What tests do I need?  Blood tests  Lipid and cholesterol levels. These may be checked every 5 years starting at age 20.  Hepatitis C test.  Hepatitis B test. Screening  Diabetes screening. This is done by checking your blood sugar (glucose) after you have not eaten for a while (fasting).  Sexually transmitted disease (STD) testing.  BRCA-related cancer screening. This may be done if you have a family history of breast, ovarian, tubal, or peritoneal cancers.  Pelvic exam and Pap test. This may be done every 3 years starting at age 21. Starting at age 30, this may be done every 5 years if you have a Pap test in combination with an HPV test. Talk with your health care provider about your test results, treatment options, and if necessary, the need for more tests.   Follow these instructions at home: Eating and drinking   Eat a diet that includes fresh fruits and vegetables, whole grains, lean protein, and low-fat dairy.  Take vitamin and mineral supplements as recommended by your health care provider.  Do not drink alcohol if: ? Your health care provider tells you not to drink. ? You are  pregnant, may be pregnant, or are planning to become pregnant.  If you drink alcohol: ? Limit how much you have to 0-1 drink a day. ? Be aware of how much alcohol is in your drink. In the [place] one drink equals one 12 oz bottle of beer (355 mL), one 5 oz glass of wine (148 mL), or one 1 oz glass of hard liquor (44 mL). Lifestyle  Take daily care of your teeth and gums.  Stay active. Exercise for at least 30 minutes on 5 or more days each week.  Do not use any products that contain nicotine or tobacco, such as cigarettes, e-cigarettes, and chewing tobacco. If you need help quitting, ask your health care provider.  If you are sexually active, practice safe sex. Use a condom or other form of birth control (contraception) in order to prevent pregnancy and STIs (sexually transmitted infections). If you plan to become pregnant, see your health care provider for a preconception visit. What's next?  Visit your health care provider once a year for a well check visit.  Ask your health care provider how often you should have your eyes and teeth checked.  Stay up to date on all vaccines. This information is not intended to replace advice given to you by your health care provider. Make sure you discuss any questions you have with your health care provider. Document Revised: 12/20/2017 Document Reviewed: 12/20/2017 Elsevier Patient Education  2020 [employer].

## 2019-06-17 ENCOUNTER — Ambulatory Visit: Payer: Commercial Managed Care - PPO | Admitting: Physical Therapy

## 2019-06-17 ENCOUNTER — Encounter: Payer: Self-pay | Admitting: Physical Therapy

## 2019-06-17 ENCOUNTER — Other Ambulatory Visit: Payer: Self-pay

## 2019-06-17 DIAGNOSIS — M533 Sacrococcygeal disorders, not elsewhere classified: Secondary | ICD-10-CM | POA: Diagnosis not present

## 2019-06-17 DIAGNOSIS — M62830 Muscle spasm of back: Secondary | ICD-10-CM

## 2019-06-17 DIAGNOSIS — M25551 Pain in right hip: Secondary | ICD-10-CM
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
## 2019-06-18 LAB — CYTOLOGY - PAP
Comment: NEGATIVE
Diagnosis: NEGATIVE
High risk HPV: NEGATIVE

## 2019-06-19 ENCOUNTER — Other Ambulatory Visit: Payer: Self-pay

## 2019-06-19 ENCOUNTER — Ambulatory Visit: Payer: Commercial Managed Care - PPO | Admitting: Physical Therapy

## 2019-06-19 ENCOUNTER — Encounter: Payer: Self-pay | Admitting: Physical Therapy

## 2019-06-19 DIAGNOSIS — M533 Sacrococcygeal disorders, not elsewhere classified: Secondary | ICD-10-CM

## 2019-06-19 DIAGNOSIS — M25512 Pain in left shoulder: Secondary | ICD-10-CM

## 2019-06-19 DIAGNOSIS — M542 Cervicalgia: Secondary | ICD-10-CM

## 2019-06-19 DIAGNOSIS — M62830 Muscle spasm of back: Secondary | ICD-10-CM

## 2019-06-19 DIAGNOSIS — G8929 Other chronic pain: Secondary | ICD-10-CM

## 2019-06-19 DIAGNOSIS — M25551 Pain in right hip: Secondary | ICD-10-CM
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
## 2019-06-24 ENCOUNTER — Other Ambulatory Visit: Payer: Self-pay

## 2019-06-24 ENCOUNTER — Encounter: Payer: Self-pay | Admitting: Physical Therapy

## 2019-06-24 ENCOUNTER — Ambulatory Visit: Payer: Commercial Managed Care - PPO | Attending: Certified Nurse Midwife | Admitting: Physical Therapy

## 2019-06-24 DIAGNOSIS — M25512 Pain in left shoulder: Secondary | ICD-10-CM | POA: Diagnosis present

## 2019-06-24 DIAGNOSIS — M542 Cervicalgia: Secondary | ICD-10-CM | POA: Diagnosis present

## 2019-06-24 DIAGNOSIS — M25551 Pain in right hip: Secondary | ICD-10-CM | POA: Diagnosis present

## 2019-06-24 DIAGNOSIS — G8929 Other chronic pain: Secondary | ICD-10-CM | POA: Diagnosis present

## 2019-06-24 DIAGNOSIS — M533 Sacrococcygeal disorders, not elsewhere classified: Secondary | ICD-10-CM | POA: Diagnosis not present

## 2019-06-24 DIAGNOSIS — M62830 Muscle spasm of back: Secondary | ICD-10-CM | POA: Diagnosis present
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
## 2019-06-27 ENCOUNTER — Encounter: Payer: Self-pay | Admitting: Physical Therapy

## 2019-06-27 ENCOUNTER — Other Ambulatory Visit: Payer: Self-pay

## 2019-06-27 ENCOUNTER — Ambulatory Visit: Payer: Commercial Managed Care - PPO | Admitting: Physical Therapy

## 2019-06-27 DIAGNOSIS — M25551 Pain in right hip: Secondary | ICD-10-CM

## 2019-06-27 DIAGNOSIS — M533 Sacrococcygeal disorders, not elsewhere classified: Secondary | ICD-10-CM

## 2019-06-27 DIAGNOSIS — G8929 Other chronic pain: Secondary | ICD-10-CM

## 2019-06-27 DIAGNOSIS — M62830 Muscle spasm of back: Secondary | ICD-10-CM

## 2019-06-27 DIAGNOSIS — M542 Cervicalgia: Secondary | ICD-10-CM
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
## 2019-06-30 ENCOUNTER — Encounter: Payer: Commercial Managed Care - PPO | Admitting: Physical Therapy

## 2019-07-02 ENCOUNTER — Encounter: Payer: Commercial Managed Care - PPO | Admitting: Physical Therapy

## 2019-07-03 ENCOUNTER — Ambulatory Visit: Payer: Commercial Managed Care - PPO | Admitting: Physical Therapy

## 2019-07-04 ENCOUNTER — Encounter: Payer: Self-pay | Admitting: Physical Therapy

## 2019-07-04 ENCOUNTER — Other Ambulatory Visit: Payer: Self-pay

## 2019-07-04 ENCOUNTER — Ambulatory Visit: Payer: Commercial Managed Care - PPO | Admitting: Physical Therapy

## 2019-07-04 DIAGNOSIS — M62830 Muscle spasm of back: Secondary | ICD-10-CM

## 2019-07-04 DIAGNOSIS — M25512 Pain in left shoulder: Secondary | ICD-10-CM

## 2019-07-04 DIAGNOSIS — M533 Sacrococcygeal disorders, not elsewhere classified: Secondary | ICD-10-CM | POA: Diagnosis not present

## 2019-07-04 DIAGNOSIS — M25551 Pain in right hip: Secondary | ICD-10-CM

## 2019-07-04 DIAGNOSIS — G8929 Other chronic pain: Secondary | ICD-10-CM

## 2019-07-04 DIAGNOSIS — M542 Cervicalgia: Secondary | ICD-10-CM
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
## 2019-07-07 ENCOUNTER — Encounter: Payer: Commercial Managed Care - PPO | Admitting: Physical Therapy

## 2019-07-08 ENCOUNTER — Other Ambulatory Visit: Payer: Self-pay

## 2019-07-08 ENCOUNTER — Ambulatory Visit: Payer: Commercial Managed Care - PPO | Admitting: Physical Therapy

## 2019-07-08 ENCOUNTER — Encounter: Payer: Self-pay | Admitting: Physical Therapy

## 2019-07-08 DIAGNOSIS — M62830 Muscle spasm of back: Secondary | ICD-10-CM

## 2019-07-08 DIAGNOSIS — M542 Cervicalgia: Secondary | ICD-10-CM

## 2019-07-08 DIAGNOSIS — M25512 Pain in left shoulder: Secondary | ICD-10-CM

## 2019-07-08 DIAGNOSIS — M533 Sacrococcygeal disorders, not elsewhere classified: Secondary | ICD-10-CM

## 2019-07-08 DIAGNOSIS — G8929 Other chronic pain: Secondary | ICD-10-CM

## 2019-07-08 DIAGNOSIS — M25551 Pain in right hip: Secondary | ICD-10-CM
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
## 2019-07-10 ENCOUNTER — Encounter: Payer: Commercial Managed Care - PPO | Admitting: Physical Therapy

## 2019-07-11 ENCOUNTER — Other Ambulatory Visit: Payer: Self-pay

## 2019-07-11 ENCOUNTER — Ambulatory Visit: Payer: Commercial Managed Care - PPO | Admitting: Physical Therapy

## 2019-07-11 ENCOUNTER — Encounter: Payer: Self-pay | Admitting: Physical Therapy

## 2019-07-11 DIAGNOSIS — M533 Sacrococcygeal disorders, not elsewhere classified: Secondary | ICD-10-CM

## 2019-07-11 DIAGNOSIS — M62830 Muscle spasm of back: Secondary | ICD-10-CM

## 2019-07-11 DIAGNOSIS — M25551 Pain in right hip: Secondary | ICD-10-CM
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
## 2019-07-14 ENCOUNTER — Encounter: Payer: Commercial Managed Care - PPO | Admitting: Physical Therapy

## 2019-07-15 ENCOUNTER — Other Ambulatory Visit: Payer: Self-pay

## 2019-07-15 ENCOUNTER — Ambulatory Visit: Payer: Commercial Managed Care - PPO | Admitting: Physical Therapy

## 2019-07-15 ENCOUNTER — Encounter: Payer: Self-pay | Admitting: Physical Therapy

## 2019-07-15 DIAGNOSIS — M62830 Muscle spasm of back: Secondary | ICD-10-CM

## 2019-07-15 DIAGNOSIS — M25551 Pain in right hip: Secondary | ICD-10-CM

## 2019-07-15 DIAGNOSIS — M533 Sacrococcygeal disorders, not elsewhere classified: Secondary | ICD-10-CM | POA: Diagnosis not present
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
## 2019-07-17 ENCOUNTER — Other Ambulatory Visit: Payer: Self-pay

## 2019-07-17 ENCOUNTER — Ambulatory Visit: Payer: Commercial Managed Care - PPO | Admitting: Physical Therapy

## 2019-07-17 ENCOUNTER — Encounter: Payer: Self-pay | Admitting: Physical Therapy

## 2019-07-17 ENCOUNTER — Encounter: Payer: Commercial Managed Care - PPO | Admitting: Physical Therapy

## 2019-07-17 DIAGNOSIS — M542 Cervicalgia: Secondary | ICD-10-CM

## 2019-07-17 DIAGNOSIS — M533 Sacrococcygeal disorders, not elsewhere classified: Secondary | ICD-10-CM | POA: Diagnosis not present

## 2019-07-17 DIAGNOSIS — G8929 Other chronic pain: Secondary | ICD-10-CM

## 2019-07-17 DIAGNOSIS — M25551 Pain in right hip: Secondary | ICD-10-CM

## 2019-07-17 DIAGNOSIS — M62830 Muscle spasm of back: Secondary | ICD-10-CM

## 2019-07-17 DIAGNOSIS — M25512 Pain in left shoulder: Secondary | ICD-10-CM
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
## 2019-07-21 ENCOUNTER — Encounter: Payer: Commercial Managed Care - PPO | Admitting: Physical Therapy

## 2019-07-22 ENCOUNTER — Other Ambulatory Visit: Payer: Self-pay

## 2019-07-22 ENCOUNTER — Encounter: Payer: Self-pay | Admitting: Physical Therapy

## 2019-07-22 ENCOUNTER — Ambulatory Visit: Payer: Commercial Managed Care - PPO | Admitting: Physical Therapy

## 2019-07-22 DIAGNOSIS — M25512 Pain in left shoulder: Secondary | ICD-10-CM

## 2019-07-22 DIAGNOSIS — M533 Sacrococcygeal disorders, not elsewhere classified: Secondary | ICD-10-CM | POA: Diagnosis not present

## 2019-07-22 DIAGNOSIS — M25551 Pain in right hip: Secondary | ICD-10-CM

## 2019-07-22 DIAGNOSIS — G8929 Other chronic pain: Secondary | ICD-10-CM

## 2019-07-22 DIAGNOSIS — M62830 Muscle spasm of back: Secondary | ICD-10-CM

## 2019-07-22 DIAGNOSIS — M542 Cervicalgia: Secondary | ICD-10-CM
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
## 2019-07-24 ENCOUNTER — Ambulatory Visit: Payer: Commercial Managed Care - PPO | Attending: Certified Nurse Midwife | Admitting: Physical Therapy

## 2019-07-24 ENCOUNTER — Encounter: Payer: Self-pay | Admitting: Physical Therapy

## 2019-07-24 ENCOUNTER — Other Ambulatory Visit: Payer: Self-pay

## 2019-07-24 DIAGNOSIS — G8929 Other chronic pain: Secondary | ICD-10-CM

## 2019-07-24 DIAGNOSIS — M533 Sacrococcygeal disorders, not elsewhere classified: Secondary | ICD-10-CM | POA: Insufficient documentation

## 2019-07-24 DIAGNOSIS — M542 Cervicalgia: Secondary | ICD-10-CM | POA: Diagnosis present

## 2019-07-24 DIAGNOSIS — M25551 Pain in right hip: Secondary | ICD-10-CM | POA: Insufficient documentation

## 2019-07-24 DIAGNOSIS — M25512 Pain in left shoulder: Secondary | ICD-10-CM | POA: Diagnosis present

## 2019-07-24 DIAGNOSIS — M62830 Muscle spasm of back: Secondary | ICD-10-CM
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
## 2019-07-30 ENCOUNTER — Other Ambulatory Visit: Payer: Self-pay

## 2019-07-30 ENCOUNTER — Ambulatory Visit: Payer: Commercial Managed Care - PPO | Admitting: Physical Therapy

## 2019-07-30 ENCOUNTER — Encounter: Payer: Self-pay | Admitting: Physical Therapy

## 2019-07-30 DIAGNOSIS — M25551 Pain in right hip: Secondary | ICD-10-CM

## 2019-07-30 DIAGNOSIS — M533 Sacrococcygeal disorders, not elsewhere classified: Secondary | ICD-10-CM

## 2019-07-30 DIAGNOSIS — M542 Cervicalgia: Secondary | ICD-10-CM

## 2019-07-30 DIAGNOSIS — M25512 Pain in left shoulder: Secondary | ICD-10-CM

## 2019-07-30 DIAGNOSIS — G8929 Other chronic pain: Secondary | ICD-10-CM

## 2019-07-30 DIAGNOSIS — M62830 Muscle spasm of back: Secondary | ICD-10-CM
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
## 2019-08-05 ENCOUNTER — Other Ambulatory Visit: Payer: Self-pay

## 2019-08-05 ENCOUNTER — Encounter: Payer: Self-pay | Admitting: Physical Therapy

## 2019-08-05 ENCOUNTER — Ambulatory Visit: Payer: Commercial Managed Care - PPO | Admitting: Physical Therapy

## 2019-08-05 DIAGNOSIS — M533 Sacrococcygeal disorders, not elsewhere classified: Secondary | ICD-10-CM | POA: Diagnosis not present

## 2019-08-05 DIAGNOSIS — M25551 Pain in right hip: Secondary | ICD-10-CM

## 2019-08-05 DIAGNOSIS — M62830 Muscle spasm of back: Secondary | ICD-10-CM

## 2019-08-05 DIAGNOSIS — G8929 Other chronic pain: Secondary | ICD-10-CM

## 2019-08-05 DIAGNOSIS — M542 Cervicalgia: Secondary | ICD-10-CM

## 2019-08-05 DIAGNOSIS — M25512 Pain in left shoulder: Secondary | ICD-10-CM
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
## 2019-08-07 ENCOUNTER — Ambulatory Visit: Payer: Commercial Managed Care - PPO | Admitting: Physical Therapy

## 2019-08-07 ENCOUNTER — Encounter: Payer: Self-pay | Admitting: Physical Therapy

## 2019-08-07 ENCOUNTER — Other Ambulatory Visit: Payer: Self-pay

## 2019-08-07 DIAGNOSIS — G8929 Other chronic pain: Secondary | ICD-10-CM

## 2019-08-07 DIAGNOSIS — M25512 Pain in left shoulder: Secondary | ICD-10-CM

## 2019-08-07 DIAGNOSIS — M62830 Muscle spasm of back: Secondary | ICD-10-CM

## 2019-08-07 DIAGNOSIS — M533 Sacrococcygeal disorders, not elsewhere classified: Secondary | ICD-10-CM

## 2019-08-07 DIAGNOSIS — M542 Cervicalgia: Secondary | ICD-10-CM

## 2019-08-07 DIAGNOSIS — M25551 Pain in right hip: Secondary | ICD-10-CM
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
## 2019-08-12 ENCOUNTER — Other Ambulatory Visit: Payer: Self-pay

## 2019-08-12 ENCOUNTER — Ambulatory Visit: Payer: Commercial Managed Care - PPO | Admitting: Physical Therapy

## 2019-08-12 ENCOUNTER — Encounter: Payer: Self-pay | Admitting: Physical Therapy

## 2019-08-12 DIAGNOSIS — M533 Sacrococcygeal disorders, not elsewhere classified: Secondary | ICD-10-CM

## 2019-08-12 DIAGNOSIS — M25551 Pain in right hip: Secondary | ICD-10-CM

## 2019-08-12 DIAGNOSIS — M62830 Muscle spasm of back: Secondary | ICD-10-CM

## 2019-08-12 DIAGNOSIS — M542 Cervicalgia: Secondary | ICD-10-CM

## 2019-08-12 DIAGNOSIS — M25512 Pain in left shoulder: Secondary | ICD-10-CM

## 2019-08-12 DIAGNOSIS — G8929 Other chronic pain: Secondary | ICD-10-CM
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
## 2019-08-14 ENCOUNTER — Other Ambulatory Visit: Payer: Self-pay

## 2019-08-14 ENCOUNTER — Ambulatory Visit: Payer: Commercial Managed Care - PPO | Admitting: Physical Therapy

## 2019-08-14 ENCOUNTER — Encounter: Payer: Self-pay | Admitting: Physical Therapy

## 2019-08-14 DIAGNOSIS — M533 Sacrococcygeal disorders, not elsewhere classified: Secondary | ICD-10-CM

## 2019-08-14 DIAGNOSIS — M62830 Muscle spasm of back: Secondary | ICD-10-CM

## 2019-08-14 DIAGNOSIS — M25512 Pain in left shoulder: Secondary | ICD-10-CM

## 2019-08-14 DIAGNOSIS — G8929 Other chronic pain: Secondary | ICD-10-CM

## 2019-08-14 DIAGNOSIS — M25551 Pain in right hip: Secondary | ICD-10-CM

## 2019-08-14 DIAGNOSIS — M542 Cervicalgia: Secondary | ICD-10-CM
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
## 2019-08-20 ENCOUNTER — Ambulatory Visit: Payer: Commercial Managed Care - PPO | Admitting: Physical Therapy

## 2019-08-20 ENCOUNTER — Other Ambulatory Visit: Payer: Self-pay

## 2019-08-20 ENCOUNTER — Encounter: Payer: Self-pay | Admitting: Physical Therapy

## 2019-08-20 DIAGNOSIS — M25551 Pain in right hip: Secondary | ICD-10-CM

## 2019-08-20 DIAGNOSIS — M533 Sacrococcygeal disorders, not elsewhere classified: Secondary | ICD-10-CM

## 2019-08-20 DIAGNOSIS — M62830 Muscle spasm of back: Secondary | ICD-10-CM
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
## 2019-08-26 ENCOUNTER — Other Ambulatory Visit: Payer: Self-pay

## 2019-08-26 ENCOUNTER — Encounter: Payer: Self-pay | Admitting: Physical Therapy

## 2019-08-26 ENCOUNTER — Ambulatory Visit: Payer: Commercial Managed Care - PPO | Attending: Certified Nurse Midwife | Admitting: Physical Therapy

## 2019-08-26 DIAGNOSIS — G8929 Other chronic pain: Secondary | ICD-10-CM | POA: Diagnosis present

## 2019-08-26 DIAGNOSIS — M533 Sacrococcygeal disorders, not elsewhere classified: Secondary | ICD-10-CM

## 2019-08-26 DIAGNOSIS — M62830 Muscle spasm of back: Secondary | ICD-10-CM | POA: Diagnosis present

## 2019-08-26 DIAGNOSIS — M25551 Pain in right hip: Secondary | ICD-10-CM

## 2019-08-26 DIAGNOSIS — M25512 Pain in left shoulder: Secondary | ICD-10-CM | POA: Diagnosis present

## 2019-08-26 DIAGNOSIS — M542 Cervicalgia: Secondary | ICD-10-CM
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
## 2019-09-03 ENCOUNTER — Ambulatory Visit: Payer: Commercial Managed Care - PPO | Admitting: Physical Therapy

## 2019-09-03 ENCOUNTER — Encounter: Payer: Self-pay | Admitting: Physical Therapy

## 2019-09-03 ENCOUNTER — Other Ambulatory Visit: Payer: Self-pay

## 2019-09-03 DIAGNOSIS — M62830 Muscle spasm of back: Secondary | ICD-10-CM

## 2019-09-03 DIAGNOSIS — M533 Sacrococcygeal disorders, not elsewhere classified: Secondary | ICD-10-CM | POA: Diagnosis not present

## 2019-09-03 DIAGNOSIS — G8929 Other chronic pain: Secondary | ICD-10-CM

## 2019-09-03 DIAGNOSIS — M542 Cervicalgia: Secondary | ICD-10-CM

## 2019-09-03 DIAGNOSIS — M25551 Pain in right hip: Secondary | ICD-10-CM
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
## 2019-09-09 ENCOUNTER — Other Ambulatory Visit: Payer: Self-pay

## 2019-09-09 ENCOUNTER — Ambulatory Visit: Payer: Commercial Managed Care - PPO | Admitting: Physical Therapy

## 2019-09-09 ENCOUNTER — Encounter: Payer: Self-pay | Admitting: Physical Therapy

## 2019-09-09 DIAGNOSIS — M62830 Muscle spasm of back: Secondary | ICD-10-CM

## 2019-09-09 DIAGNOSIS — M542 Cervicalgia: Secondary | ICD-10-CM

## 2019-09-09 DIAGNOSIS — M25551 Pain in right hip: Secondary | ICD-10-CM

## 2019-09-09 DIAGNOSIS — M25512 Pain in left shoulder: Secondary | ICD-10-CM

## 2019-09-09 DIAGNOSIS — M533 Sacrococcygeal disorders, not elsewhere classified: Secondary | ICD-10-CM | POA: Diagnosis not present

## 2019-09-09 NOTE — Addendum Note (Signed)
Addended by: Staci Acosta on: 09/09/2019 03:42 PM   Modules accepted: Orders
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
## 2019-12-18 ENCOUNTER — Other Ambulatory Visit (INDEPENDENT_AMBULATORY_CARE_PROVIDER_SITE_OTHER): Payer: Commercial Managed Care - PPO

## 2019-12-18 ENCOUNTER — Other Ambulatory Visit: Payer: Self-pay

## 2019-12-18 ENCOUNTER — Encounter: Payer: Self-pay | Admitting: Certified Nurse Midwife

## 2019-12-18 ENCOUNTER — Ambulatory Visit (INDEPENDENT_AMBULATORY_CARE_PROVIDER_SITE_OTHER): Payer: Commercial Managed Care - PPO | Admitting: Certified Nurse Midwife

## 2019-12-18 VITALS — BP 112/75 | HR 69 | Ht 69.0 in | Wt 172.9 lb

## 2019-12-18 DIAGNOSIS — E7212 Methylenetetrahydrofolate reductase deficiency: Secondary | ICD-10-CM | POA: Diagnosis not present

## 2019-12-18 DIAGNOSIS — Z1589 Genetic susceptibility to other disease: Secondary | ICD-10-CM | POA: Insufficient documentation

## 2019-12-18 DIAGNOSIS — N926 Irregular menstruation, unspecified: Secondary | ICD-10-CM

## 2019-12-18 DIAGNOSIS — Z3687 Encounter for antenatal screening for uncertain dates: Secondary | ICD-10-CM

## 2019-12-18 DIAGNOSIS — Z3A08 8 weeks gestation of pregnancy: Secondary | ICD-10-CM | POA: Diagnosis not present

## 2019-12-18 LAB — POCT URINE PREGNANCY: Preg Test, Ur: POSITIVE — AB

## 2019-12-18 MED ORDER — ASPIRIN EC 81 MG PO TBEC: 81.0000 mg | DELAYED_RELEASE_TABLET | Freq: Every day | ORAL | 2 refills | Status: DC

## 2019-12-18 NOTE — Patient Instructions (Signed)
First Trimester of Pregnancy  The first trimester of pregnancy is from week 1 until the end of week 13 (months 1 through 3). During this time, your baby will begin to develop inside you. At 6-8 weeks, the eyes and face are formed, and the heartbeat can be seen on ultrasound. At the end of 12 weeks, all the baby's organs are formed. Prenatal care is all the medical care you receive before the birth of your baby. Make sure you get good prenatal care and follow all of your doctor's instructions. Follow these instructions at home: Medicines  Take over-the-counter and prescription medicines only as told by your doctor. Some medicines are safe and some medicines are not safe during pregnancy.  Take a prenatal vitamin that contains at least 600 micrograms (mcg) of folic acid.  If you have trouble pooping (constipation), take medicine that will make your stool soft (stool softener) if your doctor approves. Eating and drinking   Eat regular, healthy meals.  Your doctor will tell you the amount of weight gain that is right for you.  Avoid raw meat and uncooked cheese.  If you feel sick to your stomach (nauseous) or throw up (vomit): ? Eat 4 or 5 small meals a day instead of 3 large meals. ? Try eating a few soda crackers. ? Drink liquids between meals instead of during meals.  To prevent constipation: ? Eat foods that are high in fiber, like fresh fruits and vegetables, whole grains, and beans. ? Drink enough fluids to keep your pee (urine) clear or pale yellow. Activity  Exercise only as told by your doctor. Stop exercising if you have cramps or pain in your lower belly (abdomen) or low back.  Do not exercise if it is too hot, too humid, or if you are in a place of great height (high altitude).  Try to avoid standing for long periods of time. Move your legs often if you must stand in one place for a long time.  Avoid heavy lifting.  Wear low-heeled shoes. Sit and stand up  straight.  You can have sex unless your doctor tells you not to. Relieving pain and discomfort  Wear a good support bra if your breasts are sore.  Take warm water baths (sitz baths) to soothe pain or discomfort caused by hemorrhoids. Use hemorrhoid cream if your doctor says it is okay.  Rest with your legs raised if you have leg cramps or low back pain.  If you have puffy, bulging veins (varicose veins) in your legs: ? Wear support hose or compression stockings as told by your doctor. ? Raise (elevate) your feet for 15 minutes, 3-4 times a day. ? Limit salt in your food. Prenatal care  Schedule your prenatal visits by the twelfth week of pregnancy.  Write down your questions. Take them to your prenatal visits.  Keep all your prenatal visits as told by your doctor. This is important. Safety  Wear your seat belt at all times when driving.  Make a list of emergency phone numbers. The list should include numbers for family, friends, the hospital, and police and fire departments. General instructions  Ask your doctor for a referral to a local prenatal class. Begin classes no later than at the start of month 6 of your pregnancy.  Ask for help if you need counseling or if you need help with nutrition. Your doctor can give you advice or tell you where to go for help.  Do not use hot tubs,  steam rooms, or saunas.  Do not douche or use tampons or scented sanitary pads.  Do not cross your legs for long periods of time.  Avoid all herbs and alcohol. Avoid drugs that are not approved by your doctor.  Do not use any tobacco products, including cigarettes, chewing tobacco, and electronic cigarettes. If you need help quitting, ask your doctor. You may get counseling or other support to help you quit.  Avoid cat litter boxes and soil used by cats. These carry germs that can cause birth defects in the baby and can cause a loss of your baby (miscarriage) or stillbirth.  Visit your dentist.  At home, brush your teeth with a soft toothbrush. Be gentle when you floss. Contact a doctor if:  You are dizzy.  You have mild cramps or pressure in your lower belly.  You have a nagging pain in your belly area.  You continue to feel sick to your stomach, you throw up, or you have watery poop (diarrhea).  You have a bad smelling fluid coming from your vagina.  You have pain when you pee (urinate).  You have increased puffiness (swelling) in your face, hands, legs, or ankles. Get help right away if:  You have a fever.  You are leaking fluid from your vagina.  You have spotting or bleeding from your vagina.  You have very bad belly cramping or pain.  You gain or lose weight rapidly.  You throw up blood. It may look like coffee grounds.  You are around people who have [place] measles, fifth disease, or chickenpox.  You have a very bad headache.  You have shortness of breath.  You have any kind of trauma, such as from a fall or a car accident. Summary  The first trimester of pregnancy is from week 1 until the end of week 13 (months 1 through 3).  To take care of yourself and your unborn baby, you will need to eat healthy meals, take medicines only if your doctor tells you to do so, and do activities that are safe for you and your baby.  Keep all follow-up visits as told by your doctor. This is important as your doctor will have to ensure that your baby is healthy and growing well. This information is not intended to replace advice given to you by your health care provider. Make sure you discuss any questions you have with your health care provider. Document Revised: 08/01/2018 Document Reviewed: 04/18/2016 Elsevier Patient Education  2020 [place] is the process of stopping breastfeeding. This may be a natural process that takes place on its own over time, or there may be a reason you need to stop breastfeeding. If possible, wait until your baby  is at least 68 months old before weaning. With a little time and preparation, weaning can be a positive experience. When is the best time to stop breastfeeding? There is no right or wrong time. What is best for you and your child may be different from what is best for other mothers and their children. It is recommended that you:  Feed your baby only breast milk for the first 6 months.  Start to introduce solid foods when your baby is around 98 months old. Feed your baby both breast milk and solid food for another 6 months.  Continue to breastfeed your baby as long as both you and your child want to after 12 months. Your baby may start to wean himself or herself  at about 6 months. This may happen when:  Your baby starts to eat more solid food. Your baby may still prefer to nurse in order to get fluids.  Your baby is able to drink from a cup. This may prompt your baby to breastfeed less often.  Your baby gradually becomes less interested in breastfeeding as he or she gets used to drinking other fluids.  Your baby slowly starts to drop one breastfeeding session every 2-3 days. Sometimes, becoming pregnant again can signal a time to wean. During pregnancy, your milk supply naturally decreases, and your milk may start to have a less desirable taste. How do I start weaning? Wean gradually over several weeks. Some guidelines to follow are:  Encourage your baby to try different feeding methods, such as: ? Drinking pre-pumped (expressed) breast milk out of a cup. ? Drinking from a bottle, if he or she will not use a cup. ? Having some else offer the first cup or bottle feeding. This will make it less confusing for your baby.  When cup or bottle feeding is successful, and your baby is getting enough nutrition that way, you can substitute a cup feeding for one breastfeeding session a day.  Over several weeks, replace one more breastfeeding session with cup feeding every few days. This may be easier  if your baby will drink something from the cup besides your expressed breast milk.  If you are returning to work, start by replacing normal feedings during work hours with cup or bottle feedings.  If your child is around 26 months old, start giving your child small amounts of one-ingredient nutrient-rich solid food in addition to breast milk. Common first foods include: ? Pureed or mashed banana. ? Unsweetened apple sauce. ? Pureed or mashed avocado. ? Cooked and mashed sweet potato. ? Infant rice cereal that has added iron (iron-fortified).  Pay attention to your child's behavior and emotional needs when weaning. Consider replacing breastfeeding with a soothing activity, such as story time. Providing your baby with a routine can help them feel secure. Your breasts may feel full and uncomfortable at times during weaning. It may help to express a small amount of breast milk to relieve some tension, but do not completely empty your breasts. Where to find support Your health care provider or a lactation consultant can help you during the weaning process. They can help you decide which foods are best to introduce first and which fluids you can start substituting for breast milk. They can also help if you have any problems related to breastfeeding or weaning. Contact a health care provider if:  One or both breasts become firm, painful, or red.  Your baby suddenly stops nursing (nursing strike).  Your baby is not gaining weight.  You develop a fever. Summary  Weaning is the process of stopping breastfeeding. This may be a natural process that takes place on its own over time, or there may be a reason you need to stop breastfeeding.  The best time for you and your baby to wean may be different from what is best for other mothers and their children.  Your baby may start to wean himself or herself at about 6 months, when they start eating more solid food or drinking from a cup.  Your health  care provider or a lactation consultant can help you if you have any problems related to breastfeeding or weaning. This information is not intended to replace advice given to you by your health care provider.  Make sure you discuss any questions you have with your health care provider. Document Revised: 07/19/2016 Document Reviewed: 07/19/2016 Elsevier Patient Education  [place].  Breastfeeding During Pregnancy Deciding whether to continue breastfeeding during a pregnancy is an individual choice. Breastfeeding during pregnancy is generally not risky. Your nursing child may naturally stop breastfeeding (wean) during your pregnancy. If you have problems during pregnancy, you may be advised to stop breastfeeding. Work with your health care provider to help decide if breastfeeding during pregnancy is right for you. What should I consider when deciding whether to breastfeed during pregnancy? When deciding whether to continue breastfeeding while you are pregnant, you may want to consider:  The age of your nursing child and his or her physical and emotional needs.  Any health concerns related to your pregnancy. It may not be safe to continue breastfeeding if you have certain problems, such as: ? Uterine pain or bleeding. ? A history of preterm labor and delivery. ? Problems gaining weight or losing weight during pregnancy. ? A history of cervical insufficiency. This is a condition in which the cervix begins to thin and soften before your due date.  Whether you have any problems associated with breastfeeding. Some common problems experienced when breastfeeding during pregnancy include: ? Nipple tenderness and breast soreness. ? Nausea. ? Discomfort while breastfeeding due to the growing belly. ? Fatigue. ? Reduced milk supply. This may mean having fewer feedings a day. ? Changes in how your milk tastes. ? Uterine contractions. Follow these instructions at home:   Keep an open mind  about how your breastfeeding experience will be. Avoid setting rigid expectations for yourself. Your needs and your nursing child's needs are likely to change as your pregnancy progresses.  Make sure that you are gaining a healthy amount of weight and eating enough calories.  Eat a healthy diet that includes fresh fruits and vegetables, whole grains, lean meat, fish, eggs, beans, nuts, and seeds, and low-fat dairy products.  Drink plenty of fluids so your urine is clear or pale yellow.  Work with your health care provider and your child's health care provider as needed.  Keep track of your nursing child's weight. If your nursing baby is younger than twelve months, the normal decrease of your milk supply that happens during pregnancy could keep your baby from getting all the milk he or she needs.  Keep track of your nursing child's daily wet diapers and bowel movements to make sure he or she is staying hydrated. Your baby should have 6-8 wet diapers and at least 3 stools each day.  Talk to your health care provider or lactation specialist before starting any new medications or supplements. This is to make sure that they are safe for both pregnancy and breastfeeding. Where to find more information  [employer]: http://www.llli.org Contact a health care provider if:  Your breasts become large and painful (engorged).  Your nursing child is urinating or having bowel movements less often than normal. Summary  Breastfeeding during pregnancy is generally not risky. However, if you have problems during pregnancy, you may be advised to stop breastfeeding.  During pregnancy, your milk supply naturally decreases. Your nursing child may wean himself or herself naturally during your pregnancy.  Keep track of your nursing child's weight, wet diapers, and stools to make sure that he or she is getting enough milk.  Keep an open mind about how your breastfeeding experience will be.  Avoid setting rigid expectations for yourself. This  information is not intended to replace advice given to you by your health care provider. Make sure you discuss any questions you have with your health care provider. Document Revised: 08/15/2016 Document Reviewed: 04/11/2016 Elsevier Patient Education  [place].   Common Medications Safe in Pregnancy  Acne:      Constipation:  Benzoyl Peroxide     Colace  Clindamycin      Dulcolax Suppository  [place]ica Erythromycin     Fibercon  Salicylic Acid      Metamucil         Miralax AVOID:        Senakot   Accutane    Cough:  Retin-A       Cough Drops  Tetracycline      Phenergan w/ Codeine if Rx  Minocycline      Robitussin (Plain & DM)  Antibiotics:     Crabs/Lice:  Ceclor       RID  Cephalosporins    AVOID:  E-Mycins      Kwell  Keflex  Macrobid/Macrodantin   Diarrhea:  Penicillin      Kao-Pectate  Zithromax      Imodium AD         PUSH FLUIDS AVOID:       Cipro     Fever:  Tetracycline      Tylenol (Regular or Extra  Minocycline       Strength)  Levaquin      Extra Strength-Do not          Exceed 8 tabs/24 hrs Caffeine:        <292m/day (equiv. To 1 cup of coffee or  approx. 3 12 oz sodas)         Gas: Cold/Hayfever:       Gas-X  Benadryl      Mylicon  Claritin       Phazyme  **Claritin-D        Chlor-Trimeton    Headaches:  Dimetapp      ASA-Free Excedrin  Drixoral-Non-Drowsy     Cold Compress  Mucinex (Guaifenasin)     Tylenol (Regular or Extra  Sudafed/Sudafed-12 Hour     Strength)  **Sudafed PE Pseudoephedrine   Tylenol Cold & Sinus     Vicks Vapor Rub  Zyrtec  **AVOID if Problems With Blood Pressure         Heartburn: Avoid lying down for at least 1 hour after meals  Aciphex      Maalox     Rash:  Milk of Magnesia     Benadryl    Mylanta       1% Hydrocortisone Cream  Pepcid  Pepcid Complete   Sleep Aids:  Prevacid      Ambien   Prilosec       Benadryl  Rolaids       Chamomile Tea  Tums  (Limit 4/day)     Unisom         Tylenol PM         Warm milk-add vanilla or  Hemorrhoids:       Sugar for taste  Anusol/Anusol H.C.  (RX: Analapram 2.5%)  Sugar Substitutes:  Hydrocortisone OTC     Ok in moderation  Preparation H      Tucks        Vaseline lotion applied to tissue with wiping    Herpes:     Throat:  Acyclovir      Oragel  Famvir  Valtrex  Vaccines:         Flu Shot Leg Cramps:       *Gardasil  Benadryl      Hepatitis A         Hepatitis B Nasal Spray:       Pneumovax  Saline Nasal Spray     Polio Booster         Tetanus Nausea:       Tuberculosis test or PPD  Vitamin B6 25 mg TID   AVOID:    Dramamine      *Gardasil  Emetrol       Live Poliovirus  Ginger Root 250 mg QID    MMR (measles, mumps &  High Complex Carbs @ Bedtime    rebella)  Sea Bands-Accupressure    Varicella (Chickenpox)  Unisom 1/2 tab TID     *No known complications           If received before Pain:         Known pregnancy;   Darvocet       Resume series after  Lortab        Delivery  Percocet    Yeast:   Tramadol      Femstat  Tylenol 3      Gyne-lotrimin  Ultram       Monistat  Vicodin           MISC:         All Sunscreens           Hair Coloring/highlights          Insect Repellant's          (Including DEET)         Mystic Tans   Morning Sickness  Morning sickness is when you feel sick to your stomach (nauseous) during pregnancy. You may feel sick to your stomach and throw up (vomit). You may feel sick in the morning, but you can feel this way at any time of day. Some women feel very sick to their stomach and cannot stop throwing up (hyperemesis gravidarum). Follow these instructions at home: Medicines  Take over-the-counter and prescription medicines only as told by your doctor. Do not take any medicines until you talk with your doctor about them first.  Taking multivitamins before getting pregnant can stop or lessen the harshness of morning sickness. Eating and  drinking  Eat dry toast or crackers before getting out of bed.  Eat 5 or 6 small meals a day.  Eat dry and bland foods like rice and baked potatoes.  Do not eat greasy, fatty, or spicy foods.  Have someone cook for you if the smell of food causes you to feel sick or throw up.  If you feel sick to your stomach after taking prenatal vitamins, take them at night or with a snack.  Eat protein when you need a snack. Nuts, yogurt, and cheese are good choices.  Drink fluids throughout the day.  Try ginger ale made with real ginger, ginger tea made from fresh grated ginger, or ginger candies. General instructions  Do not use any products that have nicotine or tobacco in them, such as cigarettes and e-cigarettes. If you need help quitting, ask your doctor.  Use an air purifier to keep the air in your house free of smells.  Get lots of fresh air.  Try to avoid smells that make you feel sick.  Try: ? Wearing a bracelet that is used for seasickness (acupressure wristband). ?  Going to a doctor who puts thin needles into certain body points (acupuncture) to improve how you feel. Contact a doctor if:  You need medicine to feel better.  You feel dizzy or light-headed.  You are losing weight. Get help right away if:  You feel very sick to your stomach and cannot stop throwing up.  You pass out (faint).  You have very bad pain in your belly. Summary  Morning sickness is when you feel sick to your stomach (nauseous) during pregnancy.  You may feel sick in the morning, but you can feel this way at any time of day.  Making some changes to what you eat may help your symptoms go away. This information is not intended to replace advice given to you by your health care provider. Make sure you discuss any questions you have with your health care provider. Document Revised: 03/23/2017 Document Reviewed: 05/11/2016 Elsevier Patient Education  2020 [employer].

## 2019-12-18 NOTE — Progress Notes (Signed)
GYN ENCOUNTER NOTE  Subjective:       Regina Gray is a 29 y.o. G55P1011 female here for pregnancy confirmation.   Reports positive home pregnancy test. Notes irregular menses due to lactation.   Endorses nausea with vomiting, breast tenderness and intermittent abdominal cramping.   Denies difficulty breathing or respiratory distress, chest pain, abdominal pain, vaginal bleeding, dysuria, and leg pain or swelling.    Gynecologic History  Patient's last menstrual period was 10/07/2019 (within days).   Estimated date of birth: 07/13/2022  Gestational age: 76 weeks 2 days  Contraception: none  Last Pap: 05/2019. 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 No pertinent past medical history.  Past Surgical History:  Procedure Laterality Date   ANTERIOR CRUCIATE LIGAMENT REPAIR     APPENDECTOMY     WISDOM TOOTH EXTRACTION      Current Outpatient Medications on File Prior to Visit  Medication Sig Dispense Refill   Multiple Vitamin (MULTIVITAMIN ADULT) TABS Take by mouth.     Prenatal Vit-Fe Fumarate-FA (MULTIVITAMIN-PRENATAL) 27-0.8 MG TABS tablet Take 1 tablet by mouth daily at 12 noon.     Vitamin D, Ergocalciferol, (DRISDOL) 1.25 MG (50000 UT) CAPS capsule Take 1 capsule (50,000 Units total) by mouth every 7 (seven) days. 12 capsule 0   LORazepam (ATIVAN) 0.5 MG tablet Take 1 tablet (0.5 mg total) by mouth at bedtime as needed for anxiety. 30 tablet 0   sertraline (ZOLOFT) 50 MG tablet Take 1 tablet (50 mg total) by mouth daily. 30 tablet 6   No current facility-administered medications on  file prior to visit.    Allergies  Allergen Reactions   Codeine    Hydromorphone     Other reaction(s): Vomiting   Oxycodone     Other reaction(s): Vomiting   Sulfa Antibiotics     Social History   Socioeconomic History   Marital status: Married    Spouse name: Not on file   Number of children: Not on file   Years of education: Not on file   Highest education level: Not on file  Occupational History   Not on file  Tobacco Use   Smoking status: Never Smoker   Smokeless tobacco: Never Used  Vaping Use   Vaping Use: Never used  Substance and Sexual Activity   Alcohol use: Not Currently   Drug use: No   Sexual activity: Yes    Birth control/protection: Condom  Other Topics Concern   Not on file  Social History Narrative   Not on file   Social Determinants of Health   Financial Resource Strain:    Difficulty of Paying Living Expenses: Not on file  Food Insecurity:    Worried About 2015 in the Last Year: Not on file   Programme researcher, broadcasting/film/video of Food in the Last Year: Not on file  Transportation Needs:    Lack of Transportation (Medical): Not on file  Lack of Transportation (Non-Medical): Not on file  Physical Activity:    Days of Exercise per Week: Not on file   Minutes of Exercise per Session: Not on file  Stress:    Feeling of Stress : Not on file  Social Connections:    Frequency of Communication with Friends and Family: Not on file   Frequency of Social Gatherings with Friends and Family: Not on file   Attends Religious Services: Not on file   Active Member of Clubs or Organizations: Not on file   Attends Banker Meetings: Not on file   Marital Status: Not on file  Intimate Partner Violence:    Fear of Current or Ex-Partner: Not on file   Emotionally Abused: Not on file   Physically Abused: Not on file   Sexually Abused: Not on file    Family History  Problem Relation Age of Onset   Heart attack  Maternal Grandfather    Healthy Mother    Healthy Father    Breast cancer Neg Hx    Ovarian cancer Neg Hx    Colon cancer Neg Hx    Diabetes Neg Hx     The following portions of the patient's history were reviewed and updated as appropriate: allergies, current medications, past family history, past medical history, past social history, past surgical history and problem list.  Review of Systems  ROS negative except as noted above. Information obtained from patient.   Objective:   BP 112/75    Pulse 69    Ht 5\' 9"  (1.753 m)    Wt 172 lb 14.4 oz (78.4 kg)    LMP 10/07/2019 (Within Days)    Breastfeeding Yes    BMI 25.53 kg/m   CONSTITUTIONAL: Well-developed, well-nourished female in no acute distress.   ULTRASOUND REPORT  Location: [facility] Date of Service: 12/18/2019   Indications:dating Findings:  12/20/2019 intrauterine pregnancy is visualized with a CRL consistent with [redacted]w[redacted]d gestation, giving an (U/S) EDD of 07/23/2020.   FHR: 182 BPM CRL measurement: 22.4 mm Yolk sac is visualized and appears normal and early anatomy is normal. Amnion: visualized and appears normal   Right Ovary is normal in appearance. Left Ovary is normal appearance. Corpus luteal cyst:  Right ovary Survey of the adnexa demonstrates no adnexal masses. There is no free peritoneal fluid in the cul de sac.  Impression: 1. [redacted]w[redacted]d Viable Singleton Intrauterine pregnancy by U/S. 2. (U/S) EDD is consistent with Clinically established EDD of 07/23/2020.  Recommendations: 1.Clinical correlation with the patient's History and Physical Exam.  Recent Results (from the past 2160 hour(s))  POCT urine pregnancy     Status: Abnormal   Collection Time: 12/18/19 10:34 AM  Result Value Ref Range   Preg Test, Ur Positive (A) Negative    Assessment:   1. Missed menses  - POCT urine pregnancy - 12/20/19 OB LESS THAN 14 WEEKS WITH OB TRANSVAGINAL; Future  2. Irregular menses  - US OB LESS THAN 14  WEEKS WITH OB TRANSVAGINAL; Future  3. Unsure of LMP (last menstrual period) as reason for ultrasound scan  - US OB LESS THAN 14 WEEKS WITH OB TRANSVAGINAL; Future  4. MTHFR mutation (HCC)  Plan:   First trimester education, see AVS.   Rx Aspirin, see orders.   Reviewed red flag symptoms and when to call.   RTC x 1 week for intake.   RTC x 3 weeks for NOB PE or sooner if needed.    [place], CNM  [facility], [facility] 12/18/19 5:42 PM

## 2019-12-18 NOTE — Addendum Note (Signed)
Addended by: Shaune Spittle on: 12/18/2019 05:43 PM   Modules accepted: Orders

## 2019-12-25 ENCOUNTER — Other Ambulatory Visit: Payer: Self-pay

## 2019-12-25 ENCOUNTER — Ambulatory Visit (INDEPENDENT_AMBULATORY_CARE_PROVIDER_SITE_OTHER): Payer: Commercial Managed Care - PPO | Admitting: Certified Nurse Midwife

## 2019-12-25 VITALS — BP 136/86 | HR 87 | Wt 179.1 lb

## 2019-12-25 DIAGNOSIS — Z3A09 9 weeks gestation of pregnancy: Secondary | ICD-10-CM

## 2019-12-25 DIAGNOSIS — Z3491 Encounter for supervision of normal pregnancy, unspecified, first trimester: Secondary | ICD-10-CM | POA: Diagnosis not present

## 2019-12-25 LAB — OB RESULTS CONSOLE GC/CHLAMYDIA: Gonorrhea: NEGATIVE

## 2019-12-25 LAB — OB RESULTS CONSOLE VARICELLA ZOSTER ANTIBODY, IGG: Varicella: IMMUNE

## 2019-12-25 NOTE — Progress Notes (Signed)
Regina Gray presents for NOB nurse interview visit. Pregnancy confirmation done 12/18/19. LMP approx. 10/07/19. U/S 12/18/19- 8 weeks 6 days EDD 07/23/20. G3 P1 0 1 1. Pregnancy education material explained and given.1 cat in the home. NOB labs ordered. HIV labs and Drug screen declined. PNV encouraged. Genetic screening options discussed. Genetic testing:to be done at NOB PE. Pt. To follow up with provider in 2 weeks for NOB physical.  All questions answered. FMLA paper explained and signed. Financial policy reviewed and understood.

## 2019-12-26 ENCOUNTER — Encounter: Payer: Self-pay | Admitting: Certified Nurse Midwife

## 2019-12-26 DIAGNOSIS — Z6710 Type A blood, Rh positive: Secondary | ICD-10-CM | POA: Insufficient documentation

## 2019-12-26 LAB — URINALYSIS, ROUTINE W REFLEX MICROSCOPIC
Bilirubin, UA: NEGATIVE
Glucose, UA: NEGATIVE
Ketones, UA: NEGATIVE
Nitrite, UA: NEGATIVE
Protein,UA: NEGATIVE
RBC, UA: NEGATIVE
Specific Gravity, UA: 1.018 (ref 1.005–1.030)
Urobilinogen, Ur: 0.2 mg/dL (ref 0.2–1.0)
pH, UA: 8 — ABNORMAL HIGH (ref 5.0–7.5)

## 2019-12-26 LAB — TOXOPLASMA ANTIBODIES- IGG AND  IGM
Toxoplasma Antibody- IgM: <3 AU/mL (ref 0.0–7.9)
Toxoplasma IgG Ratio: <3 IU/mL (ref 0.0–7.1)

## 2019-12-26 LAB — MICROSCOPIC EXAMINATION
Casts: NONE SEEN /lpf
Epithelial Cells (non renal): >10 /hpf — AB (ref 0–10)

## 2019-12-26 LAB — RPR: RPR Ser Ql: NONREACTIVE

## 2019-12-26 LAB — ANTIBODY SCREEN: Antibody Screen: NEGATIVE

## 2019-12-26 LAB — HEPATITIS B SURFACE ANTIGEN: Hepatitis B Surface Ag: NEGATIVE

## 2019-12-26 LAB — ABO AND RH: Rh Factor: POSITIVE

## 2019-12-26 LAB — VARICELLA ZOSTER ANTIBODY, IGG: Varicella zoster IgG: 1943 index (ref >165)

## 2019-12-26 LAB — RUBELLA SCREEN: Rubella Antibodies, IGG: 1.47 index (ref >0.99)

## 2019-12-26 NOTE — Progress Notes (Signed)
I have reviewed the patient record and concur with patient management and plan of care.    Serafina Royals, CNM [facility], [facility]

## 2019-12-27 LAB — URINE CULTURE

## 2019-12-27 LAB — GC/CHLAMYDIA PROBE AMP
Chlamydia trachomatis, NAA: NEGATIVE
Neisseria Gonorrhoeae by PCR: NEGATIVE

## 2020-01-02 ENCOUNTER — Other Ambulatory Visit: Payer: Commercial Managed Care - PPO

## 2020-01-02 ENCOUNTER — Encounter: Payer: Self-pay | Admitting: Certified Nurse Midwife

## 2020-01-02 ENCOUNTER — Other Ambulatory Visit: Payer: Self-pay

## 2020-01-02 ENCOUNTER — Telehealth: Payer: Self-pay | Admitting: Certified Nurse Midwife

## 2020-01-02 ENCOUNTER — Telehealth: Payer: Self-pay

## 2020-01-02 DIAGNOSIS — Z3A11 11 weeks gestation of pregnancy: Secondary | ICD-10-CM

## 2020-01-02 DIAGNOSIS — Z3481 Encounter for supervision of other normal pregnancy, first trimester: Secondary | ICD-10-CM

## 2020-01-02 NOTE — Telephone Encounter (Signed)
Called [employer] from [place]. Lmtrc

## 2020-01-02 NOTE — Telephone Encounter (Signed)
Called pt and told her that I can add her on her the lab today at 9:30,  The pt wanted to know if she was having more than one lab, I told her I would send message to michelle. Marcelino Duster said she will have CBC and genetic screening for her lab. I called the pt and let her know

## 2020-01-03 LAB — CBC
Hematocrit: 40.9 % (ref 34.0–46.6)
Hemoglobin: 13.5 g/dL (ref 11.1–15.9)
MCH: 29.2 pg (ref 26.6–33.0)
MCHC: 33 g/dL (ref 31.5–35.7)
MCV: 89 fL (ref 79–97)
Platelets: 282 10*3/uL (ref 150–450)
RBC: 4.62 x10E6/uL (ref 3.77–5.28)
RDW: 13 % (ref 11.7–15.4)
WBC: 7.3 10*3/uL (ref 3.4–10.8)

## 2020-01-08 ENCOUNTER — Encounter: Payer: Commercial Managed Care - PPO | Admitting: Certified Nurse Midwife

## 2020-01-12 ENCOUNTER — Telehealth: Payer: Self-pay

## 2020-01-12 NOTE — Telephone Encounter (Signed)
mychart message sent

## 2020-01-14 ENCOUNTER — Encounter: Payer: Commercial Managed Care - PPO | Admitting: Certified Nurse Midwife

## 2020-01-23 ENCOUNTER — Ambulatory Visit (INDEPENDENT_AMBULATORY_CARE_PROVIDER_SITE_OTHER): Payer: Commercial Managed Care - PPO | Admitting: Certified Nurse Midwife

## 2020-01-23 ENCOUNTER — Other Ambulatory Visit: Payer: Self-pay

## 2020-01-23 ENCOUNTER — Encounter: Payer: Self-pay | Admitting: Certified Nurse Midwife

## 2020-01-23 VITALS — BP 133/87 | HR 91 | Wt 180.8 lb

## 2020-01-23 DIAGNOSIS — Z3482 Encounter for supervision of other normal pregnancy, second trimester: Secondary | ICD-10-CM

## 2020-01-23 DIAGNOSIS — O219 Vomiting of pregnancy, unspecified: Secondary | ICD-10-CM

## 2020-01-23 DIAGNOSIS — Z1589 Genetic susceptibility to other disease: Secondary | ICD-10-CM

## 2020-01-23 DIAGNOSIS — Z3A14 14 weeks gestation of pregnancy: Secondary | ICD-10-CM

## 2020-01-23 LAB — POCT URINALYSIS DIPSTICK OB
Bilirubin, UA: NEGATIVE
Blood, UA: NEGATIVE
Glucose, UA: NEGATIVE
Ketones, UA: NEGATIVE
Leukocytes, UA: NEGATIVE
Nitrite, UA: NEGATIVE
POC,PROTEIN,UA: NEGATIVE
Spec Grav, UA: 1.015 (ref 1.010–1.025)
Urobilinogen, UA: 0.2 E.U./dL
pH, UA: 7 (ref 5.0–8.0)

## 2020-01-23 MED ORDER — ONDANSETRON 4 MG PO TBDP: 4.0000 mg | ORAL_TABLET | Freq: Four times a day (QID) | ORAL | 2 refills | Status: DC | PRN

## 2020-01-23 NOTE — Patient Instructions (Signed)
Ondansetron tablets What is this medicine? ONDANSETRON (on DAN se tron) is used to treat nausea and vomiting caused by chemotherapy. It is also used to prevent or treat nausea and vomiting after surgery. This medicine may be used for other purposes; ask your health care provider or pharmacist if you have questions. COMMON BRAND NAME(S): Zofran What should I tell my health care provider before I take this medicine? They need to know if you have any of these conditions:  heart disease  history of irregular heartbeat  liver disease  low levels of magnesium or potassium in the blood  an unusual or allergic reaction to ondansetron, granisetron, other medicines, foods, dyes, or preservatives  pregnant or trying to get pregnant  breast-feeding How should I use this medicine? Take this medicine by mouth with a glass of water. Follow the directions on your prescription label. Take your doses at regular intervals. Do not take your medicine more often than directed. Talk to your pediatrician regarding the use of this medicine in children. Special care may be needed. Overdosage: If you think you have taken too much of this medicine contact a poison control center or emergency room at once. NOTE: This medicine is only for you. Do not share this medicine with others. What if I miss a dose? If you miss a dose, take it as soon as you can. If it is almost time for your next dose, take only that dose. Do not take double or extra doses. What may interact with this medicine? Do not take this medicine with any of the following medications:  apomorphine  certain medicines for fungal infections like fluconazole, itraconazole, ketoconazole, posaconazole, voriconazole  cisapride  dronedarone  pimozide  thioridazine This medicine may also interact with the following medications:  carbamazepine  certain medicines for depression, anxiety, or psychotic disturbances  fentanyl  linezolid  MAOIs  like Carbex, Eldepryl, Marplan, Nardil, and Parnate  methylene blue (injected into a vein)  other medicines that prolong the QT interval (cause an abnormal heart rhythm) like dofetilide, ziprasidone  phenytoin  rifampicin  tramadol This list may not describe all possible interactions. Give your health care provider a list of all the medicines, herbs, non-prescription drugs, or dietary supplements you use. Also tell them if you smoke, drink alcohol, or use illegal drugs. Some items may interact with your medicine. What should I watch for while using this medicine? Check with your doctor or health care professional right away if you have any sign of an allergic reaction. What side effects may I notice from receiving this medicine? Side effects that you should report to your doctor or health care professional as soon as possible:  allergic reactions like skin rash, itching or hives, swelling of the face, lips or tongue  breathing problems  confusion  dizziness  fast or irregular heartbeat  feeling faint or lightheaded, falls  fever and chills  loss of balance or coordination  seizures  sweating  swelling of the hands or feet  tightness in the chest  tremors  unusually weak or tired Side effects that usually do not require medical attention (report to your doctor or health care professional if they continue or are bothersome):  constipation or diarrhea  headache This list may not describe all possible side effects. Call your doctor for medical advice about side effects. You may report side effects to FDA at 1-800-FDA-1088. Where should I keep my medicine? Keep out of the reach of children. Store between 2 and 30 degrees   C (36 and 86 degrees F). Throw away any unused medicine after the expiration date. NOTE: This sheet is a summary. It may not cover all possible information. If you have questions about this medicine, talk to your doctor, pharmacist, or health care  provider.  2020 Elsevier/Gold Standard (2018-04-02 07:16:43)   

## 2020-01-23 NOTE — Progress Notes (Signed)
NEW OB HISTORY AND PHYSICAL  SUBJECTIVE:       Regina Gray is a 29 y.o. G73P1011 female, Patient's last menstrual period was 10/07/2019 (within days)., Estimated Date of Delivery: 07/23/20, [redacted]w[redacted]d, presents today for establishment of Prenatal Care.  Reports nausea and vomiting with occasional headaches. Requests zofran prescription.   Denies difficulty breathing or respiratory distress, chest pain, abdominal pain, vaginal bleeding, dysuria, and leg pain or swelling.    Gynecologic History  Patient's last menstrual period was 10/07/2019 (within days).   Contraception: none  Last Pap: 05/2019. 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 No pertinent past medical history.  Past Surgical History:  Procedure Laterality Date  . ANTERIOR CRUCIATE LIGAMENT REPAIR    . APPENDECTOMY    . WISDOM TOOTH EXTRACTION      Current Outpatient Medications on File Prior to Visit  Medication Sig Dispense Refill  . aspirin EC 81 MG tablet Take 1 tablet (81 mg total) by mouth daily. 300 tablet 2  . doxylamine, Sleep, (UNISOM) 25 MG tablet Take 25 mg by mouth at bedtime as needed.    . Multiple Vitamin (MULTIVITAMIN ADULT) TABS Take by mouth. (Patient not taking: Reported on 01/23/2020)    . Prenatal Vit-Fe Fumarate-FA (MULTIVITAMIN-PRENATAL) 27-0.8 MG TABS tablet Take 1 tablet by mouth daily at 12 noon. (Patient not taking: Reported on 01/23/2020)    . pyridOXINE (VITAMIN B-6) 100 MG tablet Take 100 mg by mouth daily. (Patient not taking: Reported on 01/23/2020)    . Vitamin D, Ergocalciferol, (DRISDOL)  1.25 MG (50000 UT) CAPS capsule Take 1 capsule (50,000 Units total) by mouth every 7 (seven) days. (Patient not taking: Reported on 01/23/2020) 12 capsule 0   No current facility-administered medications on file prior to visit.    Allergies  Allergen Reactions  . Codeine   . Hydromorphone     Other reaction(s): Vomiting  . Oxycodone     Other reaction(s): Vomiting  . Sulfa Antibiotics     Social History   Socioeconomic History  . Marital status: Married    Spouse name: Not on file  . Number of children: Not on file  . Years of education: Not on file  . Highest education level: Not on file  Occupational History  . Not on file  Tobacco Use  . Smoking status: Never Smoker  . Smokeless tobacco: Never Used  Vaping Use  . Vaping Use: Never used  Substance and Sexual Activity  . Alcohol use: Not Currently  . Drug use: No  . Sexual activity: Yes    Birth control/protection: None  Other Topics Concern  . Not on file  Social History Narrative  . Not on file   Social Determinants of Health   Financial Resource Strain:   . Difficulty of Paying Living Expenses: Not on file  Food Insecurity:   . Worried  About Running Out of Food in the Last Year: Not on file  . Ran Out of Food in the Last Year: Not on file  Transportation Needs:   . Lack of Transportation (Medical): Not on file  . Lack of Transportation (Non-Medical): Not on file  Physical Activity:   . Days of Exercise per Week: Not on file  . Minutes of Exercise per Session: Not on file  Stress:   . Feeling of Stress : Not on file  Social Connections:   . Frequency of Communication with Friends and Family: Not on file  . Frequency of Social Gatherings with Friends and Family: Not on file  . Attends Religious Services: Not on file  . Active Member of Clubs or Organizations: Not on file  . Attends Banker Meetings: Not on file  . Marital Status: Not on file  Intimate Partner Violence:   . Fear of Current  or Ex-Partner: Not on file  . Emotionally Abused: Not on file  . Physically Abused: Not on file  . Sexually Abused: Not on file    Family History  Problem Relation Age of Onset  . Heart attack Maternal Grandfather   . Healthy Mother   . Healthy Father   . Breast cancer Neg Hx   . Ovarian cancer Neg Hx   . Colon cancer Neg Hx   . Diabetes Neg Hx     The following portions of the patient's history were reviewed and updated as appropriate: allergies, current medications, past OB history, past medical history, past surgical history, past family history, past social history, and problem list.  Review of Systems:  ROS negative except as noted above. Information obtained from patient.   OBJECTIVE:  BP 133/87   Pulse 91   Wt 180 lb 12.8 oz (82 kg)   LMP 10/07/2019 (Within Days)   BMI 26.70 kg/m   Initial Physical Exam (New OB)  GENERAL APPEARANCE: alert, well appearing, in no apparent distress  HEAD: normocephalic, atraumatic  MOUTH: mucous membranes moist, pharynx normal without lesions  THYROID: no thyromegaly or masses present  BREASTS: patient declined exam  LUNGS: clear to auscultation, no wheezes, rales or rhonchi, symmetric air entry  HEART: regular rate and rhythm, no murmurs  ABDOMEN: soft, nontender, nondistended, no abnormal masses, no epigastric pain, fundus soft, nontender 14 weeks size and FHT present  EXTREMITIES: no redness or tenderness in the calves or thighs, no edema  SKIN: normal coloration and turgor, no rashes  NEUROLOGIC: alert, oriented, normal speech, no focal findings or movement disorder noted  PELVIC EXAM: patient declined exam  ASSESSMENT: Normal pregnancy Rh positive Nausea and vomiting in pregnancy Headaches in pregnancy  PLAN: Prenatal care New OB counseling: The patient has been given an overview regarding routine prenatal care. Recommendations regarding diet, weight gain, and exercise in pregnancy were given. Prenatal  testing, optional genetic testing, and ultrasound use in pregnancy were reviewed.  Benefits of Breast Feeding were discussed. The patient is encouraged to consider nursing her baby post partum. See orders

## 2020-02-20 ENCOUNTER — Other Ambulatory Visit: Payer: Self-pay

## 2020-02-20 ENCOUNTER — Ambulatory Visit (INDEPENDENT_AMBULATORY_CARE_PROVIDER_SITE_OTHER): Payer: Commercial Managed Care - PPO | Admitting: Certified Nurse Midwife

## 2020-02-20 ENCOUNTER — Encounter: Payer: Self-pay | Admitting: Certified Nurse Midwife

## 2020-02-20 VITALS — BP 115/78 | HR 87 | Wt 182.3 lb

## 2020-02-20 DIAGNOSIS — Z3689 Encounter for other specified antenatal screening: Secondary | ICD-10-CM

## 2020-02-20 DIAGNOSIS — Z3A18 18 weeks gestation of pregnancy: Secondary | ICD-10-CM

## 2020-02-20 DIAGNOSIS — Z3482 Encounter for supervision of other normal pregnancy, second trimester: Secondary | ICD-10-CM

## 2020-02-20 LAB — POCT URINALYSIS DIPSTICK OB
Bilirubin, UA: NEGATIVE
Blood, UA: NEGATIVE
Glucose, UA: NEGATIVE
Ketones, UA: NEGATIVE
Nitrite, UA: NEGATIVE
POC,PROTEIN,UA: NEGATIVE
Spec Grav, UA: 1.015 (ref 1.010–1.025)
Urobilinogen, UA: 0.2 E.U./dL
pH, UA: 7 (ref 5.0–8.0)

## 2020-02-20 MED ORDER — ONDANSETRON 8 MG PO TBDP: 8.0000 mg | ORAL_TABLET | Freq: Three times a day (TID) | ORAL | 1 refills | Status: DC | PRN

## 2020-02-20 NOTE — Progress Notes (Signed)
ROB-Feeling "crummy"; reports nausea with weekly vomiting, daily headaches and hip pain with sleeping. Discussed home treatment measures. Zofran refilled, see orders. Anticipatory guidance regarding course of prenatal care. Reviewed red flag symptoms and when to call. RTC x 2-3 weeks for ANATOMY SCAN and ROB or sooner if needed.

## 2020-02-20 NOTE — Patient Instructions (Signed)

## 2020-03-11 ENCOUNTER — Ambulatory Visit (INDEPENDENT_AMBULATORY_CARE_PROVIDER_SITE_OTHER): Payer: Commercial Managed Care - PPO | Admitting: Certified Nurse Midwife

## 2020-03-11 ENCOUNTER — Encounter: Payer: Self-pay | Admitting: Certified Nurse Midwife

## 2020-03-11 ENCOUNTER — Ambulatory Visit (INDEPENDENT_AMBULATORY_CARE_PROVIDER_SITE_OTHER): Payer: Commercial Managed Care - PPO

## 2020-03-11 ENCOUNTER — Other Ambulatory Visit: Payer: Self-pay

## 2020-03-11 VITALS — BP 109/71 | HR 70 | Wt 184.0 lb

## 2020-03-11 DIAGNOSIS — Z3482 Encounter for supervision of other normal pregnancy, second trimester: Secondary | ICD-10-CM

## 2020-03-11 DIAGNOSIS — Z3689 Encounter for other specified antenatal screening: Secondary | ICD-10-CM | POA: Diagnosis not present

## 2020-03-11 DIAGNOSIS — Z3A20 20 weeks gestation of pregnancy: Secondary | ICD-10-CM | POA: Diagnosis not present

## 2020-03-11 LAB — POCT URINALYSIS DIPSTICK OB
Bilirubin, UA: NEGATIVE
Blood, UA: NEGATIVE
Glucose, UA: NEGATIVE
Ketones, UA: NEGATIVE
Nitrite, UA: NEGATIVE
POC,PROTEIN,UA: NEGATIVE
Spec Grav, UA: 1.015 (ref 1.010–1.025)
Urobilinogen, UA: 0.2 E.U./dL
pH, UA: 7 (ref 5.0–8.0)

## 2020-03-11 NOTE — Patient Instructions (Signed)

## 2020-03-11 NOTE — Progress Notes (Signed)
ROB-Reports occasional nausea and vomiting, taking Zofran. Anatomy scan today complete and normal, see findings below. Anticipatory guidance regarding course of prenatal care. Reviewed red flag symptoms and when to call. RTC x 4 weeks for ROB or sooner if needed.    ULTRASOUND REPORT  Location: [facility] Date of Service: 03/11/2020   Indications:Anatomy Ultrasound Findings:  Singleton intrauterine pregnancy is visualized with FHR at 153 BPM. Biometrics give an (U/S) Gestational age of [redacted]w[redacted]d and an (U/S) EDD of 07/27/2020; this correlates with the clinically established Estimated Date of Delivery: 07/23/20  Fetal presentation is transverse.  EFW: 351 g ( 12 oz). Fetal Percentile  Placenta: anterior. Grade: 1 AFI: subjectively normal.  Anatomic survey is complete and normal; Gender - female.    Right Ovary is normal in appearance. Left Ovary is normal appearance. Survey of the adnexa demonstrates no adnexal masses. There is no free peritoneal fluid in the cul de sac.  Impression: 1. [redacted]w[redacted]d Viable Singleton Intrauterine pregnancy by U/S. 2. (U/S) EDD is consistent with Clinically established Estimated Date of Delivery: 07/23/20 . 3. Normal Anatomy Scan  Recommendations: 1.Clinical correlation with the patient's History and Physical Exam.

## 2020-04-01 ENCOUNTER — Other Ambulatory Visit: Payer: Self-pay

## 2020-04-01 ENCOUNTER — Ambulatory Visit (INDEPENDENT_AMBULATORY_CARE_PROVIDER_SITE_OTHER): Payer: Commercial Managed Care - PPO | Admitting: Certified Nurse Midwife

## 2020-04-01 ENCOUNTER — Encounter: Payer: Commercial Managed Care - PPO | Admitting: Certified Nurse Midwife

## 2020-04-01 VITALS — BP 106/79 | HR 87 | Wt 190.9 lb

## 2020-04-01 DIAGNOSIS — Z3482 Encounter for supervision of other normal pregnancy, second trimester: Secondary | ICD-10-CM

## 2020-04-01 DIAGNOSIS — Z3A23 23 weeks gestation of pregnancy: Secondary | ICD-10-CM

## 2020-04-01 LAB — POCT URINALYSIS DIPSTICK OB
Bilirubin, UA: NEGATIVE
Blood, UA: NEGATIVE
Glucose, UA: NEGATIVE
Ketones, UA: NEGATIVE
Leukocytes, UA: NEGATIVE
Nitrite, UA: NEGATIVE
POC,PROTEIN,UA: NEGATIVE
Spec Grav, UA: 1.015 (ref 1.010–1.025)
Urobilinogen, UA: 0.2 E.U./dL
pH, UA: 7 (ref 5.0–8.0)

## 2020-04-01 NOTE — Patient Instructions (Addendum)
WHAT OB PATIENTS CAN EXPECT   Confirmation of pregnancy and ultrasound ordered if medically indicated-[redacted] weeks gestation  New OB (NOB) intake with nurse and New OB (NOB) labs- [redacted] weeks gestation  New OB (NOB) physical examination with provider- 11/[redacted] weeks gestation  Flu vaccine-[redacted] weeks gestation  Anatomy scan-[redacted] weeks gestation  Glucose tolerance test, blood work to test for anemia, T-dap vaccine-[redacted] weeks gestation  Vaginal swabs/cultures-STD/Group B strep-[redacted] weeks gestation  Appointments every 4 weeks until 28 weeks  Every 2 weeks from 28 weeks until 36 weeks  Weekly visits from 36 weeks until delivery   Second Trimester of Pregnancy  The second trimester is from week 14 through week 27 (month 4 through 6). This is often the time in pregnancy that you feel your best. Often times, morning sickness has lessened or quit. You may have more energy, and you may get hungry more often. Your unborn baby is growing rapidly. At the end of the sixth month, he or she is about 9 inches long and weighs about 1 pounds. You will likely feel the baby move between 18 and 20 weeks of pregnancy. Follow these instructions at home: Medicines  Take over-the-counter and prescription medicines only as told by your doctor. Some medicines are safe and some medicines are not safe during pregnancy.  Take a prenatal vitamin that contains at least 600 micrograms (mcg) of folic acid.  If you have trouble pooping (constipation), take medicine that will make your stool soft (stool softener) if your doctor approves. Eating and drinking   Eat regular, healthy meals.  Avoid raw meat and uncooked cheese.  If you get low calcium from the food you eat, talk to your doctor about taking a daily calcium supplement.  Avoid foods that are high in fat and sugars, such as fried and sweet foods.  If you feel sick to your stomach (nauseous) or throw up (vomit): ? Eat 4 or 5 small meals a day instead of 3 large  meals. ? Try eating a few soda crackers. ? Drink liquids between meals instead of during meals.  To prevent constipation: ? Eat foods that are high in fiber, like fresh fruits and vegetables, whole grains, and beans. ? Drink enough fluids to keep your pee (urine) clear or pale yellow. Activity  Exercise only as told by your doctor. Stop exercising if you start to have cramps.  Do not exercise if it is too hot, too humid, or if you are in a place of great height (high altitude).  Avoid heavy lifting.  Wear low-heeled shoes. Sit and stand up straight.  You can continue to have sex unless your doctor tells you not to. Relieving pain and discomfort  Wear a good support bra if your breasts are tender.  Take warm water baths (sitz baths) to soothe pain or discomfort caused by hemorrhoids. Use hemorrhoid cream if your doctor approves.  Rest with your legs raised if you have leg cramps or low back pain.  If you develop puffy, bulging veins (varicose veins) in your legs: ? Wear support hose or compression stockings as told by your doctor. ? Raise (elevate) your feet for 15 minutes, 3-4 times a day. ? Limit salt in your food. Prenatal care  Write down your questions. Take them to your prenatal visits.  Keep all your prenatal visits as told by your doctor. This is important. Safety  Wear your seat belt when driving.  Make a list of emergency phone numbers, including numbers for family, friends,   the hospital, and police and fire departments. General instructions  Ask your doctor about the right foods to eat or for help finding a counselor, if you need these services.  Ask your doctor about local prenatal classes. Begin classes before month 6 of your pregnancy.  Do not use hot tubs, steam rooms, or saunas.  Do not douche or use tampons or scented sanitary pads.  Do not cross your legs for long periods of time.  Visit your dentist if you have not done so. Use a soft toothbrush  to brush your teeth. Floss gently.  Avoid all smoking, herbs, and alcohol. Avoid drugs that are not approved by your doctor.  Do not use any products that contain nicotine or tobacco, such as cigarettes and e-cigarettes. If you need help quitting, ask your doctor.  Avoid cat litter boxes and soil used by cats. These carry germs that can cause birth defects in the baby and can cause a loss of your baby (miscarriage) or stillbirth. Contact a doctor if:  You have mild cramps or pressure in your lower belly.  You have pain when you pee (urinate).  You have bad smelling fluid coming from your vagina.  You continue to feel sick to your stomach (nauseous), throw up (vomit), or have watery poop (diarrhea).  You have a nagging pain in your belly area.  You feel dizzy. Get help right away if:  You have a fever.  You are leaking fluid from your vagina.  You have spotting or bleeding from your vagina.  You have severe belly cramping or pain.  You lose or gain weight rapidly.  You have trouble catching your breath and have chest pain.  You notice sudden or extreme puffiness (swelling) of your face, hands, ankles, feet, or legs.  You have not felt the baby move in over an hour.  You have severe headaches that do not go away when you take medicine.  You have trouble seeing. Summary  The second trimester is from week 14 through week 27 (months 4 through 6). This is often the time in pregnancy that you feel your best.  To take care of yourself and your unborn baby, you will need to eat healthy meals, take medicines only if your doctor tells you to do so, and do activities that are safe for you and your baby.  Call your doctor if you get sick or if you notice anything unusual about your pregnancy. Also, call your doctor if you need help with the right food to eat, or if you want to know what activities are safe for you. This information is not intended to replace advice given to you by  your health care provider. Make sure you discuss any questions you have with your health care provider. Document Revised: 08/02/2018 Document Reviewed: 05/16/2016 Elsevier Patient Education  2020 Elsevier Inc.  

## 2020-04-01 NOTE — Progress Notes (Signed)
Pt present for routine prenatal visit. Believes she may have a yeast infection or UTI. States "something is off".

## 2020-04-01 NOTE — Progress Notes (Signed)
ROB-Reports frequent urination with little to no amounts, questions UTI; Will culture, see orders. Vaginal itching relieved with home monistat treatment. Planning holiday travel. Discussed precautions. Anticipatory guidance regarding course of prenatal care. Reviewed red flag symptoms and when to call. RTC x 4-5 weeks for 28 week labs, TDaP, and ROB or sooner if needed.

## 2020-04-24 NOTE — L&D Delivery Note (Addendum)
Delivery Note  [phone] Called in room to see patient, husband at nurses station stating patient is crowning.   Upon entering LDR 3, patient was standing at bedside with liveborn female infant resting on end of labor bed after doula assisted birth at 50. Delayed cord clamping and three (3) vessel cord double clamped and cut. Infant placed skin to skin with FOB. Receiving nurse at bedside within five (5) minutes of birth. APGARs: 8, 9. Weight pending.   IM Pitocin given, see MAR. Spontaneous delivery of intact placenta at 0700. Uterus firm. Rubra small. Periurethral laceration hemostatic, unrepaired. QBL: 275 ml. Counts correct x 2. Vault check completed, anesthesia none. Ice pack placed to right labial hematoma, will continue to monitor.   Initiate routine postpartum care and orders. Mom to postpartum.  Baby to Couplet care / Skin to Skin.  FOB and doula present at bedside and overjoyed with the birth of "Kyung Rudd".    Serafina Royals, CNM [facility], [facility] 07/13/2020, 7:35 AM

## 2020-04-28 ENCOUNTER — Telehealth: Payer: Self-pay

## 2020-04-28 NOTE — Telephone Encounter (Signed)
mychart message sent to patient- no visitors

## 2020-04-29 ENCOUNTER — Telehealth: Payer: Self-pay

## 2020-04-29 ENCOUNTER — Other Ambulatory Visit: Payer: Commercial Managed Care - PPO

## 2020-04-29 ENCOUNTER — Other Ambulatory Visit: Payer: Self-pay

## 2020-04-29 ENCOUNTER — Encounter: Payer: Commercial Managed Care - PPO | Admitting: Certified Nurse Midwife

## 2020-04-29 DIAGNOSIS — Z3A27 27 weeks gestation of pregnancy: Secondary | ICD-10-CM

## 2020-04-29 NOTE — Telephone Encounter (Signed)
mychart message sent to patient- re no visitor policy 

## 2020-04-30 LAB — CBC
Hematocrit: 37.3 % (ref 34.0–46.6)
Hemoglobin: 12.1 g/dL (ref 11.1–15.9)
MCH: 29.3 pg (ref 26.6–33.0)
MCHC: 32.4 g/dL (ref 31.5–35.7)
MCV: 90 fL (ref 79–97)
Platelets: 249 10*3/uL (ref 150–450)
RBC: 4.13 x10E6/uL (ref 3.77–5.28)
RDW: 12.5 % (ref 11.7–15.4)
WBC: 7.7 10*3/uL (ref 3.4–10.8)

## 2020-04-30 LAB — GLUCOSE, 1 HOUR GESTATIONAL: Gestational Diabetes Screen: 92 mg/dL (ref 65–139)

## 2020-04-30 LAB — RPR: RPR Ser Ql: NONREACTIVE

## 2020-05-03 ENCOUNTER — Encounter: Payer: Self-pay | Admitting: Certified Nurse Midwife

## 2020-05-03 ENCOUNTER — Other Ambulatory Visit: Payer: Self-pay

## 2020-05-03 ENCOUNTER — Ambulatory Visit (INDEPENDENT_AMBULATORY_CARE_PROVIDER_SITE_OTHER): Payer: Commercial Managed Care - PPO | Admitting: Certified Nurse Midwife

## 2020-05-03 VITALS — BP 120/68 | HR 89 | Wt 196.5 lb

## 2020-05-03 DIAGNOSIS — F32A Depression, unspecified: Secondary | ICD-10-CM

## 2020-05-03 DIAGNOSIS — Z3482 Encounter for supervision of other normal pregnancy, second trimester: Secondary | ICD-10-CM

## 2020-05-03 DIAGNOSIS — Z3A28 28 weeks gestation of pregnancy: Secondary | ICD-10-CM

## 2020-05-03 DIAGNOSIS — F419 Anxiety disorder, unspecified: Secondary | ICD-10-CM

## 2020-05-03 LAB — POCT URINALYSIS DIPSTICK OB
Bilirubin, UA: NEGATIVE
Blood, UA: NEGATIVE
Glucose, UA: NEGATIVE
Ketones, UA: NEGATIVE
Leukocytes, UA: NEGATIVE
Nitrite, UA: NEGATIVE
POC,PROTEIN,UA: NEGATIVE
Spec Grav, UA: 1.01 (ref 1.010–1.025)
Urobilinogen, UA: 0.2 E.U./dL
pH, UA: 6.5 (ref 5.0–8.0)

## 2020-05-03 NOTE — Progress Notes (Signed)
ROB-Patient tearful during appointment and reports stress due to mood changes and martial disagreements. PHQ-9: 15, GAD: 18. History of past Zoloft use, declines at this time. Open to weekly counseling, local options sent via MyChart. Using home treatment measures and chiropractic care to sooth several "miseries of pregnancy" with mild relief. TDaP declined. Blood transfusion signed. Breastfeeding teaching completes, see chart. 28 week handouts given. Anticipatory guidance regarding course of prenatal care. Reviewed red flag symptoms and when to call.   Depression screen [facility] 2/9 05/03/2020 06/13/2019 03/03/2019 11/11/2018 10/11/2018  Decreased Interest 1 0 0 0 1  Down, Depressed, Hopeless 3 0 0 1 1  PHQ - 2 Score 4 0 0 1 2  Altered sleeping 0 0 1 0 0  Tired, decreased energy 2 1 1 1 3   Change in appetite 2 0 0 0 0  Feeling bad or failure about yourself  3 0 0 1 1  Trouble concentrating 1 0 0 0 1  Moving slowly or fidgety/restless 1 0 0 0 0  Suicidal thoughts 2 0 0 0 0  PHQ-9 Score 15 1 2 3 7   Difficult doing work/chores Not difficult at all Not difficult at all Not difficult at all Not difficult at all Not difficult at all  Some recent data might be hidden    GAD 7 : Generalized Anxiety Score 05/03/2020 06/13/2019 03/03/2019 11/11/2018  Nervous, Anxious, on Edge 3 1 1 1   Control/stop worrying 3 0 3 1  Worry too much - different things 3 1 0 0  Trouble relaxing 3 0 1 0  Restless 1 0 0 0  Easily annoyed or irritable 2 1 0 0  Afraid - awful might happen 3 0 3 2  Total GAD 7 Score 18 3 8 4   Anxiety Difficulty Not difficult at all Not difficult at all Not difficult at all Not difficult at all     13/12/2018, Student[employer] 05/03/20 12:12 PM

## 2020-05-03 NOTE — Patient Instructions (Signed)
Round Ligament Pain  The round ligament is a cord of muscle and tissue that helps support the uterus. It can become a source of pain during pregnancy if it becomes stretched or twisted as the baby grows. The pain usually begins in the second trimester (13-28 weeks) of pregnancy, and it can come and go until the baby is delivered. It is not a serious problem, and it does not cause harm to the baby. Round ligament pain is usually a short, sharp, and pinching pain, but it can also be a dull, lingering, and aching pain. The pain is felt in the lower side of the abdomen or in the groin. It usually starts deep in the groin and moves up to the outside of the hip area. The pain may occur when you:  Suddenly change position, such as quickly going from a sitting to standing position.  Roll over in bed.  Cough or sneeze.  Do physical activity. Follow these instructions at home:  Watch your condition for any changes.  When the pain starts, relax. Then try any of these methods to help with the pain: ? Sitting down. ? Flexing your knees up to your abdomen. ? Lying on your side with one pillow under your abdomen and another pillow between your legs. ? Sitting in a warm bath for 15-20 minutes or until the pain goes away.  Take over-the-counter and prescription medicines only as told by your health care provider.  Move slowly when you sit down or stand up.  Avoid long walks if they cause pain.  Stop or reduce your physical activities if they cause pain.  Keep all follow-up visits as told by your health care provider. This is important.   Contact a health care provider if:  Your pain does not go away with treatment.  You feel pain in your back that you did not have before.  Your medicine is not helping. Get help right away if:  You have a fever or chills.  You develop uterine contractions.  You have vaginal bleeding.  You have nausea or vomiting.  You have diarrhea.  You have pain  when you urinate. Summary  Round ligament pain is felt in the lower abdomen or groin. It is usually a short, sharp, and pinching pain. It can also be a dull, lingering, and aching pain.  This pain usually begins in the second trimester (13-28 weeks). It occurs because the uterus is stretching with the growing baby, and it is not harmful to the baby.  You may notice the pain when you suddenly change position, when you cough or sneeze, or during physical activity.  Relaxing, flexing your knees to your abdomen, lying on one side, or taking a warm bath may help to get rid of the pain.  Get help from your health care provider if the pain does not go away or if you have vaginal bleeding, nausea, vomiting, diarrhea, or painful urination. This information is not intended to replace advice given to you by your health care provider. Make sure you discuss any questions you have with your health care provider. Document Revised: 09/26/2017 Document Reviewed: 09/26/2017 Elsevier Patient Education  2021 [employer].    Third Trimester of Pregnancy  The third trimester of pregnancy is from week 28 through week 40. This is also called months 7 through 9. This trimester is when your unborn baby (fetus) is growing very fast. At the end of the ninth month, the unborn baby is about 20 inches long.  It weighs about 6-10 pounds. Body changes during your third trimester Your body continues to go through many changes during this time. The changes vary and generally return to normal after the baby is born. Physical changes  Your weight will continue to increase. You may gain 25-35 pounds (11-16 kg) by the end of the pregnancy. If you are underweight, you may gain 28-40 lb (about 13-18 kg). If you are overweight, you may gain 15-25 lb (about 7-11 kg).  You may start to get stretch marks on your hips, belly (abdomen), and breasts.  Your breasts will continue to grow and may hurt. A yellow fluid (colostrum) may  leak from your breasts. This is the first milk you are making for your baby.  You may have changes in your hair.  Your belly button may stick out.  You may have more swelling in your hands, face, or ankles. Health changes  You may have heartburn.  You may have trouble pooping (constipation).  You may get hemorrhoids. These are swollen veins in the butt that can itch or get painful.  You may have swollen veins (varicose veins) in your legs.  You may have more body aches in the pelvis, back, or thighs.  You may have more tingling or numbness in your hands, arms, and legs. The skin on your belly may also feel numb.  You may feel short of breath as your womb (uterus) gets bigger. Other changes  You may pee (urinate) more often.  You may have more problems sleeping.  You may notice the unborn baby "dropping," or moving lower in your belly.  You may have more discharge coming from your vagina.  Your joints may feel loose, and you may have pain around your pelvic bone. Follow these instructions at home: Medicines  Take over-the-counter and prescription medicines only as told by your doctor. Some medicines are not safe during pregnancy.  Take a prenatal vitamin that contains at least 600 micrograms (mcg) of folic acid. Eating and drinking  Eat healthy meals that include: ? Fresh fruits and vegetables. ? Whole grains. ? Good sources of protein, such as meat, eggs, or tofu. ? Low-fat dairy products.  Avoid raw meat and unpasteurized juice, milk, and cheese. These carry germs that can harm you and your baby.  Eat 4 or 5 small meals rather than 3 large meals a day.  You may need to take these actions to prevent or treat trouble pooping: ? Drink enough fluids to keep your pee (urine) pale yellow. ? Eat foods that are high in fiber. These include beans, whole grains, and fresh fruits and vegetables. ? Limit foods that are high in fat and sugar. These include fried or sweet  foods. Activity  Exercise only as told by your doctor. Stop exercising if you start to have cramps in your womb.  Avoid heavy lifting.  Do not exercise if it is too hot or too humid, or if you are in a place of great height (high altitude).  If you choose to, you may have sex unless your doctor tells you not to. Relieving pain and discomfort  Take breaks often, and rest with your legs raised (elevated) if you have leg cramps or low back pain.  Take warm water baths (sitz baths) to soothe pain or discomfort caused by hemorrhoids. Use hemorrhoid cream if your doctor approves.  Wear a good support bra if your breasts are tender.  If you develop bulging, swollen veins in your legs: ? Wear  support hose as told by your doctor. ? Raise your feet for 15 minutes, 3-4 times a day. ? Limit salt in your food. Safety  Talk to your doctor before traveling far distances.  Do not use hot tubs, steam rooms, or saunas.  Wear your seat belt at all times when you are in a car.  Talk with your doctor if someone is hurting you or yelling at you a lot. Preparing for your baby's arrival To prepare for the arrival of your baby:  Take prenatal classes.  Visit the hospital and tour the maternity area.  Buy a rear-facing car seat. Learn how to install it in your car.  Prepare the baby's room. Take out all pillows and stuffed animals from the baby's crib. General instructions  Avoid cat litter boxes and soil used by cats. These carry germs that can cause harm to the baby and can cause a loss of your baby by miscarriage or stillbirth.  Do not douche or use tampons. Do not use scented sanitary pads.  Do not smoke or use any products that contain nicotine or tobacco. If you need help quitting, ask your doctor.  Do not drink alcohol.  Do not use herbal medicines, illegal drugs, or medicines that were not approved by your doctor. Chemicals in these products can affect your baby.  Keep all  follow-up visits. This is important. Where to find more information  American Pregnancy Association: americanpregnancy.org  [employer]: www.acog.org  Office on Women's Health: Might[employer]Rew[employer].co.nz Contact a doctor if:  You have a fever.  You have mild cramps or pressure in your lower belly.  You have a nagging pain in your belly area.  You vomit, or you have watery poop (diarrhea).  You have bad-smelling fluid coming from your vagina.  You have pain when you pee, or your pee smells bad.  You have a headache that does not go away when you take medicine.  You have changes in how you see, or you see spots in front of your eyes. Get help right away if:  Your water breaks.  You have regular contractions that are less than 5 minutes apart.  You are spotting or bleeding from your vagina.  You have very bad belly cramps or pain.  You have trouble breathing.  You have chest pain.  You faint.  You have not felt the baby move for the amount of time told by your doctor.  You have new or increased pain, swelling, or redness in an arm or leg. Summary  The third trimester is from week 28 through week 40 (months 7 through 9). This is the time when your unborn baby is growing very fast.  During this time, your discomfort may increase as you gain weight and as your baby grows.  Get ready for your baby to arrive by taking prenatal classes, buying a rear-facing car seat, and preparing the baby's room.  Get help right away if you are bleeding from your vagina, you have chest pain and trouble breathing, or you have not felt the baby move for the amount of time told by your doctor. This information is not intended to replace advice given to you by your health care provider. Make sure you discuss any questions you have with your health care provider. Document Revised: 09/17/2019 Document Reviewed: 07/24/2019 Elsevier Patient Education   2021 [employer].

## 2020-05-03 NOTE — Progress Notes (Signed)
I have seen, interviewed, and examined the patient in conjunction with the [employer] and affirm the diagnosis and management plan.   Gunnar Bulla, CNM [facility], [facility] 05/03/20 2:01 PM

## 2020-05-11 ENCOUNTER — Other Ambulatory Visit: Payer: Self-pay | Admitting: Certified Nurse Midwife

## 2020-05-11 DIAGNOSIS — Z8659 Personal history of other mental and behavioral disorders: Secondary | ICD-10-CM

## 2020-05-11 DIAGNOSIS — Z9189 Other specified personal risk factors, not elsewhere classified: Secondary | ICD-10-CM

## 2020-05-11 MED ORDER — SERTRALINE HCL 50 MG PO TABS: 50.0000 mg | ORAL_TABLET | Freq: Every day | ORAL | 1 refills | Status: DC

## 2020-05-11 MED ORDER — LORAZEPAM 1 MG PO TABS: 1.0000 mg | ORAL_TABLET | Freq: Every evening | ORAL | 0 refills | Status: DC | PRN

## 2020-05-11 NOTE — Progress Notes (Signed)
Rx Zoloft, see orders.    Serafina Royals, CNM [facility], [facility] 05/11/20 9:48 AM

## 2020-05-11 NOTE — Telephone Encounter (Signed)
Small prescription of Ativan submitted on behalf of provider Serafina Royals, CNM due to computer technical issues with prescribing.  Patient has been noting an increase in her anxiety and life stressors. Will also be referred to counseling, and initiated on Zoloft.    Hildred Laser, MD [facility]

## 2020-05-17 ENCOUNTER — Ambulatory Visit (INDEPENDENT_AMBULATORY_CARE_PROVIDER_SITE_OTHER): Payer: Commercial Managed Care - PPO | Admitting: Certified Nurse Midwife

## 2020-05-17 ENCOUNTER — Other Ambulatory Visit: Payer: Self-pay

## 2020-05-17 ENCOUNTER — Encounter: Payer: Self-pay | Admitting: Certified Nurse Midwife

## 2020-05-17 VITALS — BP 114/73 | HR 66 | Wt 190.1 lb

## 2020-05-17 DIAGNOSIS — Z3A30 30 weeks gestation of pregnancy: Secondary | ICD-10-CM

## 2020-05-17 DIAGNOSIS — Z3483 Encounter for supervision of other normal pregnancy, third trimester: Secondary | ICD-10-CM

## 2020-05-17 DIAGNOSIS — Z8659 Personal history of other mental and behavioral disorders: Secondary | ICD-10-CM

## 2020-05-17 DIAGNOSIS — F32A Depression, unspecified: Secondary | ICD-10-CM

## 2020-05-17 DIAGNOSIS — Z9189 Other specified personal risk factors, not elsewhere classified: Secondary | ICD-10-CM

## 2020-05-17 DIAGNOSIS — F419 Anxiety disorder, unspecified: Secondary | ICD-10-CM

## 2020-05-17 LAB — POCT URINALYSIS DIPSTICK OB
Bilirubin, UA: NEGATIVE
Blood, UA: NEGATIVE
Glucose, UA: NEGATIVE
Ketones, UA: NEGATIVE
Nitrite, UA: NEGATIVE
POC,PROTEIN,UA: NEGATIVE
Spec Grav, UA: 1.01 (ref 1.010–1.025)
Urobilinogen, UA: 0.2 E.U./dL
pH, UA: 8.5 — AB (ref 5.0–8.0)

## 2020-05-17 NOTE — Progress Notes (Signed)
ROB-Pt present for routine prenatal care. pt reportedwaterly thick discharged, Panic attacks, and  lower right abd pain. GAD-7=19.  PHQ-9=26.

## 2020-05-17 NOTE — Progress Notes (Signed)
I have seen, interviewed, and examined the patient in conjunction with the [employer] and affirm the diagnosis and management plan.   Gunnar Bulla, CNM [facility], [facility] 05/17/20 12:46 PM

## 2020-05-17 NOTE — Progress Notes (Signed)
ROB- Tearful due to insomnia, depression and increased panic attacks associated with chest tightness and one sided tingling. Started Zoloft last week. Seeing a counselor for marriage and self, next session today. Emotional support provided. Plan to increase Zoloft to 100 mg PO daily tomorrow.   Single episode of  watery discharge last week. Declines vaginal swab and pelvic exam today.   Treating persistent nausea with home treatment measures.   Anticipatory guidance regarding course of prenatal care. Reviewed red flag symptoms and when to call. ROB x 1 week or sooner if needed.  GAD 7 : Generalized Anxiety Score 05/17/2020 05/03/2020 06/13/2019 03/03/2019  Nervous, Anxious, on Edge 3 3 1 1   Control/stop worrying 3 3 0 3  Worry too much - different things 3 3 1  0  Trouble relaxing 3 3 0 1  Restless 1 1 0 0  Easily annoyed or irritable 3 2 1  0  Afraid - awful might happen 3 3 0 3  Total GAD 7 Score 19 18 3 8   Anxiety Difficulty Very difficult Not difficult at all Not difficult at all Not difficult at all    Depression screen [facility] 2/9 05/17/2020 05/03/2020 06/13/2019 03/03/2019 11/11/2018  Decreased Interest 3 1 0 0 0  Down, Depressed, Hopeless 3 3 0 0 1  PHQ - 2 Score 6 4 0 0 1  Altered sleeping 3 0 0 1 0  Tired, decreased energy 3 2 1 1 1   Change in appetite 3 2 0 0 0  Feeling bad or failure about yourself  3 3 0 0 1  Trouble concentrating 3 1 0 0 0  Moving slowly or fidgety/restless 3 1 0 0 0  Suicidal thoughts 2 2 0 0 0  PHQ-9 Score 26 15 1 2 3   Difficult doing work/chores - Not difficult at all Not difficult at all Not difficult at all Not difficult at all  Some recent data might be hidden    07/01/2020, 06/15/2019 [employer] 05/17/20 12:22 PM

## 2020-05-17 NOTE — Patient Instructions (Signed)
WHAT OB PATIENTS CAN EXPECT   Confirmation of pregnancy and ultrasound ordered if medically indicated-[redacted] weeks gestation  New OB (NOB) intake with nurse and New OB (NOB) labs- [redacted] weeks gestation  New OB (NOB) physical examination with provider- 11/[redacted] weeks gestation  Flu vaccine-[redacted] weeks gestation  Anatomy scan-[redacted] weeks gestation  Glucose tolerance test, blood work to test for anemia, T-dap vaccine-[redacted] weeks gestation  Vaginal swabs/cultures-STD/Group B strep-[redacted] weeks gestation  Appointments every 4 weeks until 28 weeks  Every 2 weeks from 28 weeks until 36 weeks  Weekly visits from 82 weeks until delivery  Third Trimester of Pregnancy  The third trimester of pregnancy is from week 28 through week 55. This is also called months 7 through 9. This trimester is when your unborn baby (fetus) is growing very fast. At the end of the ninth month, the unborn baby is about 20 inches long. It weighs about 6-10 pounds. Body changes during your third trimester Your body continues to go through many changes during this time. The changes vary and generally return to normal after the baby is born. Physical changes  Your weight will continue to increase. You may gain 25-35 pounds (11-16 kg) by the end of the pregnancy. If you are underweight, you may gain 28-40 lb (about 13-18 kg). If you are overweight, you may gain 15-25 lb (about 7-11 kg).  You may start to get stretch marks on your hips, belly (abdomen), and breasts.  Your breasts will continue to grow and may hurt. A yellow fluid (colostrum) may leak from your breasts. This is the first milk you are making for your baby.  You may have changes in your hair.  Your belly button may stick out.  You may have more swelling in your hands, face, or ankles. Health changes  You may have heartburn.  You may have trouble pooping (constipation).  You may get hemorrhoids. These are swollen veins in the butt that can itch or get painful.  You may  have swollen veins (varicose veins) in your legs.  You may have more body aches in the pelvis, back, or thighs.  You may have more tingling or numbness in your hands, arms, and legs. The skin on your belly may also feel numb.  You may feel short of breath as your womb (uterus) gets bigger. Other changes  You may pee (urinate) more often.  You may have more problems sleeping.  You may notice the unborn baby "dropping," or moving lower in your belly.  You may have more discharge coming from your vagina.  Your joints may feel loose, and you may have pain around your pelvic bone. Follow these instructions at home: Medicines  Take over-the-counter and prescription medicines only as told by your doctor. Some medicines are not safe during pregnancy.  Take a prenatal vitamin that contains at least 600 micrograms (mcg) of folic acid. Eating and drinking  Eat healthy meals that include: ? Fresh fruits and vegetables. ? Whole grains. ? Good sources of protein, such as meat, eggs, or tofu. ? Low-fat dairy products.  Avoid raw meat and unpasteurized juice, milk, and cheese. These carry germs that can harm you and your baby.  Eat 4 or 5 small meals rather than 3 large meals a day.  You may need to take these actions to prevent or treat trouble pooping: ? Drink enough fluids to keep your pee (urine) pale yellow. ? Eat foods that are high in fiber. These include beans, whole grains, and  fresh fruits and vegetables. ? Limit foods that are high in fat and sugar. These include fried or sweet foods. Activity  Exercise only as told by your doctor. Stop exercising if you start to have cramps in your womb.  Avoid heavy lifting.  Do not exercise if it is too hot or too humid, or if you are in a place of great height (high altitude).  If you choose to, you may have sex unless your doctor tells you not to. Relieving pain and discomfort  Take breaks often, and rest with your legs raised  (elevated) if you have leg cramps or low back pain.  Take warm water baths (sitz baths) to soothe pain or discomfort caused by hemorrhoids. Use hemorrhoid cream if your doctor approves.  Wear a good support bra if your breasts are tender.  If you develop bulging, swollen veins in your legs: ? Wear support hose as told by your doctor. ? Raise your feet for 15 minutes, 3-4 times a day. ? Limit salt in your food. Safety  Talk to your doctor before traveling far distances.  Do not use hot tubs, steam rooms, or saunas.  Wear your seat belt at all times when you are in a car.  Talk with your doctor if someone is hurting you or yelling at you a lot. Preparing for your baby's arrival To prepare for the arrival of your baby:  Take prenatal classes.  Visit the hospital and tour the maternity area.  Buy a rear-facing car seat. Learn how to install it in your car.  Prepare the baby's room. Take out all pillows and stuffed animals from the baby's crib. General instructions  Avoid cat litter boxes and soil used by cats. These carry germs that can cause harm to the baby and can cause a loss of your baby by miscarriage or stillbirth.  Do not douche or use tampons. Do not use scented sanitary pads.  Do not smoke or use any products that contain nicotine or tobacco. If you need help quitting, ask your doctor.  Do not drink alcohol.  Do not use herbal medicines, illegal drugs, or medicines that were not approved by your doctor. Chemicals in these products can affect your baby.  Keep all follow-up visits. This is important. Where to find more information  American Pregnancy Association: americanpregnancy.org  [employer]: www.acog.org  Office on Women's Health: KeywordPortfolios.com.br Contact a doctor if:  You have a fever.  You have mild cramps or pressure in your lower belly.  You have a nagging pain in your belly area.  You vomit, or  you have watery poop (diarrhea).  You have bad-smelling fluid coming from your vagina.  You have pain when you pee, or your pee smells bad.  You have a headache that does not go away when you take medicine.  You have changes in how you see, or you see spots in front of your eyes. Get help right away if:  Your water breaks.  You have regular contractions that are less than 5 minutes apart.  You are spotting or bleeding from your vagina.  You have very bad belly cramps or pain.  You have trouble breathing.  You have chest pain.  You faint.  You have not felt the baby move for the amount of time told by your doctor.  You have new or increased pain, swelling, or redness in an arm or leg. Summary  The third trimester is from week 28 through  week 40 (months 7 through 9). This is the time when your unborn baby is growing very fast.  During this time, your discomfort may increase as you gain weight and as your baby grows.  Get ready for your baby to arrive by taking prenatal classes, buying a rear-facing car seat, and preparing the baby's room.  Get help right away if you are bleeding from your vagina, you have chest pain and trouble breathing, or you have not felt the baby move for the amount of time told by your doctor. This information is not intended to replace advice given to you by your health care provider. Make sure you discuss any questions you have with your health care provider. Document Revised: 09/17/2019 Document Reviewed: 07/24/2019 Elsevier Patient Education  [place]. Common Medications Safe in Pregnancy  Acne:      Constipation:  Benzoyl Peroxide     Colace  Clindamycin      Dulcolax Suppository  Topica Erythromycin     Fibercon  Salicylic Acid      Metamucil         Miralax AVOID:        Senakot   Accutane    Cough:  Retin-A       Cough Drops  Tetracycline      Phenergan w/ Codeine if Rx  Minocycline      Robitussin (Plain &  DM)  Antibiotics:     Crabs/Lice:  Ceclor       RID  Cephalosporins    AVOID:  E-Mycins      Kwell  Keflex  Macrobid/Macrodantin   Diarrhea:  Penicillin      Kao-Pectate  Zithromax      Imodium AD         PUSH FLUIDS AVOID:       Cipro     Fever:  Tetracycline      Tylenol (Regular or Extra  Minocycline       Strength)  Levaquin      Extra Strength-Do not          Exceed 8 tabs/24 hrs Caffeine:        <276m/day (equiv. To 1 cup of coffee or  approx. 3 12 oz sodas)         Gas: Cold/Hayfever:       Gas-X  Benadryl      Mylicon  Claritin       Phazyme  **Claritin-D        Chlor-Trimeton    Headaches:  Dimetapp      ASA-Free Excedrin  Drixoral-Non-Drowsy     Cold Compress  Mucinex (Guaifenasin)     Tylenol (Regular or Extra  Sudafed/Sudafed-12 Hour     Strength)  **Sudafed PE Pseudoephedrine   Tylenol Cold & Sinus     Vicks Vapor Rub  Zyrtec  **AVOID if Problems With Blood Pressure         Heartburn: Avoid lying down for at least 1 hour after meals  Aciphex      Maalox     Rash:  Milk of Magnesia     Benadryl    Mylanta       1% Hydrocortisone Cream  Pepcid  Pepcid Complete   Sleep Aids:  Prevacid      Ambien   Prilosec       Benadryl  Rolaids       Chamomile Tea  Tums (Limit 4/day)     Unisom  Tylenol PM         Warm milk-add vanilla or  Hemorrhoids:       Sugar for taste  Anusol/Anusol H.C.  (RX: Analapram 2.5%)  Sugar Substitutes:  Hydrocortisone OTC     Ok in moderation  Preparation H      Tucks        Vaseline lotion applied to tissue with wiping    Herpes:     Throat:  Acyclovir      Oragel  Famvir  Valtrex     Vaccines:         Flu Shot Leg Cramps:       *Gardasil  Benadryl      Hepatitis A         Hepatitis B Nasal Spray:       Pneumovax  Saline Nasal Spray     Polio Booster         Tetanus Nausea:       Tuberculosis test or PPD  Vitamin B6 25 mg TID   AVOID:    Dramamine      *Gardasil  Emetrol       Live Poliovirus  Ginger  Root 250 mg QID    MMR (measles, mumps &  High Complex Carbs @ Bedtime    rebella)  Sea Bands-Accupressure    Varicella (Chickenpox)  Unisom 1/2 tab TID     *No known complications           If received before Pain:         Known pregnancy;   Darvocet       Resume series after  Lortab        Delivery  Percocet    Yeast:   Tramadol      Femstat  Tylenol 3      Gyne-lotrimin  Ultram       Monistat  Vicodin           MISC:         All Sunscreens           Hair Coloring/highlights          Insect Repellant's          (Including DEET)         Mystic Tans  

## 2020-05-24 ENCOUNTER — Ambulatory Visit (INDEPENDENT_AMBULATORY_CARE_PROVIDER_SITE_OTHER): Payer: Commercial Managed Care - PPO | Admitting: Certified Nurse Midwife

## 2020-05-24 ENCOUNTER — Other Ambulatory Visit: Payer: Self-pay

## 2020-05-24 ENCOUNTER — Encounter: Payer: Self-pay | Admitting: Certified Nurse Midwife

## 2020-05-24 VITALS — BP 103/71 | HR 78 | Wt 191.1 lb

## 2020-05-24 DIAGNOSIS — Z3A31 31 weeks gestation of pregnancy: Secondary | ICD-10-CM

## 2020-05-24 DIAGNOSIS — R350 Frequency of micturition: Secondary | ICD-10-CM

## 2020-05-24 DIAGNOSIS — Z3483 Encounter for supervision of other normal pregnancy, third trimester: Secondary | ICD-10-CM

## 2020-05-24 LAB — POCT URINALYSIS DIPSTICK OB
Bilirubin, UA: NEGATIVE
Blood, UA: NEGATIVE
Glucose, UA: NEGATIVE
Nitrite, UA: NEGATIVE
POC,PROTEIN,UA: NEGATIVE
Spec Grav, UA: 1.025 (ref 1.010–1.025)
Urobilinogen, UA: 0.2 E.U./dL
pH, UA: 6.5 (ref 5.0–8.0)

## 2020-05-24 MED ORDER — SERTRALINE HCL 100 MG PO TABS
100.0000 mg | ORAL_TABLET | Freq: Every day | ORAL | 1 refills | Status: DC
Start: 2020-05-24 — End: 2020-06-21

## 2020-05-24 NOTE — Progress Notes (Signed)
I have seen, interviewed, and examined the patient in conjunction with the [employer] and affirm the diagnosis and management plan.   Gunnar Bulla, CNM [facility], [facility] 05/24/20 10:57 AM

## 2020-05-24 NOTE — Progress Notes (Addendum)
ROB- Noticed improvement in mood this morning with the increased dose of Zoloft. Rx Zoloft, see orders. Continues with counseling 1-2 x a week, next session is today. GAD=20, PHQ-9 = 26. No plans for self harm. Volunteer doula information given. Reports urinary frequency; will send cultures, see orders. Anticipatory guidance regarding course of prenatal care. Reviewed red flag symptoms and when to call. ROB x 1 week or sooner if needed.   Juliann Pares, Student-MidWife  [employer] 05/24/20 10:29 AM     Depression screen [facility] 2/9 05/24/2020 05/24/2020 05/24/2020  Decreased Interest 3 3 3   Down, Depressed, Hopeless 3 3 3   PHQ - 2 Score 6 6 6   Altered sleeping 3 3 3   Tired, decreased energy 3 3 3   Change in appetite 3 3 3   Feeling bad or failure about yourself  3 3 3   Trouble concentrating 3 3 3   Moving slowly or fidgety/restless 3 3 3   Suicidal thoughts 2 2 2   PHQ-9 Score 26 26 26   Difficult doing work/chores Very difficult Very difficult Very difficult  Some recent data might be hidden    GAD 7 : Generalized Anxiety Score 05/24/2020 05/24/2020 05/17/2020 05/03/2020  Nervous, Anxious, on Edge 3 3 3 3   Control/stop worrying 3 3 3 3   Worry too much - different things 3 3 3 3   Trouble relaxing 3 3 3 3   Restless 2 2 1 1   Easily annoyed or irritable 3 3 3 2   Afraid - awful might happen 3 3 3 3   Total GAD 7 Score 20 20 19 18   Anxiety Difficulty Very difficult Very difficult Very difficult Not difficult at all

## 2020-05-24 NOTE — Progress Notes (Signed)
ROB-Pt present routine prenatal care. Pt stated that she was doing well other than having possible uti. PHQ-9=26; GAD-7=20.

## 2020-05-24 NOTE — Patient Instructions (Addendum)
Fetal Movement Counts Patient Name: ________________________________________________ Patient Due Date: ____________________  What is a fetal movement count? A fetal movement count is the number of times that you feel your baby move during a certain amount of time. This may also be called a fetal kick count. A fetal movement count is recommended for every pregnant woman. You may be asked to start counting fetal movements as early as week 28 of your pregnancy. Pay attention to when your baby is most active. You may notice your baby's sleep and wake cycles. You may also notice things that make your baby move more. You should do a fetal movement count:  When your baby is normally most active.  At the same time each day. A good time to count movements is while you are resting, after having something to eat and drink. How do I count fetal movements? 1. Find a quiet, comfortable area. Sit, or lie down on your side. 2. Write down the date, the start time and stop time, and the number of movements that you felt between those two times. Take this information with you to your health care visits. 3. Write down your start time when you feel the first movement. 4. Count kicks, flutters, swishes, rolls, and jabs. You should feel at least 10 movements. 5. You may stop counting after you have felt 10 movements, or if you have been counting for 2 hours. Write down the stop time. 6. If you do not feel 10 movements in 2 hours, contact your health care provider for further instructions. Your health care provider may want to do additional tests to assess your baby's well-being. Contact a health care provider if:  You feel fewer than 10 movements in 2 hours.  Your baby is not moving like he or she usually does. Date: ____________ Start time: ____________ Stop time: ____________ Movements: ____________ Date: ____________ Start time: ____________ Stop time: ____________ Movements: ____________ Date: ____________  Start time: ____________ Stop time: ____________ Movements: ____________ Date: ____________ Start time: ____________ Stop time: ____________ Movements: ____________ Date: ____________ Start time: ____________ Stop time: ____________ Movements: ____________ Date: ____________ Start time: ____________ Stop time: ____________ Movements: ____________ Date: ____________ Start time: ____________ Stop time: ____________ Movements: ____________ Date: ____________ Start time: ____________ Stop time: ____________ Movements: ____________ Date: ____________ Start time: ____________ Stop time: ____________ Movements: ____________ This information is not intended to replace advice given to you by your health care provider. Make sure you discuss any questions you have with your health care provider. Document Revised: 11/28/2018 Document Reviewed: 11/28/2018 Elsevier Patient Education  2021 [place].    Common Medications Safe in Pregnancy  Acne:      Constipation:  Benzoyl Peroxide     Colace  Clindamycin      Dulcolax Suppository  Topica Erythromycin     Fibercon  Salicylic Acid      Metamucil         Miralax AVOID:        Senakot   Accutane    Cough:  Retin-A       Cough Drops  Tetracycline      Phenergan w/ Codeine if Rx  Minocycline      Robitussin (Plain & DM)  Antibiotics:     Crabs/Lice:  Ceclor       RID  Cephalosporins    AVOID:  E-Mycins      Kwell  Keflex  Macrobid/Macrodantin   Diarrhea:  Penicillin      Kao-Pectate  Zithromax  Imodium AD         PUSH FLUIDS AVOID:       Cipro     Fever:  Tetracycline      Tylenol (Regular or Extra  Minocycline       Strength)  Levaquin      Extra Strength-Do not          Exceed 8 tabs/24 hrs Caffeine:        <226m/day (equiv. To 1 cup of coffee or  approx. 3 12 oz  sodas)         Gas: Cold/Hayfever:       Gas-X  Benadryl      Mylicon  Claritin       Phazyme  **Claritin-D        Chlor-Trimeton    Headaches:  Dimetapp      ASA-Free Excedrin  Drixoral-Non-Drowsy     Cold Compress  Mucinex (Guaifenasin)     Tylenol (Regular or Extra  Sudafed/Sudafed-12 Hour     Strength)  **Sudafed PE Pseudoephedrine   Tylenol Cold & Sinus     Vicks Vapor Rub  Zyrtec  **AVOID if Problems With Blood Pressure         Heartburn: Avoid lying down for at least 1 hour after meals  Aciphex      Maalox     Rash:  Milk of Magnesia     Benadryl    Mylanta       1% Hydrocortisone Cream  Pepcid  Pepcid Complete   Sleep Aids:  Prevacid      Ambien   Prilosec       Benadryl  Rolaids       Chamomile Tea  Tums (Limit 4/day)     Unisom         Tylenol PM         Warm milk-add vanilla or  Hemorrhoids:       Sugar for taste  Anusol/Anusol H.C.  (RX: Analapram 2.5%)  Sugar Substitutes:  Hydrocortisone OTC     Ok in moderation  Preparation H      Tucks        Vaseline lotion applied to tissue with wiping    Herpes:     Throat:  Acyclovir      Oragel  Famvir  Valtrex     Vaccines:         Flu Shot Leg Cramps:       *Gardasil  Benadryl      Hepatitis A         Hepatitis B Nasal Spray:       Pneumovax  Saline Nasal Spray     Polio Booster         Tetanus Nausea:       Tuberculosis test or PPD  Vitamin B6 25 mg TID   AVOID:    Dramamine      *Gardasil  Emetrol       Live Poliovirus  Ginger Root 250 mg QID    MMR (measles, mumps &  High Complex Carbs @ Bedtime    rebella)  Sea Bands-Accupressure    Varicella (Chickenpox)  Unisom 1/2 tab TID     *No known complications           If received before Pain:         Known pregnancy;   Darvocet       Resume series after  Lortab        Delivery  Percocet  Yeast:   Tramadol      Femstat  Tylenol 3      Gyne-lotrimin  Ultram       Monistat  Vicodin           MISC:         All Sunscreens           Hair  Coloring/highlights          Insect Repellant's          (Including DEET)         Mystic Tans

## 2020-05-26 LAB — URINE CULTURE: Organism ID, Bacteria: NO GROWTH

## 2020-06-03 ENCOUNTER — Other Ambulatory Visit: Payer: Self-pay

## 2020-06-03 ENCOUNTER — Ambulatory Visit (INDEPENDENT_AMBULATORY_CARE_PROVIDER_SITE_OTHER): Payer: Commercial Managed Care - PPO | Admitting: Certified Nurse Midwife

## 2020-06-03 VITALS — BP 116/64 | HR 60 | Wt 193.1 lb

## 2020-06-03 DIAGNOSIS — F419 Anxiety disorder, unspecified: Secondary | ICD-10-CM

## 2020-06-03 DIAGNOSIS — F32A Depression, unspecified: Secondary | ICD-10-CM

## 2020-06-03 DIAGNOSIS — Z3483 Encounter for supervision of other normal pregnancy, third trimester: Secondary | ICD-10-CM

## 2020-06-03 DIAGNOSIS — Z3A32 32 weeks gestation of pregnancy: Secondary | ICD-10-CM

## 2020-06-03 LAB — POCT URINALYSIS DIPSTICK OB
Bilirubin, UA: NEGATIVE
Blood, UA: NEGATIVE
Glucose, UA: NEGATIVE
Ketones, UA: NEGATIVE
Leukocytes, UA: NEGATIVE
Nitrite, UA: NEGATIVE
POC,PROTEIN,UA: NEGATIVE
Spec Grav, UA: >=1.03 — AB (ref 1.010–1.025)
Urobilinogen, UA: 0.2 E.U./dL
pH, UA: 8.5 — AB (ref 5.0–8.0)

## 2020-06-03 MED ORDER — SERTRALINE HCL 50 MG PO TABS: 50.0000 mg | ORAL_TABLET | Freq: Every day | ORAL | 1 refills | Status: DC

## 2020-06-03 NOTE — Progress Notes (Signed)
ROB-Reports pelvic pressure, nausea and vomiting. Taking mediations as prescribed. Attends chiropractic care. Discussed home treatment measures. PHQ-9: 25, GAD-7: 19; seeing counselor weekly. No SI/HI, increased negative thoughts. Rx Zoloft, see orders; will increase to 150 mg daily. Awaiting response from [employer]. Anticipatory guidance regarding course of prenatal care. Reviewed red flag symptoms and when to call. RTC x 2 weeks for ROB or sooner if needed.   GAD 7 : Generalized Anxiety Score 06/03/2020 05/24/2020 05/24/2020 05/17/2020  Nervous, Anxious, on Edge 3 3 3 3   Control/stop worrying 3 3 3 3   Worry too much - different things 3 3 3 3   Trouble relaxing 3 3 3 3   Restless 1 2 2 1   Easily annoyed or irritable 3 3 3 3   Afraid - awful might happen 3 3 3 3   Total GAD 7 Score 19 20 20 19   Anxiety Difficulty Very difficult Very difficult Very difficult Very difficult    Depression screen [facility] 2/9 06/03/2020 05/24/2020 05/24/2020 05/24/2020 05/17/2020  Decreased Interest 3 3 3 3 3   Down, Depressed, Hopeless 3 3 3 3 3   PHQ - 2 Score 6 6 6 6 6   Altered sleeping 3 3 3 3 3   Tired, decreased energy 3 3 3 3 3   Change in appetite 3 3 3 3 3   Feeling bad or failure about yourself  3 3 3 3 3   Trouble concentrating 3 3 3 3 3   Moving slowly or fidgety/restless 3 3 3 3 3   Suicidal thoughts 1 2 2 2 2   PHQ-9 Score 25 26 26 26 26   Difficult doing work/chores Very difficult Very difficult Very difficult Very difficult -  Some recent data might be hidden

## 2020-06-03 NOTE — Patient Instructions (Signed)
Fetal Movement Counts Patient Name: ________________________________________________ Patient Due Date: ____________________  What is a fetal movement count? A fetal movement count is the number of times that you feel your baby move during a certain amount of time. This may also be called a fetal kick count. A fetal movement count is recommended for every pregnant woman. You may be asked to start counting fetal movements as early as week 28 of your pregnancy. Pay attention to when your baby is most active. You may notice your baby's sleep and wake cycles. You may also notice things that make your baby move more. You should do a fetal movement count:  When your baby is normally most active.  At the same time each day. A good time to count movements is while you are resting, after having something to eat and drink. How do I count fetal movements? 1. Find a quiet, comfortable area. Sit, or lie down on your side. 2. Write down the date, the start time and stop time, and the number of movements that you felt between those two times. Take this information with you to your health care visits. 3. Write down your start time when you feel the first movement. 4. Count kicks, flutters, swishes, rolls, and jabs. You should feel at least 10 movements. 5. You may stop counting after you have felt 10 movements, or if you have been counting for 2 hours. Write down the stop time. 6. If you do not feel 10 movements in 2 hours, contact your health care provider for further instructions. Your health care provider may want to do additional tests to assess your baby's well-being. Contact a health care provider if:  You feel fewer than 10 movements in 2 hours.  Your baby is not moving like he or she usually does. Date: ____________ Start time: ____________ Stop time: ____________ Movements: ____________ Date: ____________ Start time: ____________ Stop time: ____________ Movements: ____________ Date: ____________  Start time: ____________ Stop time: ____________ Movements: ____________ Date: ____________ Start time: ____________ Stop time: ____________ Movements: ____________ Date: ____________ Start time: ____________ Stop time: ____________ Movements: ____________ Date: ____________ Start time: ____________ Stop time: ____________ Movements: ____________ Date: ____________ Start time: ____________ Stop time: ____________ Movements: ____________ Date: ____________ Start time: ____________ Stop time: ____________ Movements: ____________ Date: ____________ Start time: ____________ Stop time: ____________ Movements: ____________ This information is not intended to replace advice given to you by your health care provider. Make sure you discuss any questions you have with your health care provider. Document Revised: 11/28/2018 Document Reviewed: 11/28/2018 Elsevier Patient Education  2021 Elsevier Inc.  

## 2020-06-18 ENCOUNTER — Encounter: Payer: Commercial Managed Care - PPO | Admitting: Certified Nurse Midwife

## 2020-06-21 ENCOUNTER — Ambulatory Visit (INDEPENDENT_AMBULATORY_CARE_PROVIDER_SITE_OTHER): Payer: Commercial Managed Care - PPO | Admitting: Certified Nurse Midwife

## 2020-06-21 ENCOUNTER — Other Ambulatory Visit: Payer: Self-pay

## 2020-06-21 ENCOUNTER — Encounter: Payer: Self-pay | Admitting: Certified Nurse Midwife

## 2020-06-21 VITALS — BP 114/76 | HR 70 | Wt 194.1 lb

## 2020-06-21 DIAGNOSIS — Z3483 Encounter for supervision of other normal pregnancy, third trimester: Secondary | ICD-10-CM

## 2020-06-21 DIAGNOSIS — R110 Nausea: Secondary | ICD-10-CM

## 2020-06-21 DIAGNOSIS — F419 Anxiety disorder, unspecified: Secondary | ICD-10-CM

## 2020-06-21 DIAGNOSIS — Z3A35 35 weeks gestation of pregnancy: Secondary | ICD-10-CM

## 2020-06-21 DIAGNOSIS — F32A Depression, unspecified: Secondary | ICD-10-CM

## 2020-06-21 LAB — POCT URINALYSIS DIPSTICK OB
Bilirubin, UA: NEGATIVE
Blood, UA: NEGATIVE
Glucose, UA: NEGATIVE
Ketones, UA: NEGATIVE
Nitrite, UA: NEGATIVE
POC,PROTEIN,UA: NEGATIVE
Spec Grav, UA: 1.015 (ref 1.010–1.025)
Urobilinogen, UA: 0.2 E.U./dL
pH, UA: 8 (ref 5.0–8.0)

## 2020-06-21 MED ORDER — SERTRALINE HCL 100 MG PO TABS: 200.0000 mg | ORAL_TABLET | Freq: Every day | ORAL | 1 refills | Status: DC

## 2020-06-21 NOTE — Progress Notes (Signed)
I have seen, interviewed, and examined the patient in conjunction with the [employer] and affirm the diagnosis and management plan.   Gunnar Bulla, CNM [facility], [facility] 06/21/20 10:45 AM

## 2020-06-21 NOTE — Progress Notes (Addendum)
ROB- Notes several major life events occurring in the next two (2) weeks. Reports groin/ligamnet pain, insomnia, and  nausea without vomiting. Taking medications as prescribed and using home treatment measures including monthly Chiropractic care. Continues weekly therapy sessions. PHQ-9 =22, GAD-7 -18, with no plan to harm herself. Three sisters of balance handout given. Agrees to increase Zoloft to 200 mg PO daily, see orders. Anticipatory guidance regarding course of prenatal care. Reviewed red flag symptoms and when to call. RTC x 1 week for 36 week cultures and ROB or sooner if needed.   Depression screen [facility] 2/9 06/21/2020 06/03/2020 05/24/2020  Decreased Interest 3 3 3   Down, Depressed, Hopeless 3 3 3   PHQ - 2 Score 6 6 6   Altered sleeping 3 3 3   Tired, decreased energy 3 3 3   Change in appetite 3 3 3   Feeling bad or failure about yourself  2 3 3   Trouble concentrating 3 3 3   Moving slowly or fidgety/restless 1 3 3   Suicidal thoughts 1 1 2   PHQ-9 Score 22 25 26   Difficult doing work/chores Very difficult Very difficult Very difficult  Some recent data might be hidden    GAD 7 : Generalized Anxiety Score 06/21/2020 06/03/2020 05/24/2020 05/24/2020  Nervous, Anxious, on Edge 3 3 3 3   Control/stop worrying 3 3 3 3   Worry too much - different things 3 3 3 3   Trouble relaxing 3 3 3 3   Restless 1 1 2 2   Easily annoyed or irritable 2 3 3 3   Afraid - awful might happen 3 3 3 3   Total GAD 7 Score 18 19 20 20   Anxiety Difficulty Very difficult Very difficult Very difficult Very difficult     , [employer] 06/21/20 9:25 AM

## 2020-06-21 NOTE — Patient Instructions (Addendum)
Fetal Movement Counts Patient Name: ________________________________________________ Patient Due Date: ____________________  What is a fetal movement count? A fetal movement count is the number of times that you feel your baby move during a certain amount of time. This may also be called a fetal kick count. A fetal movement count is recommended for every pregnant woman. You may be asked to start counting fetal movements as early as week 28 of your pregnancy. Pay attention to when your baby is most active. You may notice your baby's sleep and wake cycles. You may also notice things that make your baby move more. You should do a fetal movement count:  When your baby is normally most active.  At the same time each day. A good time to count movements is while you are resting, after having something to eat and drink. How do I count fetal movements? 1. Find a quiet, comfortable area. Sit, or lie down on your side. 2. Write down the date, the start time and stop time, and the number of movements that you felt between those two times. Take this information with you to your health care visits. 3. Write down your start time when you feel the first movement. 4. Count kicks, flutters, swishes, rolls, and jabs. You should feel at least 10 movements. 5. You may stop counting after you have felt 10 movements, or if you have been counting for 2 hours. Write down the stop time. 6. If you do not feel 10 movements in 2 hours, contact your health care provider for further instructions. Your health care provider may want to do additional tests to assess your baby's well-being. Contact a health care provider if:  You feel fewer than 10 movements in 2 hours.  Your baby is not moving like he or she usually does. Date: ____________ Start time: ____________ Stop time: ____________ Movements: ____________ Date: ____________ Start time: ____________ Stop time: ____________ Movements: ____________ Date: ____________  Start time: ____________ Stop time: ____________ Movements: ____________ Date: ____________ Start time: ____________ Stop time: ____________ Movements: ____________ Date: ____________ Start time: ____________ Stop time: ____________ Movements: ____________ Date: ____________ Start time: ____________ Stop time: ____________ Movements: ____________ Date: ____________ Start time: ____________ Stop time: ____________ Movements: ____________ Date: ____________ Start time: ____________ Stop time: ____________ Movements: ____________ Date: ____________ Start time: ____________ Stop time: ____________ Movements: ____________ This information is not intended to replace advice given to you by your health care provider. Make sure you discuss any questions you have with your health care provider. Document Revised: 11/28/2018 Document Reviewed: 11/28/2018 Elsevier Patient Education  2021 [place] of Pregnancy  The third trimester of pregnancy is from week 28 through week 73. This is also called months 7 through 9. This trimester is when your unborn baby (fetus) is growing very fast. At the end of the ninth month, the unborn baby is about 20 inches long. It weighs about 6-10 pounds. Body changes during your third trimester Your body continues to go through many changes during this time. The changes vary and generally return to normal after the baby is born. Physical changes  Your weight will continue to increase. You may gain 25-35 pounds (11-16 kg) by the end of the pregnancy. If you are underweight, you may gain 28-40 lb (about 13-18 kg). If you are overweight, you may gain 15-25 lb (about 7-11 kg).  You may start to get stretch marks on your hips, belly (abdomen), and breasts.  Your breasts will continue to grow  and may hurt. A yellow fluid (colostrum) may leak from your breasts. This is the first milk you are making for your baby.  You may have changes in your hair.  Your  belly button may stick out.  You may have more swelling in your hands, face, or ankles. Health changes  You may have heartburn.  You may have trouble pooping (constipation).  You may get hemorrhoids. These are swollen veins in the butt that can itch or get painful.  You may have swollen veins (varicose veins) in your legs.  You may have more body aches in the pelvis, back, or thighs.  You may have more tingling or numbness in your hands, arms, and legs. The skin on your belly may also feel numb.  You may feel short of breath as your womb (uterus) gets bigger. Other changes  You may pee (urinate) more often.  You may have more problems sleeping.  You may notice the unborn baby "dropping," or moving lower in your belly.  You may have more discharge coming from your vagina.  Your joints may feel loose, and you may have pain around your pelvic bone. Follow these instructions at home: Medicines  Take over-the-counter and prescription medicines only as told by your doctor. Some medicines are not safe during pregnancy.  Take a prenatal vitamin that contains at least 600 micrograms (mcg) of folic acid. Eating and drinking  Eat healthy meals that include: ? Fresh fruits and vegetables. ? Whole grains. ? Good sources of protein, such as meat, eggs, or tofu. ? Low-fat dairy products.  Avoid raw meat and unpasteurized juice, milk, and cheese. These carry germs that can harm you and your baby.  Eat 4 or 5 small meals rather than 3 large meals a day.  You may need to take these actions to prevent or treat trouble pooping: ? Drink enough fluids to keep your pee (urine) pale yellow. ? Eat foods that are high in fiber. These include beans, whole grains, and fresh fruits and vegetables. ? Limit foods that are high in fat and sugar. These include fried or sweet foods. Activity  Exercise only as told by your doctor. Stop exercising if you start to have cramps in your womb.  Avoid  heavy lifting.  Do not exercise if it is too hot or too humid, or if you are in a place of great height (high altitude).  If you choose to, you may have sex unless your doctor tells you not to. Relieving pain and discomfort  Take breaks often, and rest with your legs raised (elevated) if you have leg cramps or low back pain.  Take warm water baths (sitz baths) to soothe pain or discomfort caused by hemorrhoids. Use hemorrhoid cream if your doctor approves.  Wear a good support bra if your breasts are tender.  If you develop bulging, swollen veins in your legs: ? Wear support hose as told by your doctor. ? Raise your feet for 15 minutes, 3-4 times a day. ? Limit salt in your food. Safety  Talk to your doctor before traveling far distances.  Do not use hot tubs, steam rooms, or saunas.  Wear your seat belt at all times when you are in a car.  Talk with your doctor if someone is hurting you or yelling at you a lot. Preparing for your baby's arrival To prepare for the arrival of your baby:  Take prenatal classes.  Visit the hospital and tour the maternity area.  Buy a  rear-facing car seat. Learn how to install it in your car.  Prepare the baby's room. Take out all pillows and stuffed animals from the baby's crib. General instructions  Avoid cat litter boxes and soil used by cats. These carry germs that can cause harm to the baby and can cause a loss of your baby by miscarriage or stillbirth.  Do not douche or use tampons. Do not use scented sanitary pads.  Do not smoke or use any products that contain nicotine or tobacco. If you need help quitting, ask your doctor.  Do not drink alcohol.  Do not use herbal medicines, illegal drugs, or medicines that were not approved by your doctor. Chemicals in these products can affect your baby.  Keep all follow-up visits. This is important. Where to find more information  American Pregnancy Association:  americanpregnancy.org  [employer]: www.acog.org  Office on Women's Health: KeywordPortfolios.com.br Contact a doctor if:  You have a fever.  You have mild cramps or pressure in your lower belly.  You have a nagging pain in your belly area.  You vomit, or you have watery poop (diarrhea).  You have bad-smelling fluid coming from your vagina.  You have pain when you pee, or your pee smells bad.  You have a headache that does not go away when you take medicine.  You have changes in how you see, or you see spots in front of your eyes. Get help right away if:  Your water breaks.  You have regular contractions that are less than 5 minutes apart.  You are spotting or bleeding from your vagina.  You have very bad belly cramps or pain.  You have trouble breathing.  You have chest pain.  You faint.  You have not felt the baby move for the amount of time told by your doctor.  You have new or increased pain, swelling, or redness in an arm or leg. Summary  The third trimester is from week 28 through week 40 (months 7 through 9). This is the time when your unborn baby is growing very fast.  During this time, your discomfort may increase as you gain weight and as your baby grows.  Get ready for your baby to arrive by taking prenatal classes, buying a rear-facing car seat, and preparing the baby's room.  Get help right away if you are bleeding from your vagina, you have chest pain and trouble breathing, or you have not felt the baby move for the amount of time told by your doctor. This information is not intended to replace advice given to you by your health care provider. Make sure you discuss any questions you have with your health care provider. Document Revised: 09/17/2019 Document Reviewed: 07/24/2019 Elsevier Patient Education  [place].   Common Medications Safe in Pregnancy  Acne:      Constipation:  Benzoyl  Peroxide     Colace  Clindamycin      Dulcolax Suppository  Topica Erythromycin     Fibercon  Salicylic Acid      Metamucil         Miralax AVOID:        Senakot   Accutane    Cough:  Retin-A       Cough Drops  Tetracycline      Phenergan w/ Codeine if Rx  Minocycline      Robitussin (Plain & DM)  Antibiotics:     Crabs/Lice:  Ceclor  RID  Cephalosporins    AVOID:  E-Mycins      Kwell  Keflex  Macrobid/Macrodantin   Diarrhea:  Penicillin      Kao-Pectate  Zithromax      Imodium AD         PUSH FLUIDS AVOID:       Cipro     Fever:  Tetracycline      Tylenol (Regular or Extra  Minocycline       Strength)  Levaquin      Extra Strength-Do not          Exceed 8 tabs/24 hrs Caffeine:        <211m/day (equiv. To 1 cup of coffee or  approx. 3 12 oz sodas)         Gas: Cold/Hayfever:       Gas-X  Benadryl      Mylicon  Claritin       Phazyme  **Claritin-D        Chlor-Trimeton    Headaches:  Dimetapp      ASA-Free Excedrin  Drixoral-Non-Drowsy     Cold Compress  Mucinex (Guaifenasin)     Tylenol (Regular or Extra  Sudafed/Sudafed-12 Hour     Strength)  **Sudafed PE Pseudoephedrine   Tylenol Cold & Sinus     Vicks Vapor Rub  Zyrtec  **AVOID if Problems With Blood Pressure         Heartburn: Avoid lying down for at least 1 hour after meals  Aciphex      Maalox     Rash:  Milk of Magnesia     Benadryl    Mylanta       1% Hydrocortisone Cream  Pepcid  Pepcid Complete   Sleep Aids:  Prevacid      Ambien   Prilosec       Benadryl  Rolaids       Chamomile Tea  Tums (Limit 4/day)     Unisom         Tylenol PM         Warm milk-add vanilla or  Hemorrhoids:       Sugar for taste  Anusol/Anusol H.C.  (RX: Analapram 2.5%)  Sugar Substitutes:  Hydrocortisone OTC     Ok in moderation  Preparation H      Tucks        Vaseline lotion applied to tissue with wiping    Herpes:     Throat:  Acyclovir      Oragel  Famvir  Valtrex     Vaccines:         Flu  Shot Leg Cramps:       *Gardasil  Benadryl      Hepatitis A         Hepatitis B Nasal Spray:       Pneumovax  Saline Nasal Spray     Polio Booster         Tetanus Nausea:       Tuberculosis test or PPD  Vitamin B6 25 mg TID   AVOID:    Dramamine      *Gardasil  Emetrol       Live Poliovirus  Ginger Root 250 mg QID    MMR (measles, mumps &  High Complex Carbs @ Bedtime    rebella)  Sea Bands-Accupressure    Varicella (Chickenpox)  Unisom 1/2 tab TID     *No known complications           If  received before Pain:         Known pregnancy;   Darvocet       Resume series after  Lortab        Delivery  Percocet    Yeast:   Tramadol      Femstat  Tylenol 3      Gyne-lotrimin  Ultram       Monistat  Vicodin           MISC:         All Sunscreens           Hair Coloring/highlights          Insect Repellant's          (Including DEET)         Mystic Tans

## 2020-06-21 NOTE — Progress Notes (Signed)
OB-Pt present for routine prenatal care. Pt c/o side abd pain. PHQ-9=22  GAD-7= 18.

## 2020-06-23 ENCOUNTER — Observation Stay
Admission: EM | Admit: 2020-06-23 | Discharge: 2020-06-24 | Disposition: A | Discharge status: Home / Self Care | Payer: Commercial Managed Care - PPO | Attending: Certified Nurse Midwife | Admitting: Certified Nurse Midwife

## 2020-06-23 ENCOUNTER — Encounter: Payer: Self-pay | Admitting: Obstetrics and Gynecology

## 2020-06-23 ENCOUNTER — Other Ambulatory Visit: Payer: Self-pay

## 2020-06-23 DIAGNOSIS — O26893 Other specified pregnancy related conditions, third trimester: Secondary | ICD-10-CM | POA: Diagnosis not present

## 2020-06-23 DIAGNOSIS — R109 Unspecified abdominal pain: Secondary | ICD-10-CM | POA: Insufficient documentation

## 2020-06-23 DIAGNOSIS — Z20822 Contact with and (suspected) exposure to covid-19: Secondary | ICD-10-CM | POA: Insufficient documentation

## 2020-06-23 DIAGNOSIS — O26899 Other specified pregnancy related conditions, unspecified trimester: Secondary | ICD-10-CM | POA: Diagnosis present

## 2020-06-23 DIAGNOSIS — Z3A35 35 weeks gestation of pregnancy: Secondary | ICD-10-CM | POA: Diagnosis not present

## 2020-06-23 DIAGNOSIS — O219 Vomiting of pregnancy, unspecified: Secondary | ICD-10-CM | POA: Diagnosis not present

## 2020-06-23 MED ORDER — LACTATED RINGERS IV SOLN: INTRAVENOUS | Status: DC

## 2020-06-23 MED ORDER — ONDANSETRON HCL 4 MG/2ML IJ SOLN
4.0000 mg | Freq: Four times a day (QID) | INTRAMUSCULAR | Status: DC | PRN
  Administered 2020-06-23 – 2020-06-24 (×2): 4 mg via INTRAVENOUS
  Filled 2020-06-23 (×2): qty 2

## 2020-06-23 MED ORDER — DEXTROSE 5 % IN LACTATED RINGERS IV BOLUS
1000.0000 mL | Freq: Once | INTRAVENOUS | Status: AC
  Administered 2020-06-23: 1000 mL via INTRAVENOUS

## 2020-06-23 MED ORDER — ACETAMINOPHEN 500 MG PO TABS
1000.0000 mg | ORAL_TABLET | Freq: Four times a day (QID) | ORAL | Status: DC | PRN
  Administered 2020-06-24: 1000 mg via ORAL
  Filled 2020-06-23: qty 2

## 2020-06-23 NOTE — OB Triage Note (Signed)
Pt is a 30y/o G3P1 at [redacted]w[redacted]d with c/o upper abdominal pain and some intermittent lower abdominal pain. Pt states abdominal pain at 9pm last night and has been vomiting since 4 pm. Pt states they do not feel like contractions. Pt states +FM. Pt denies LOF, CTX and VB. Monitors applied and assessing. Initial FHT 160.

## 2020-06-24 DIAGNOSIS — Z3A35 35 weeks gestation of pregnancy: Secondary | ICD-10-CM | POA: Diagnosis not present

## 2020-06-24 DIAGNOSIS — R109 Unspecified abdominal pain: Secondary | ICD-10-CM | POA: Diagnosis not present

## 2020-06-24 DIAGNOSIS — R112 Nausea with vomiting, unspecified: Secondary | ICD-10-CM

## 2020-06-24 DIAGNOSIS — O26893 Other specified pregnancy related conditions, third trimester: Secondary | ICD-10-CM | POA: Diagnosis not present

## 2020-06-24 LAB — COMPREHENSIVE METABOLIC PANEL
ALT: 9 U/L (ref 0–44)
AST: 19 U/L (ref 15–41)
Albumin: 3.5 g/dL (ref 3.5–5.0)
Alkaline Phosphatase: 72 U/L (ref 38–126)
Anion gap: 11 (ref 5–15)
BUN: 8 mg/dL (ref 6–20)
CO2: 20 mmol/L — ABNORMAL LOW (ref 22–32)
Calcium: 8.9 mg/dL (ref 8.9–10.3)
Chloride: 104 mmol/L (ref 98–111)
Creatinine, Ser: 0.56 mg/dL (ref 0.44–1.00)
GFR, Estimated: >60 mL/min (ref >60)
Glucose, Bld: 101 mg/dL — ABNORMAL HIGH (ref 70–99)
Potassium: 3.7 mmol/L (ref 3.5–5.1)
Sodium: 135 mmol/L (ref 135–145)
Total Bilirubin: 1.1 mg/dL (ref 0.3–1.2)
Total Protein: 7.4 g/dL (ref 6.5–8.1)

## 2020-06-24 LAB — TYPE AND SCREEN
ABO/RH(D): A POS
Antibody Screen: NEGATIVE

## 2020-06-24 LAB — CBC
HCT: 40 % (ref 36.0–46.0)
Hemoglobin: 13.7 g/dL (ref 12.0–15.0)
MCH: 29.5 pg (ref 26.0–34.0)
MCHC: 34.3 g/dL (ref 30.0–36.0)
MCV: 86 fL (ref 80.0–100.0)
Platelets: 218 10*3/uL (ref 150–400)
RBC: 4.65 MIL/uL (ref 3.87–5.11)
RDW: 12.4 % (ref 11.5–15.5)
WBC: 10.3 10*3/uL (ref 4.0–10.5)
nRBC: 0 % (ref 0.0–0.2)

## 2020-06-24 LAB — URINALYSIS, COMPLETE (UACMP) WITH MICROSCOPIC
Bacteria, UA: NONE SEEN
Bilirubin Urine: NEGATIVE
Glucose, UA: NEGATIVE mg/dL
Hgb urine dipstick: NEGATIVE
Ketones, ur: 80 mg/dL — AB
Nitrite: NEGATIVE
Protein, ur: 30 mg/dL — AB
Specific Gravity, Urine: 1.025 (ref 1.005–1.030)
pH: 5 (ref 5.0–8.0)

## 2020-06-24 LAB — RESP PANEL BY RT-PCR (FLU A&B, COVID) ARPGX2
Influenza A by PCR: NEGATIVE
Influenza B by PCR: NEGATIVE
SARS Coronavirus 2 by RT PCR: NEGATIVE

## 2020-06-24 LAB — GROUP B STREP BY PCR: Group B strep by PCR: POSITIVE — AB

## 2020-06-24 LAB — RPR: RPR Ser Ql: NONREACTIVE

## 2020-06-24 MED ORDER — FAMOTIDINE 20 MG PO TABS
20.0000 mg | ORAL_TABLET | Freq: Every day | ORAL | Status: DC
  Administered 2020-06-24: 20 mg via ORAL
  Filled 2020-06-24: qty 1

## 2020-06-24 MED ORDER — FAMOTIDINE 20 MG PO TABS
20.0000 mg | ORAL_TABLET | Freq: Every day | ORAL | 2 refills | Status: DC
Start: 2020-06-24 — End: 2023-03-15

## 2020-06-24 MED ORDER — ACETAMINOPHEN 500 MG PO TABS: 1000.0000 mg | ORAL_TABLET | Freq: Four times a day (QID) | ORAL | 0 refills | Status: DC | PRN

## 2020-06-24 MED ORDER — METOCLOPRAMIDE HCL 5 MG/ML IJ SOLN
10.0000 mg | Freq: Four times a day (QID) | INTRAMUSCULAR | Status: DC | PRN
  Administered 2020-06-24 (×2): 10 mg via INTRAVENOUS
  Filled 2020-06-24 (×5): qty 2

## 2020-06-24 MED ORDER — FAMOTIDINE IN NACL 20-0.9 MG/50ML-% IV SOLN
20.0000 mg | Freq: Once | INTRAVENOUS | Status: AC
  Administered 2020-06-24: 20 mg via INTRAVENOUS
  Filled 2020-06-24: qty 50

## 2020-06-24 NOTE — Discharge Instructions (Signed)
Preterm Labor Pregnancy normally lasts 39-41 weeks. Preterm labor is when labor starts before you have been pregnant for 37 weeks. Babies who are born too early may have problems with blood sugar, body temperature, heart, and breathing. These problems may be very serious in babies who are born before 34 weeks of pregnancy. What are the causes? The cause of this condition is not known. What increases the risk? You are more likely to have preterm labor if:  You have medical problems, now or in the past.  You have problems now or in your past pregnancies.  You have lifestyle problems. Medical history  You have problems of the womb (uterus).  You have an infection, including infections you get from sex.  You have problems that do not go away, such as: ? Blood clots. ? High blood pressure. ? High blood sugar.  You have low body weight or too much body weight. Present and past pregnancies  You have had preterm labor before.  You are pregnant with two babies or more.  You have a condition in which the placenta covers your cervix.  You waited less than 6 months between giving birth and becoming pregnant again.  Your unborn baby has some problems.  You have bleeding from your vagina.  You became pregnant by a method called IVF. Lifestyle  You smoke.  You drink alcohol.  You use drugs.  You have stress.  You have abuse in your home.  You come in contact with chemicals that harm the body (pollutants). Other factors  You are younger than 17 years or older than 35 years. What are the signs or symptoms? Symptoms of this condition include:  Cramps. The cramps may feel like cramps from a period.  You may have watery poop (diarrhea).  Pain in the belly (abdomen).  Pain in the lower back.  Regular contractions. It may feel like your belly is getting tighter.  Pressure in the lower belly.  More fluid leaking from the vagina. The fluid may be watery or  bloody.  Water breaking. How is this treated? Treatment for this condition depends on your health, the health of your baby, and how old your pregnancy is. It may include:  Taking medicines, such as: ? Hormone medicines. ? Medicines to stop contractions. ? Medicines to help mature the baby's lungs. ? Medicines to prevent your baby from getting cerebral palsy.  Bed rest. If the labor happens before 34 weeks of pregnancy, you may need to stay in the hospital.  Delivering the baby. Follow these instructions at home:  Do not use any products that contain nicotine or tobacco, such as cigarettes, e-cigarettes, and chewing tobacco. If you need help quitting, ask your doctor.  Do not drink alcohol.  Take over-the-counter and prescription medicines only as told by your doctor.  Rest as told by your doctor.  Return to your activities as told by your doctor. Ask your doctor what activities are safe for you.  Keep all follow-up visits as told by your doctor. This is important.   How is this prevented? To have a healthy pregnancy:  Do not use street drugs.  Do not use any medicines unless you ask your doctor if they are safe for you.  Talk with your doctor before taking any herbal supplements.  Make sure you gain enough weight.  Watch for infection. If you think you might have an infection, get it checked right away. Symptoms of infection may include: ? Fever. ? Vaginal discharge. ?   Pain or burning when you pee. ? Needing to pee urgently. ? Needing to pee often. ? Peeing small amounts often. ? Blood in your pee. ? Pee that smells bad or unusual.  Tell your doctor if you have gone into preterm labor before. Contact a doctor if:  You think you are going into preterm labor.  You have symptoms of preterm labor.  You have symptoms of infection. Get help right away if:  You are having painful contractions every 5 minutes or less.  Your water breaks. Summary  Preterm labor  is labor that starts before you reach 37 weeks of pregnancy.  Your baby may have problems if delivered early.  The cause of preterm labor is not known. Having problems of the womb (uterus), an infection, or bleeding during pregnancy increases the risk.  Contact a doctor if you have signs or symptoms of preterm labor. This information is not intended to replace advice given to you by your health care provider. Make sure you discuss any questions you have with your health care provider. Document Revised: 05/13/2019 Document Reviewed: 05/13/2019 Elsevier Patient Education  2021 Else[employer] Inc.   Fetal Movement Counts Patient Name: ________________________________________________ Patient Due Date: ____________________  What is a fetal movement count? A fetal movement count is the number of times that you feel your baby move during a certain amount of time. This may also be called a fetal kick count. A fetal movement count is recommended for every pregnant woman. You may be asked to start counting fetal movements as early as week 28 of your pregnancy. Pay attention to when your baby is most active. You may notice your baby's sleep and wake cycles. You may also notice things that make your baby move more. You should do a fetal movement count:  When your baby is normally most active.  At the same time each day. A good time to count movements is while you are resting, after having something to eat and drink. How do I count fetal movements? 1. Find a quiet, comfortable area. Sit, or lie down on your side. 2. Write down the date, the start time and stop time, and the number of movements that you felt between those two times. Take this information with you to your health care visits. 3. Write down your start time when you feel the first movement. 4. Count kicks, flutters, swishes, rolls, and jabs. You should feel at least 10 movements. 5. You may stop counting after you have felt 10 movements, or if  you have been counting for 2 hours. Write down the stop time. 6. If you do not feel 10 movements in 2 hours, contact your health care provider for further instructions. Your health care provider may want to do additional tests to assess your baby's well-being. Contact a health care provider if:  You feel fewer than 10 movements in 2 hours.  Your baby is not moving like he or she usually does. Date: ____________ Start time: ____________ Stop time: ____________ Movements: ____________ Date: ____________ Start time: ____________ Stop time: ____________ Movements: ____________ Date: ____________ Start time: ____________ Stop time: ____________ Movements: ____________ Date: ____________ Start time: ____________ Stop time: ____________ Movements: ____________ Date: ____________ Start time: ____________ Stop time: ____________ Movements: ____________ Date: ____________ Start time: ____________ Stop time: ____________ Movements: ____________ Date: ____________ Start time: ____________ Stop time: ____________ Movements: ____________ Date: ____________ Start time: ____________ Stop time: ____________ Movements: ____________ Date: ____________ Start time: ____________ Stop time: ____________ Movements: ____________ This information is  not intended to replace advice given to you by your health care provider. Make sure you discuss any questions you have with your health care provider. Document Revised: 11/28/2018 Document Reviewed: 11/28/2018 Elsevier Patient Education  2021 Elsevier Inc.   Abdominal Pain During Pregnancy Belly (abdominal) pain is common during pregnancy. There are many possible causes. Some causes are more serious than others. Sometimes the cause is not known. Always tell your doctor if you have belly pain. Follow these instructions at home:  Do not have sex or put anything in your vagina until your pain goes away completely.  Get plenty of rest until your pain gets better.  Drink  enough fluid to keep your pee (urine) pale yellow.  Take over-the-counter and prescription medicines only as told by your doctor.  Keep all follow-up visits.   Contact a doctor if:  You keep having pain after resting.  Your pain gets worse after resting.  You have lower belly pain that: ? Comes and goes at regular times. ? Spreads to your back. ? Feels like menstrual cramps.  You have pain or burning when you pee (urinate). Get help right away if:  You have a fever or chills.  You feel like it is hard to breathe.  You have bleeding from your vagina.  You are leaking fluid or tissue from your vagina.  You vomit for more than 24 hours.  You have watery poop (diarrhea) for more than 24 hours.  Your baby is moving less than usual.  You feel very weak or faint.  You have very bad pain in your upper belly. Summary  Belly pain is common during pregnancy. There are many possible causes.  If you have belly pain during pregnancy, tell your doctor right away.  Keep all follow-up visits. This information is not intended to replace advice given to you by your health care provider. Make sure you discuss any questions you have with your health care provider. Document Revised: 12/23/2019 Document Reviewed: 12/23/2019 Elsevier Patient Education  2021 [employer].

## 2020-06-24 NOTE — Progress Notes (Signed)
RN at bedside. Pt resting comfortably on her side and states " I'm not really hurting anymore." Pt rates stomach pain 1/10 scale and states she has not had any more episodes of emesis. Vitals WDL and menu given for patient to order breakfast. Pt denies any needs at this time and RN gave her the call bell and instructed her to call if needed.
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
## 2020-06-24 NOTE — Progress Notes (Signed)
  Regina Gray is a 30 y.o. G3P1011 at [redacted]w[redacted]d, here for observation for management of nausea, vomiting, and abdominal pain in pregnancy. Estimated Date of Delivery: 07/23/20  Subjective:  Patient states she still feels nausea, has eaten with no emesis. Reports some chest, back, and pelvic pain/soreness. Patient says she is ready to go home.  Objective: BP (!) 101/54 (BP Location: Right Arm)   Pulse 99   Temp 97.8 F (36.6 C) (Oral)   Resp 16   LMP 10/07/2019 (Within Days)  I/O last 3 completed shifts: In: 937.7 [P.O.:120; I.V.:770.8; IV Piggyback:46.9] Out: -  No intake/output data recorded.  FHT:  FHR: 155 bpm, variability: moderate,  accelerations:  Present,  decelerations:  Absent UC:   irregular, 1-2 noted SVE:   Dilation: 1 Exam by:: Willodean Rosenthal CNM  Labs: Lab Results  Component Value Date   WBC 10.3 06/23/2020   HGB 13.7 06/23/2020   HCT 40.0 06/23/2020   MCV 86.0 06/23/2020   PLT 218 06/23/2020    Assessment: Observation for Nausea, Vomiting, and abdominal pain in Pregnancy   Plan:  Discharge to home today  Rx for Pepcid, see orders  Reviewed red flag symptoms and when to call.  Patient RTC on Monday for ROB or sooner if needed    Juliann Pares, [employer] 06/24/2020, 12:35 PM
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
## 2020-06-24 NOTE — Progress Notes (Signed)
RN at bedside. Pt still sleeping. Pt informed again to order breakfast. Pt states she has had crackers and water with no emesis but states she still has some nausea but it is nothing like before. Pt denies any needs at this time.

## 2020-06-25 LAB — URINE CULTURE: Culture: <10000 — AB

## 2020-06-28 ENCOUNTER — Other Ambulatory Visit (HOSPITAL_COMMUNITY)
Admission: RE | Admit: 2020-06-28 | Discharge: 2020-06-28 | Disposition: A | Discharge status: Home / Self Care | Payer: Commercial Managed Care - PPO | Source: Ambulatory Visit | Attending: Certified Nurse Midwife | Admitting: Certified Nurse Midwife

## 2020-06-28 ENCOUNTER — Ambulatory Visit (INDEPENDENT_AMBULATORY_CARE_PROVIDER_SITE_OTHER): Payer: Commercial Managed Care - PPO | Admitting: Certified Nurse Midwife

## 2020-06-28 ENCOUNTER — Other Ambulatory Visit: Payer: Self-pay

## 2020-06-28 VITALS — BP 114/73 | HR 74 | Wt 198.7 lb

## 2020-06-28 DIAGNOSIS — Z3A36 36 weeks gestation of pregnancy: Secondary | ICD-10-CM | POA: Diagnosis present

## 2020-06-28 DIAGNOSIS — Z3483 Encounter for supervision of other normal pregnancy, third trimester: Secondary | ICD-10-CM | POA: Diagnosis present

## 2020-06-28 LAB — POCT URINALYSIS DIPSTICK OB
Bilirubin, UA: NEGATIVE
Blood, UA: NEGATIVE
Glucose, UA: NEGATIVE
Ketones, UA: NEGATIVE
Nitrite, UA: NEGATIVE
POC,PROTEIN,UA: NEGATIVE
Spec Grav, UA: 1.01 (ref 1.010–1.025)
Urobilinogen, UA: 0.2 E.U./dL
pH, UA: 7 (ref 5.0–8.0)

## 2020-06-28 NOTE — Progress Notes (Signed)
ROB- Reports Nausea, only vomiting once a day, patient is taking all prescribed medications, see discharge note from hospital. GBS swab collected at hospital. GC/CH swab collected, desires yeast and BV testing as well, see orders. Declined depression and anxiety screening this visit. Continues to take zoloft as prescribed and therapy,  states feeling some improvement. Anticipatory guidance regarding course of prenatal care. Reviewed red flag symptoms and when to call. RTC x 1 week for ROB and x 2 weeks for ROB with JML or sooner if needed.  Juliann Pares, [employer] 06/28/20 9:40 AM

## 2020-06-28 NOTE — Progress Notes (Signed)
I have seen, interviewed, and examined the patient in conjunction with the [employer] and affirm the diagnosis and management plan.   Gunnar Bulla, CNM [facility], [facility] 06/28/20 10:55 AM

## 2020-06-28 NOTE — Patient Instructions (Signed)
Fetal Movement Counts Patient Name: ________________________________________________ Patient Due Date: ____________________  What is a fetal movement count? A fetal movement count is the number of times that you feel your baby move during a certain amount of time. This may also be called a fetal kick count. A fetal movement count is recommended for every pregnant woman. You may be asked to start counting fetal movements as early as week 28 of your pregnancy. Pay attention to when your baby is most active. You may notice your baby's sleep and wake cycles. You may also notice things that make your baby move more. You should do a fetal movement count:  When your baby is normally most active.  At the same time each day. A good time to count movements is while you are resting, after having something to eat and drink. How do I count fetal movements? 1. Find a quiet, comfortable area. Sit, or lie down on your side. 2. Write down the date, the start time and stop time, and the number of movements that you felt between those two times. Take this information with you to your health care visits. 3. Write down your start time when you feel the first movement. 4. Count kicks, flutters, swishes, rolls, and jabs. You should feel at least 10 movements. 5. You may stop counting after you have felt 10 movements, or if you have been counting for 2 hours. Write down the stop time. 6. If you do not feel 10 movements in 2 hours, contact your health care provider for further instructions. Your health care provider may want to do additional tests to assess your baby's well-being. Contact a health care provider if:  You feel fewer than 10 movements in 2 hours.  Your baby is not moving like he or she usually does. Date: ____________ Start time: ____________ Stop time: ____________ Movements: ____________ Date: ____________ Start time: ____________ Stop time: ____________ Movements: ____________ Date: ____________  Start time: ____________ Stop time: ____________ Movements: ____________ Date: ____________ Start time: ____________ Stop time: ____________ Movements: ____________ Date: ____________ Start time: ____________ Stop time: ____________ Movements: ____________ Date: ____________ Start time: ____________ Stop time: ____________ Movements: ____________ Date: ____________ Start time: ____________ Stop time: ____________ Movements: ____________ Date: ____________ Start time: ____________ Stop time: ____________ Movements: ____________ Date: ____________ Start time: ____________ Stop time: ____________ Movements: ____________ This information is not intended to replace advice given to you by your health care provider. Make sure you discuss any questions you have with your health care provider. Document Revised: 11/28/2018 Document Reviewed: 11/28/2018 Elsevier Patient Education  2021 [place] [employer]  The third trimester of pregnancy is from week 28 through week 73. This is also called months 7 through 9. This trimester is when your unborn baby (fetus) is growing very fast. At the end of the ninth month, the unborn baby is about 20 inches long. It weighs about 6-10 pounds. Body changes during your third trimester Your body continues to go through many changes during this time. The changes vary and generally return to normal after the baby is born. Physical changes  Your weight will continue to increase. You may gain 25-35 pounds (11-16 kg) by the end of the pregnancy. If you are underweight, you may gain 28-40 lb (about 13-18 kg). If you are overweight, you may gain 15-25 lb (about 7-11 kg).  You may start to get stretch marks on your hips, belly (abdomen), and breasts.  Your breasts will continue to grow  and may hurt. A yellow fluid (colostrum) may leak from your breasts. This is the first milk you are making for your baby.  You may have changes in your hair.  Your  belly button may stick out.  You may have more swelling in your hands, face, or ankles. Health changes  You may have heartburn.  You may have trouble pooping (constipation).  You may get hemorrhoids. These are swollen veins in the butt that can itch or get painful.  You may have swollen veins (varicose veins) in your legs.  You may have more body aches in the pelvis, back, or thighs.  You may have more tingling or numbness in your hands, arms, and legs. The skin on your belly may also feel numb.  You may feel short of breath as your womb (uterus) gets bigger. Other changes  You may pee (urinate) more often.  You may have more problems sleeping.  You may notice the unborn baby "dropping," or moving lower in your belly.  You may have more discharge coming from your vagina.  Your joints may feel loose, and you may have pain around your pelvic bone. Follow these instructions at home: Medicines  Take over-the-counter and prescription medicines only as told by your doctor. Some medicines are not safe during pregnancy.  Take a prenatal vitamin that contains at least 600 micrograms (mcg) of folic acid. Eating and drinking  Eat healthy meals that include: ? Fresh fruits and vegetables. ? Whole grains. ? Good sources of protein, such as meat, eggs, or tofu. ? Low-fat dairy products.  Avoid raw meat and unpasteurized juice, milk, and cheese. These carry germs that can harm you and your baby.  Eat 4 or 5 small meals rather than 3 large meals a day.  You may need to take these actions to prevent or treat trouble pooping: ? Drink enough fluids to keep your pee (urine) pale yellow. ? Eat foods that are high in fiber. These include beans, whole grains, and fresh fruits and vegetables. ? Limit foods that are high in fat and sugar. These include fried or sweet foods. Activity  Exercise only as told by your doctor. Stop exercising if you start to have cramps in your womb.  Avoid  heavy lifting.  Do not exercise if it is too hot or too humid, or if you are in a place of great height (high altitude).  If you choose to, you may have sex unless your doctor tells you not to. Relieving pain and discomfort  Take breaks often, and rest with your legs raised (elevated) if you have leg cramps or low back pain.  Take warm water baths (sitz baths) to soothe pain or discomfort caused by hemorrhoids. Use hemorrhoid cream if your doctor approves.  Wear a good support bra if your breasts are tender.  If you develop bulging, swollen veins in your legs: ? Wear support hose as told by your doctor. ? Raise your feet for 15 minutes, 3-4 times a day. ? Limit salt in your food. Safety  Talk to your doctor before traveling far distances.  Do not use hot tubs, steam rooms, or saunas.  Wear your seat belt at all times when you are in a car.  Talk with your doctor if someone is hurting you or yelling at you a lot. Preparing for your baby's arrival To prepare for the arrival of your baby:  Take prenatal classes.  Visit the hospital and tour the maternity area.  Buy a  rear-facing car seat. Learn how to install it in your car.  Prepare the baby's room. Take out all pillows and stuffed animals from the baby's crib. General instructions  Avoid cat litter boxes and soil used by cats. These carry germs that can cause harm to the baby and can cause a loss of your baby by miscarriage or stillbirth.  Do not douche or use tampons. Do not use scented sanitary pads.  Do not smoke or use any products that contain nicotine or tobacco. If you need help quitting, ask your doctor.  Do not drink alcohol.  Do not use herbal medicines, illegal drugs, or medicines that were not approved by your doctor. Chemicals in these products can affect your baby.  Keep all follow-up visits. This is important. Where to find more information  American Pregnancy Association:  americanpregnancy.org  [employer]: www.acog.org  Office on Women's Health: [employer] Contact a doctor if:  You have a fever.  You have mild cramps or pressure in your lower belly.  You have a nagging pain in your belly area.  You vomit, or you have watery poop (diarrhea).  You have bad-smelling fluid coming from your vagina.  You have pain when you pee, or your pee smells bad.  You have a headache that does not go away when you take medicine.  You have changes in how you see, or you see spots in front of your eyes. Get help right away if:  Your water breaks.  You have regular contractions that are less than 5 minutes apart.  You are spotting or bleeding from your vagina.  You have very bad belly cramps or pain.  You have trouble breathing.  You have chest pain.  You faint.  You have not felt the baby move for the amount of time told by your doctor.  You have new or increased pain, swelling, or redness in an arm or leg. Summary  The third trimester is from week 28 through week 40 (months 7 through 9). This is the time when your unborn baby is growing very fast.  During this time, your discomfort may increase as you gain weight and as your baby grows.  Get ready for your baby to arrive by taking prenatal classes, buying a rear-facing car seat, and preparing the baby's room.  Get help right away if you are bleeding from your vagina, you have chest pain and trouble breathing, or you have not felt the baby move for the amount of time told by your doctor. This information is not intended to replace advice given to you by your health care provider. Make sure you discuss any questions you have with your health care provider. Document Revised: 09/17/2019 Document Reviewed: 07/24/2019 Elsevier Patient Education  2021 Elsevier Inc.   Group B Streptococcus Test During Pregnancy Why am I having this test? Routine  testing, also called screening, for group B streptococcus (GBS) is recommended for all pregnant women between the 36th and 37th week of pregnancy. GBS is a type of bacteria that can be passed from mother to baby during childbirth. Screening will help guide whether or not you will need treatment during labor and delivery to prevent complications such as:  An infection in your uterus during labor.  An infection in your uterus after delivery.  A serious infection in your baby after delivery, such as pneumonia, meningitis, or sepsis. GBS screening is not often done before 36 weeks of pregnancy unless you go into labor  prematurely. What happens if I have group B streptococcus? If testing shows that you have GBS, your health care provider will recommend treatment with IV antibiotics during labor and delivery. This treatment significantly decreases the risk of complications for you and your baby. If you have a planned C-section and you have GBS, you may not need to be treated with antibiotics because GBS is usually passed to babies after labor starts and your water breaks. If you are in labor or your water breaks before your C-section, it is possible for GBS to get into your uterus and be passed to your baby, so you might need treatment. Is there a chance I may not need to be tested? You may not need to be tested for GBS if:  You have a urine test that shows GBS before 36 to 37 weeks.  You had a baby with GBS infection after a previous delivery. In these cases, you will automatically be treated for GBS during labor and delivery. What is being tested? This test is done to check if you have group B streptococcus in your vagina or rectum. What kind of sample is taken? To collect samples for this test, your health care provider will swab your vagina and rectum with a cotton swab. The sample is then sent to the lab to see if GBS is present. What happens during the test?  You will remove your clothing  from the waist down.  You will lie down on an exam table in the same position as you would for a pelvic exam.  Your health care provider will swab your vagina and rectum to collect samples for a culture test.  You will be able to go home after the test and do all your usual activities.   How are the results reported? The test results are reported as positive or negative. What do the results mean?  A positive test means you are at risk for passing GBS to your baby during labor and delivery. Your health care provider will recommend that you are treated with an IV antibiotic during labor and delivery.  A negative test means you are at very low risk of passing GBS to your baby. There is still a low risk of passing GBS to your baby because sometimes test results may report that you do not have a condition when you do (false-negative result) or there is a chance that you may become infected with GBS after the test is done. You most likely will not need to be treated with an antibiotic during labor and delivery. Talk with your health care provider about what your results mean. Questions to ask your health care provider Ask your health care provider, or the department that is doing the test:  When will my results be ready?  How will I get my results?  What are my treatment options? Summary  Routine testing (screening) for group B streptococcus (GBS) is recommended for all pregnant women between the 36th and 37th week of pregnancy.  GBS is a type of bacteria that can be passed from mother to baby during childbirth.  If testing shows that you have GBS, your health care provider will recommend that you are treated with IV antibiotics during labor and delivery. This treatment almost always prevents infection in newborns. This information is not intended to replace advice given to you by your health care provider. Make sure you discuss any questions you have with your health care provider. Document  Revised: 02/10/2020 Document Reviewed:  05/08/2018 Elsevier Patient Education  2021 [employer].

## 2020-07-01 LAB — CERVICOVAGINAL ANCILLARY ONLY
Bacterial Vaginitis (gardnerella): POSITIVE — AB
Candida Glabrata: NEGATIVE
Candida Vaginitis: POSITIVE — AB
Chlamydia: NEGATIVE
Comment: NEGATIVE
Comment: NEGATIVE
Comment: NEGATIVE
Comment: NEGATIVE
Comment: NORMAL
Neisseria Gonorrhea: NEGATIVE

## 2020-07-05 ENCOUNTER — Encounter: Payer: Self-pay | Admitting: Certified Nurse Midwife

## 2020-07-05 ENCOUNTER — Other Ambulatory Visit: Payer: Self-pay

## 2020-07-05 ENCOUNTER — Ambulatory Visit (INDEPENDENT_AMBULATORY_CARE_PROVIDER_SITE_OTHER): Payer: Commercial Managed Care - PPO | Admitting: Certified Nurse Midwife

## 2020-07-05 VITALS — BP 120/75 | HR 70 | Wt 196.7 lb

## 2020-07-05 DIAGNOSIS — F419 Anxiety disorder, unspecified: Secondary | ICD-10-CM

## 2020-07-05 DIAGNOSIS — R110 Nausea: Secondary | ICD-10-CM

## 2020-07-05 DIAGNOSIS — O26843 Uterine size-date discrepancy, third trimester: Secondary | ICD-10-CM

## 2020-07-05 DIAGNOSIS — Z3483 Encounter for supervision of other normal pregnancy, third trimester: Secondary | ICD-10-CM

## 2020-07-05 DIAGNOSIS — F32A Depression, unspecified: Secondary | ICD-10-CM

## 2020-07-05 DIAGNOSIS — Z3A37 37 weeks gestation of pregnancy: Secondary | ICD-10-CM

## 2020-07-05 LAB — POCT URINALYSIS DIPSTICK OB
Bilirubin, UA: NEGATIVE
Blood, UA: NEGATIVE
Glucose, UA: NEGATIVE
Ketones, UA: NEGATIVE
Nitrite, UA: NEGATIVE
POC,PROTEIN,UA: NEGATIVE
Spec Grav, UA: <=1.005 — AB (ref 1.010–1.025)
Urobilinogen, UA: 0.2 E.U./dL
pH, UA: 7.5 (ref 5.0–8.0)

## 2020-07-05 NOTE — Progress Notes (Signed)
ROB-Pt present for routine prenatal care. Pt stated that she was having some pressure. PHQ-9=16. GAD-7= 18.

## 2020-07-05 NOTE — Progress Notes (Signed)
I have seen, interviewed, and examined the patient in conjunction with the [facility] [employer] [facility] and affirm the diagnosis and management plan.   Gunnar Bulla, CNM [facility], [facility] 07/05/20 1:42 PM

## 2020-07-05 NOTE — Patient Instructions (Signed)
 Fetal Movement Counts Patient Name: ________________________________________________ Patient Due Date: ____________________  What is a fetal movement count? A fetal movement count is the number of times that you feel your baby move during a certain amount of time. This may also be called a fetal kick count. A fetal movement count is recommended for every pregnant woman. You may be asked to start counting fetal movements as early as week 28 of your pregnancy. Pay attention to when your baby is most active. You may notice your baby's sleep and wake cycles. You may also notice things that make your baby move more. You should do a fetal movement count:  When your baby is normally most active.  At the same time each day. A good time to count movements is while you are resting, after having something to eat and drink. How do I count fetal movements? 1. Find a quiet, comfortable area. Sit, or lie down on your side. 2. Write down the date, the start time and stop time, and the number of movements that you felt between those two times. Take this information with you to your health care visits. 3. Write down your start time when you feel the first movement. 4. Count kicks, flutters, swishes, rolls, and jabs. You should feel at least 10 movements. 5. You may stop counting after you have felt 10 movements, or if you have been counting for 2 hours. Write down the stop time. 6. If you do not feel 10 movements in 2 hours, contact your health care provider for further instructions. Your health care provider may want to do additional tests to assess your baby's well-being. Contact a health care provider if:  You feel fewer than 10 movements in 2 hours.  Your baby is not moving like he or she usually does. Date: ____________ Start time: ____________ Stop time: ____________ Movements: ____________ Date: ____________ Start time: ____________ Stop time: ____________ Movements: ____________ Date: ____________  Start time: ____________ Stop time: ____________ Movements: ____________ Date: ____________ Start time: ____________ Stop time: ____________ Movements: ____________ Date: ____________ Start time: ____________ Stop time: ____________ Movements: ____________ Date: ____________ Start time: ____________ Stop time: ____________ Movements: ____________ Date: ____________ Start time: ____________ Stop time: ____________ Movements: ____________ Date: ____________ Start time: ____________ Stop time: ____________ Movements: ____________ Date: ____________ Start time: ____________ Stop time: ____________ Movements: ____________ This information is not intended to replace advice given to you by your health care provider. Make sure you discuss any questions you have with your health care provider. Document Revised: 11/28/2018 Document Reviewed: 11/28/2018 Elsevier Patient Education  2021 Elsevier Inc.    Third Trimester of Pregnancy  The third trimester of pregnancy is from week 28 through week 40. This is also called months 7 through 9. This trimester is when your unborn baby (fetus) is growing very fast. At the end of the ninth month, the unborn baby is about 20 inches long. It weighs about 6-10 pounds. Body changes during your third trimester Your body continues to go through many changes during this time. The changes vary and generally return to normal after the baby is born. Physical changes  Your weight will continue to increase. You may gain 25-35 pounds (11-16 kg) by the end of the pregnancy. If you are underweight, you may gain 28-40 lb (about 13-18 kg). If you are overweight, you may gain 15-25 lb (about 7-11 kg).  You may start to get stretch marks on your hips, belly (abdomen), and breasts.  Your breasts will continue to   grow and may hurt. A yellow fluid (colostrum) may leak from your breasts. This is the first milk you are making for your baby.  You may have changes in your hair.  Your  belly button may stick out.  You may have more swelling in your hands, face, or ankles. Health changes  You may have heartburn.  You may have trouble pooping (constipation).  You may get hemorrhoids. These are swollen veins in the butt that can itch or get painful.  You may have swollen veins (varicose veins) in your legs.  You may have more body aches in the pelvis, back, or thighs.  You may have more tingling or numbness in your hands, arms, and legs. The skin on your belly may also feel numb.  You may feel short of breath as your womb (uterus) gets bigger. Other changes  You may pee (urinate) more often.  You may have more problems sleeping.  You may notice the unborn baby "dropping," or moving lower in your belly.  You may have more discharge coming from your vagina.  Your joints may feel loose, and you may have pain around your pelvic bone. Follow these instructions at home: Medicines  Take over-the-counter and prescription medicines only as told by your doctor. Some medicines are not safe during pregnancy.  Take a prenatal vitamin that contains at least 600 micrograms (mcg) of folic acid. Eating and drinking  Eat healthy meals that include: ? Fresh fruits and vegetables. ? Whole grains. ? Good sources of protein, such as meat, eggs, or tofu. ? Low-fat dairy products.  Avoid raw meat and unpasteurized juice, milk, and cheese. These carry germs that can harm you and your baby.  Eat 4 or 5 small meals rather than 3 large meals a day.  You may need to take these actions to prevent or treat trouble pooping: ? Drink enough fluids to keep your pee (urine) pale yellow. ? Eat foods that are high in fiber. These include beans, whole grains, and fresh fruits and vegetables. ? Limit foods that are high in fat and sugar. These include fried or sweet foods. Activity  Exercise only as told by your doctor. Stop exercising if you start to have cramps in your womb.  Avoid  heavy lifting.  Do not exercise if it is too hot or too humid, or if you are in a place of great height (high altitude).  If you choose to, you may have sex unless your doctor tells you not to. Relieving pain and discomfort  Take breaks often, and rest with your legs raised (elevated) if you have leg cramps or low back pain.  Take warm water baths (sitz baths) to soothe pain or discomfort caused by hemorrhoids. Use hemorrhoid cream if your doctor approves.  Wear a good support bra if your breasts are tender.  If you develop bulging, swollen veins in your legs: ? Wear support hose as told by your doctor. ? Raise your feet for 15 minutes, 3-4 times a day. ? Limit salt in your food. Safety  Talk to your doctor before traveling far distances.  Do not use hot tubs, steam rooms, or saunas.  Wear your seat belt at all times when you are in a car.  Talk with your doctor if someone is hurting you or yelling at you a lot. Preparing for your baby's arrival To prepare for the arrival of your baby:  Take prenatal classes.  Visit the hospital and tour the maternity area.  Buy   a rear-facing car seat. Learn how to install it in your car.  Prepare the baby's room. Take out all pillows and stuffed animals from the baby's crib. General instructions  Avoid cat litter boxes and soil used by cats. These carry germs that can cause harm to the baby and can cause a loss of your baby by miscarriage or stillbirth.  Do not douche or use tampons. Do not use scented sanitary pads.  Do not smoke or use any products that contain nicotine or tobacco. If you need help quitting, ask your doctor.  Do not drink alcohol.  Do not use herbal medicines, illegal drugs, or medicines that were not approved by your doctor. Chemicals in these products can affect your baby.  Keep all follow-up visits. This is important. Where to find more information  American Pregnancy Association:  americanpregnancy.org  American College of Obstetricians and Gynecologists: www.acog.org  Office on Women's Health: womenshealth.gov/pregnancy Contact a doctor if:  You have a fever.  You have mild cramps or pressure in your lower belly.  You have a nagging pain in your belly area.  You vomit, or you have watery poop (diarrhea).  You have bad-smelling fluid coming from your vagina.  You have pain when you pee, or your pee smells bad.  You have a headache that does not go away when you take medicine.  You have changes in how you see, or you see spots in front of your eyes. Get help right away if:  Your water breaks.  You have regular contractions that are less than 5 minutes apart.  You are spotting or bleeding from your vagina.  You have very bad belly cramps or pain.  You have trouble breathing.  You have chest pain.  You faint.  You have not felt the baby move for the amount of time told by your doctor.  You have new or increased pain, swelling, or redness in an arm or leg. Summary  The third trimester is from week 28 through week 40 (months 7 through 9). This is the time when your unborn baby is growing very fast.  During this time, your discomfort may increase as you gain weight and as your baby grows.  Get ready for your baby to arrive by taking prenatal classes, buying a rear-facing car seat, and preparing the baby's room.  Get help right away if you are bleeding from your vagina, you have chest pain and trouble breathing, or you have not felt the baby move for the amount of time told by your doctor. This information is not intended to replace advice given to you by your health care provider. Make sure you discuss any questions you have with your health care provider. Document Revised: 09/17/2019 Document Reviewed: 07/24/2019 Elsevier Patient Education  2021 Elsevier Inc.  

## 2020-07-05 NOTE — Progress Notes (Signed)
ROB- Doing well overall, stable on current dose of Zoloft. Reports constant nausea without vomiting since last week despite medication use. Continues counseling and chiropractic care. Referral to MFM for growth ultrasound due to size less than dates, limited weight gain and use of antidepressants during pregnancy. Anticipatory guidance regarding course of prenatal care. Reviewed red flag symptoms and when to call. RTC x 1 week for ROB or sooner if needed.   GAD 7 : Generalized Anxiety Score 07/05/2020 06/21/2020 06/03/2020 05/24/2020  Nervous, Anxious, on Edge 3 3 3 3   Control/stop worrying 3 3 3 3   Worry too much - different things 3 3 3 3   Trouble relaxing 3 3 3 3   Restless 1 1 1 2   Easily annoyed or irritable 2 2 3 3   Afraid - awful might happen 3 3 3 3   Total GAD 7 Score 18 18 19 20   Anxiety Difficulty Somewhat difficult Very difficult Very difficult Very difficult    Depression screen [facility] 2/9 07/05/2020 06/21/2020 06/03/2020 05/24/2020 05/24/2020  Decreased Interest 2 3 3 3 3   Down, Depressed, Hopeless 3 3 3 3 3   PHQ - 2 Score 5 6 6 6 6   Altered sleeping 3 3 3 3 3   Tired, decreased energy 3 3 3 3 3   Change in appetite 3 3 3 3 3   Feeling bad or failure about yourself  2 2 3 3 3   Trouble concentrating 0 3 3 3 3   Moving slowly or fidgety/restless 0 1 3 3 3   Suicidal thoughts 0 1 1 2 2   PHQ-9 Score 16 22 25 26 26   Difficult doing work/chores Somewhat difficult Very difficult Very difficult Very difficult Very difficult  Some recent data might be hidden   , Student-MidWife [employer] 07/05/20 9:25 AM

## 2020-07-08 ENCOUNTER — Other Ambulatory Visit: Payer: Self-pay | Admitting: Certified Nurse Midwife

## 2020-07-08 ENCOUNTER — Ambulatory Visit: Payer: Commercial Managed Care - PPO | Attending: Obstetrics and Gynecology | Admitting: *Deleted

## 2020-07-08 ENCOUNTER — Encounter: Payer: Self-pay | Admitting: *Deleted

## 2020-07-08 ENCOUNTER — Ambulatory Visit (HOSPITAL_BASED_OUTPATIENT_CLINIC_OR_DEPARTMENT_OTHER): Payer: Commercial Managed Care - PPO

## 2020-07-08 ENCOUNTER — Other Ambulatory Visit: Payer: Self-pay

## 2020-07-08 VITALS — BP 121/72 | HR 97

## 2020-07-08 DIAGNOSIS — Z363 Encounter for antenatal screening for malformations: Secondary | ICD-10-CM | POA: Insufficient documentation

## 2020-07-08 DIAGNOSIS — O99343 Other mental disorders complicating pregnancy, third trimester: Secondary | ICD-10-CM | POA: Insufficient documentation

## 2020-07-08 DIAGNOSIS — Z3A37 37 weeks gestation of pregnancy: Secondary | ICD-10-CM

## 2020-07-08 DIAGNOSIS — O26843 Uterine size-date discrepancy, third trimester: Secondary | ICD-10-CM | POA: Insufficient documentation

## 2020-07-08 DIAGNOSIS — O26849 Uterine size-date discrepancy, unspecified trimester: Secondary | ICD-10-CM

## 2020-07-08 DIAGNOSIS — O365930 Maternal care for other known or suspected poor fetal growth, third trimester, not applicable or unspecified: Secondary | ICD-10-CM | POA: Diagnosis not present

## 2020-07-08 DIAGNOSIS — O26893 Other specified pregnancy related conditions, third trimester: Secondary | ICD-10-CM

## 2020-07-08 IMAGING — US US MFM OB DETAIL+14 WK
1 series · 13 of 28 positions shown · non-contrast
Comparison: none

[Series 1: us mfm ob detail+14 wk · 102 acquisitions, 13 frames shown]
[im 4/102]
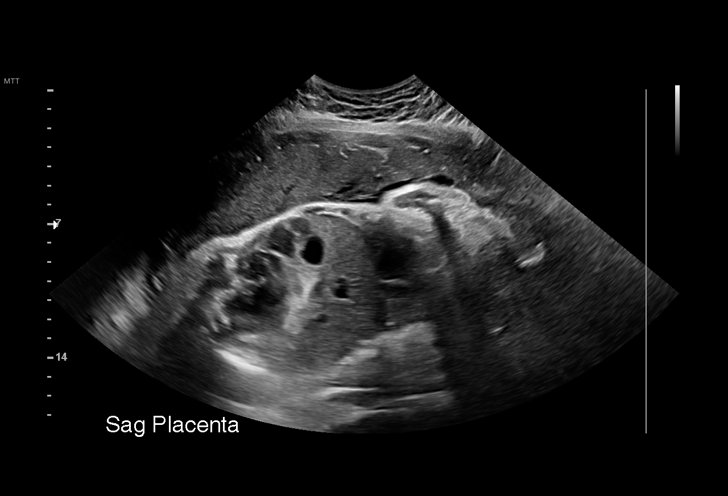
[im 12/102]
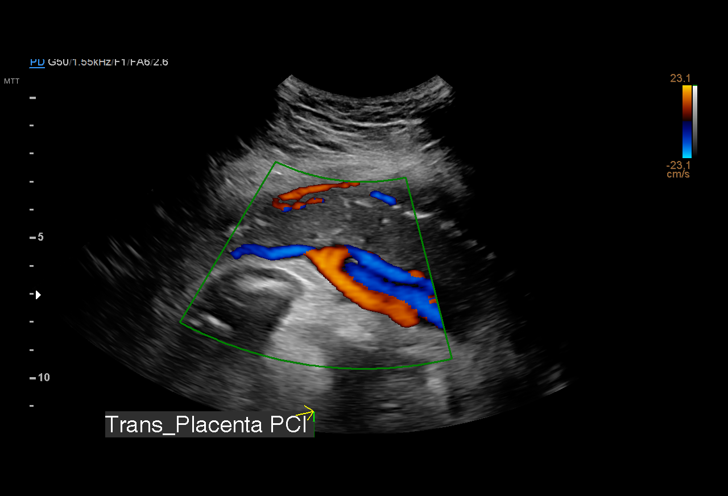
[im 19/102]
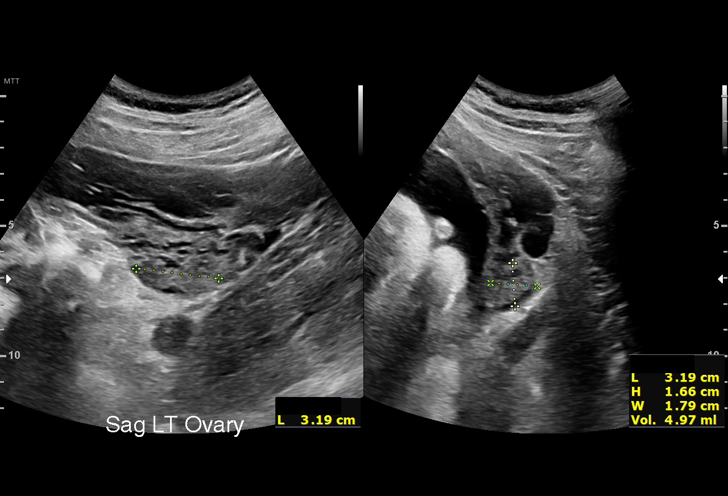
[im 27/102]
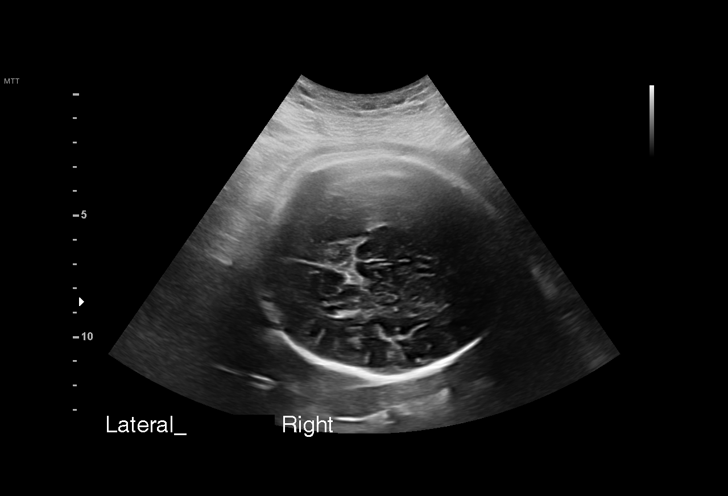
[im 34/102]
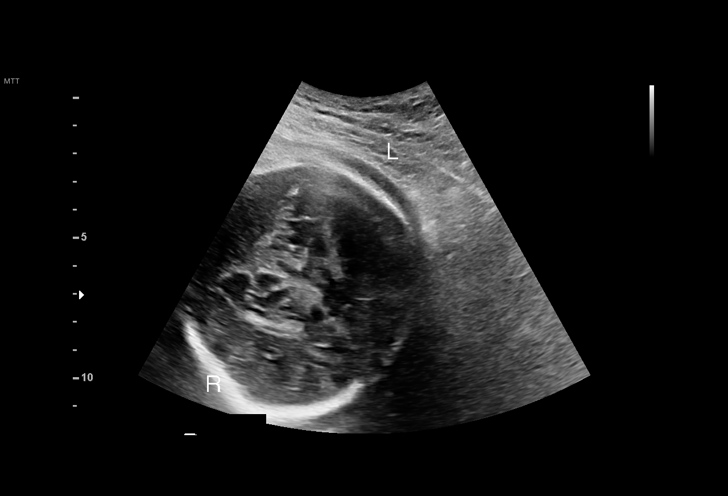
[im 42/102]
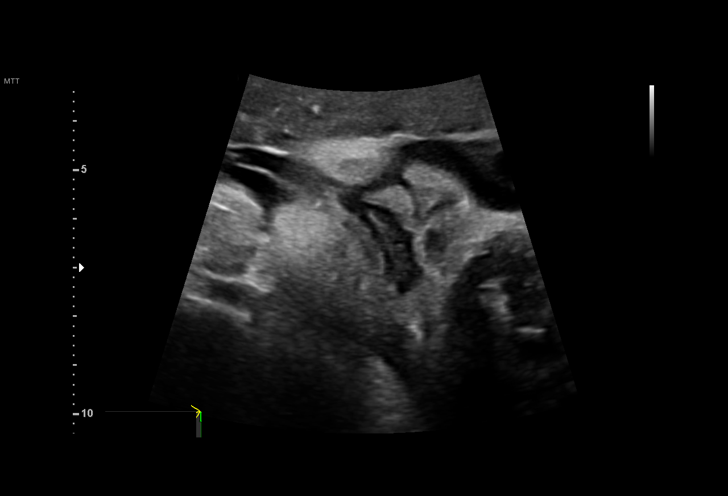
[im 53/102]
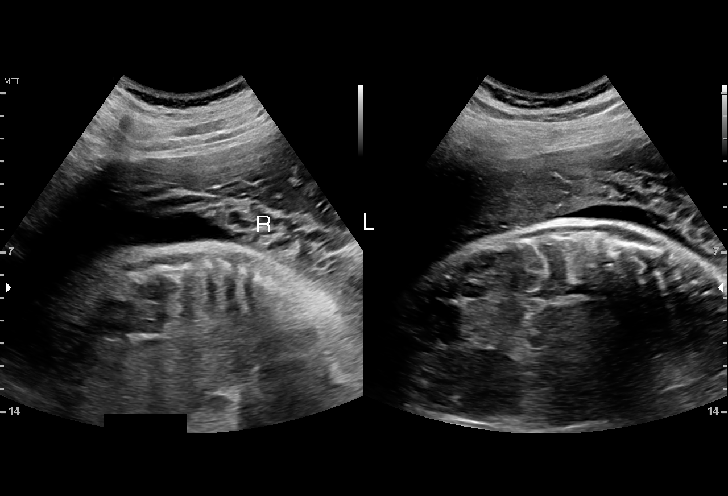
[im 60/102]
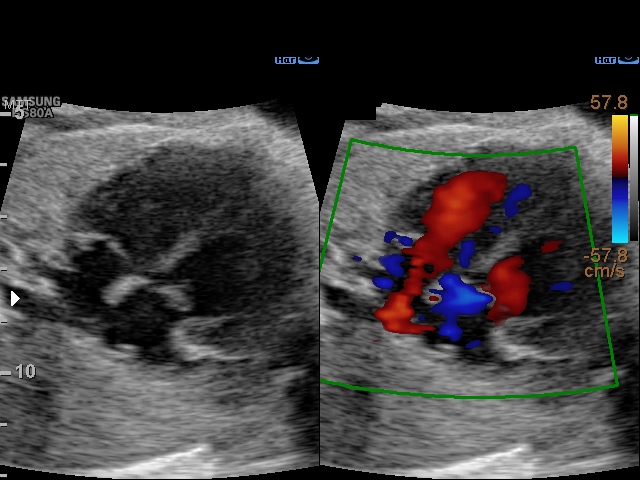
[im 68/102]
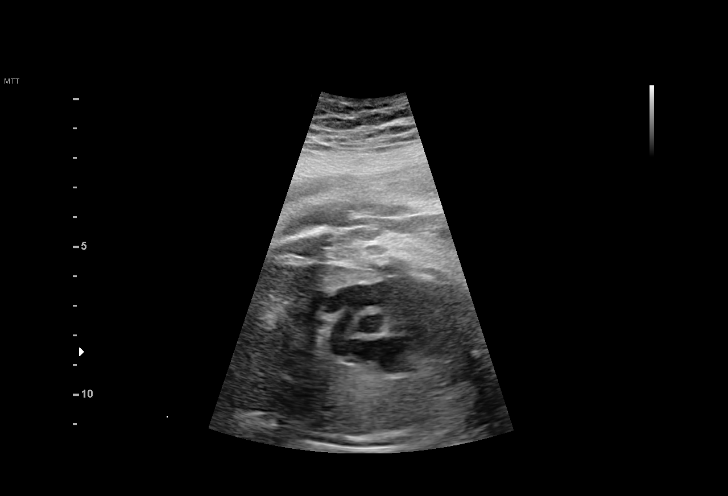
[im 75/102]
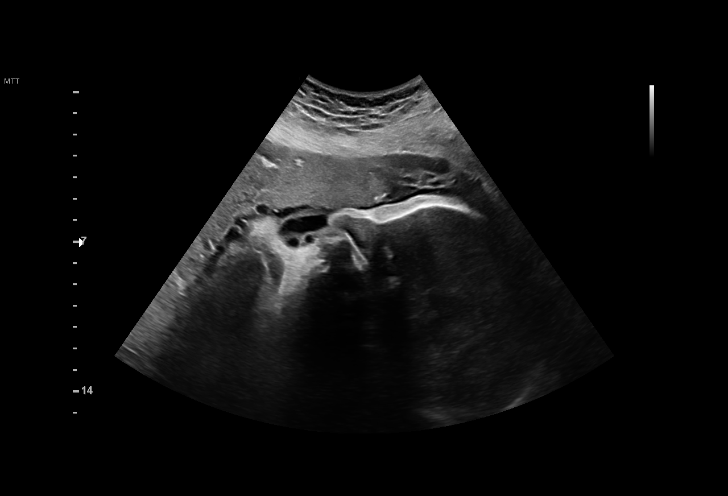
[im 83/102]
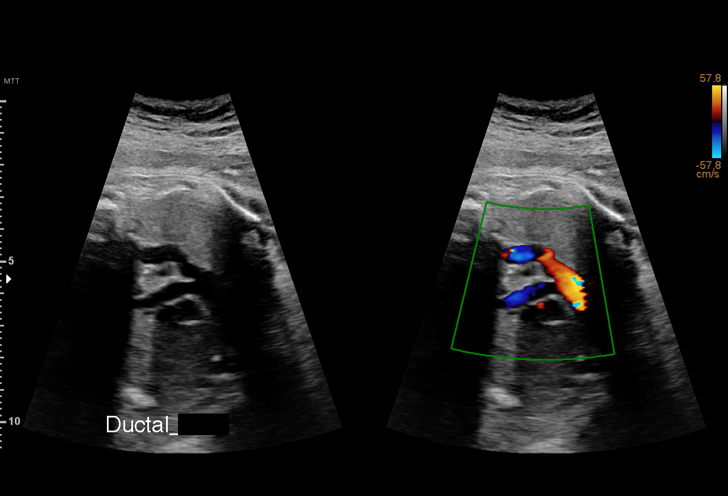
[im 90/102]
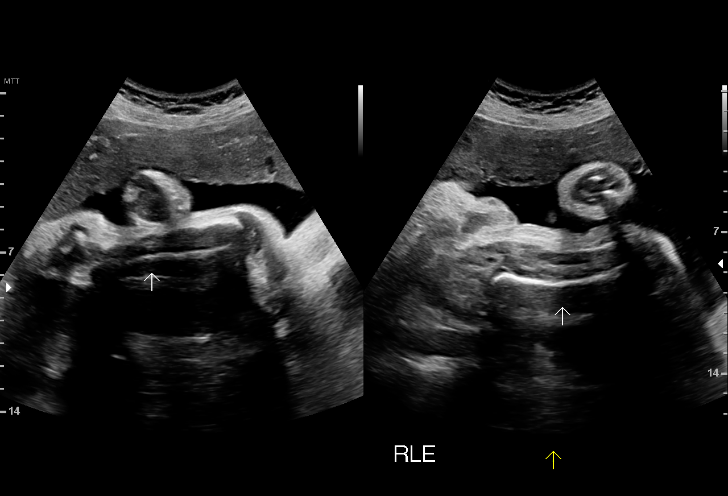
[im 98/102]
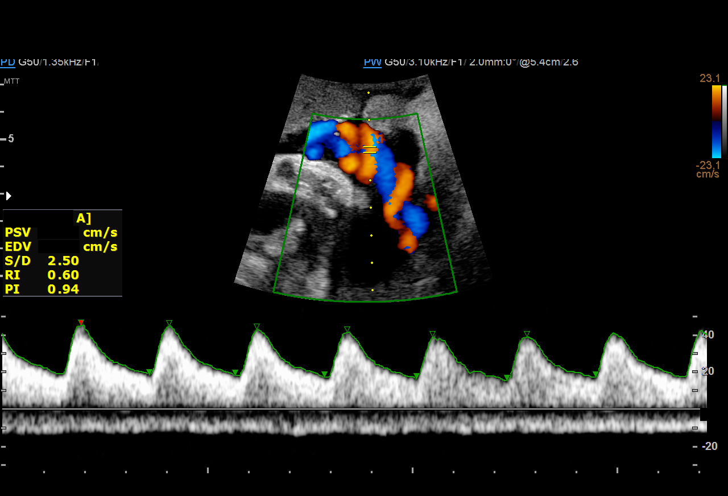

[13 of 28 positions shown; findings below may reference images not displayed]

[facility] CNM
                                                            [facility] [HOSPITAL]
 Ref. Address:     [REDACTED]
                   [25]

 2  US MFM UA CORD DOPPLER                [address].02    [facility]

Indications

 Uterine size-date discrepancy, third trimester [25]
 37 weeks gestation of pregnancy
 Encounter for antenatal screening for          [25]
 malformations
 Other mental disorder complicating             [25]
 pregnancy, unspecified trimester
Fetal Evaluation

 Num Of Fetuses:         1
 Fetal Heart Rate(bpm):  129
 Cardiac Activity:       Observed
 Presentation:           Cephalic
 Placenta:               Anterior
 P. Cord Insertion:      Visualized, central
 Amniotic Fluid
 AFI FV:      Within normal limits

 AFI Sum(cm)     %Tile       Largest Pocket(cm)
 14.6            56

 RUQ(cm)       RLQ(cm)       LUQ(cm)        LLQ(cm)

Biophysical Evaluation

 Amniotic F.V:   Pocket => 2 cm             F. Tone:        Observed
 F. Movement:    Observed                   Score:          [DATE]
 F. Breathing:   Observed
Biometry

 BPD:      89.6  mm     G. Age:  36w 2d         28  %    CI:        80.23   %    70 - 86
                                                         FL/HC:      21.7   %    20.9 -
 HC:       316   mm     G. Age:  35w 3d        1.6  %    HC/AC:      1.00        0.92 -
 AC:      315.5  mm     G. Age:  35w 3d          9  %    FL/BPD:     76.6   %    71 - 87
 FL:       68.6  mm     G. Age:  35w 2d        4.1  %    FL/AC:      21.7   %    20 - 24
 HUM:      61.5  mm     G. Age:  35w 4d         35  %
 CER:      52.6  mm     G. Age:  37w 5d         59  %

 LV:        4.2  mm
 CM:        5.9  mm

 Est. FW:    [25]  gm    5 lb 15 oz      10  %
OB History

 Blood Type:   A+
 Gravidity:    3         Term:   1        Prem:   0        SAB:   1
 TOP:          0       Ectopic:  0        Living: 1
Gestational Age

 LMP:           39w 2d        Date:  [DATE]                 EDD:   [DATE]
 U/S Today:     35w 4d                                        EDD:   [DATE]
 Best:          37w 6d     Det. By:  Early Ultrasound         EDD:   [DATE]
                                     ([DATE])
Anatomy

 Cranium:               Appears normal         Aortic Arch:            Appears normal
 Cavum:                 Appears normal         Ductal Arch:            Appears normal
 Ventricles:            Appears normal         Diaphragm:              Appears normal
 Choroid Plexus:        Appears normal         Stomach:                Appears normal, left
                                                                       sided
 Cerebellum:            Appears normal         Abdomen:                Appears normal
 Posterior Fossa:       Not well visualized    Abdominal Wall:         Not well visualized
 Nuchal Fold:           Not applicable (>20    Cord Vessels:           Not well visualized
                        wks GA)
 Face:                  Appears normal         Kidneys:                Appear normal
                        (orbits and profile)
 Lips:                  Appears normal         Bladder:                Appears normal
 Thoracic:              Appears normal         Spine:                  Not well visualized
 Heart:                 Appears normal         Upper Extremities:      Visualized
                        (4CH, axis, and
                        situs)
 RVOT:                  Appears normal         Lower Extremities:      Visualized
 LVOT:                  Appears normal

 Other:  Technically difficult due to advanced GA and fetal position.
Doppler - Fetal Vessels

 Umbilical Artery
  S/D     %tile      RI    %tile      PI    %tile     PSV    ADFV    RDFV
                                                    (cm/s)
  2.84       81    0.65       87    0.[REDACTED]      No      No

Cervix Uterus Adnexa

 Cervix
 Not visualized (advanced GA >[25])

 Uterus
 No abnormality visualized.

 Right Ovary
 Within normal limits.

 Left Ovary
 Within normal limits.

 Cul De Sac
 No free fluid seen.

 Adnexa
 No abnormality visualized.
Comments

 This patient was seen for for an exam as her fundal heights
 have measured less than her dates.  The patient reports that
 her current pregnancy has been complicated by extreme
 nausea and vomiting associated with pregnancy.  She reports
 that she has lost weight and has not been eating much due to
 the persistent nausea and vomiting.  During her last prenatal
 visit, her fundal height was measuring about 2 weeks behind
 her dates.  She reports that her cervix is already 1 to 2 cm
 dilated.
 On today's exam, the EFW measures at the 10th percentile
 for her gestational age indicating borderline fetal growth
 restriction.   There was normal amniotic fluid noted.
 The views of the fetal anatomy were limited today due to her
 advanced gestational age.
 A biophysical profile performed today due to fetal growth
 restriction was [DATE].
 Doppler studies of the umbilical arteries showed a normal
 S/D ratio of 2.84.  There were no signs of absent or reversed
 end-diastolic flow.
 Due to borderline fetal growth restriction, delivery is
 recommended at around 38 weeks.  As her cervix is
 favorable, an induction of labor should be scheduled for her
 within the next few days.

## 2020-07-09 ENCOUNTER — Encounter: Payer: Self-pay | Admitting: Certified Nurse Midwife

## 2020-07-09 DIAGNOSIS — Z8759 Personal history of other complications of pregnancy, childbirth and the puerperium: Secondary | ICD-10-CM | POA: Insufficient documentation

## 2020-07-09 DIAGNOSIS — O365990 Maternal care for other known or suspected poor fetal growth, unspecified trimester, not applicable or unspecified: Secondary | ICD-10-CM | POA: Insufficient documentation

## 2020-07-12 ENCOUNTER — Encounter: Payer: Self-pay | Admitting: Certified Nurse Midwife

## 2020-07-12 ENCOUNTER — Encounter: Payer: Commercial Managed Care - PPO | Admitting: Certified Nurse Midwife

## 2020-07-12 ENCOUNTER — Other Ambulatory Visit: Payer: Self-pay

## 2020-07-12 ENCOUNTER — Ambulatory Visit (INDEPENDENT_AMBULATORY_CARE_PROVIDER_SITE_OTHER): Payer: Commercial Managed Care - PPO | Admitting: Certified Nurse Midwife

## 2020-07-12 VITALS — BP 119/75 | HR 91 | Wt 188.7 lb

## 2020-07-12 DIAGNOSIS — O365990 Maternal care for other known or suspected poor fetal growth, unspecified trimester, not applicable or unspecified: Secondary | ICD-10-CM

## 2020-07-12 DIAGNOSIS — Z3A38 38 weeks gestation of pregnancy: Secondary | ICD-10-CM

## 2020-07-12 DIAGNOSIS — Z3483 Encounter for supervision of other normal pregnancy, third trimester: Secondary | ICD-10-CM | POA: Diagnosis not present

## 2020-07-12 DIAGNOSIS — Z3490 Encounter for supervision of normal pregnancy, unspecified, unspecified trimester: Secondary | ICD-10-CM

## 2020-07-12 DIAGNOSIS — Z3689 Encounter for other specified antenatal screening: Secondary | ICD-10-CM | POA: Diagnosis not present

## 2020-07-12 LAB — POCT URINALYSIS DIPSTICK OB
Bilirubin, UA: NEGATIVE
Blood, UA: NEGATIVE
Glucose, UA: NEGATIVE
Ketones, UA: POSITIVE
Nitrite, UA: NEGATIVE
Odor: NEGATIVE
POC,PROTEIN,UA: NEGATIVE
Spec Grav, UA: 1.02 (ref 1.010–1.025)
Urobilinogen, UA: 0.2 E.U./dL
pH, UA: 6.5 (ref 5.0–8.0)

## 2020-07-12 NOTE — Patient Instructions (Signed)
     OUTPATIENT FOLEY BULB INDUCTION OF LABOR:  Information Sheet for Mothers and Family               What's a Foley Bulb Induction?   A Foley bulb induction is a procedure where your provider inserts a catheter into your cervix. Once inside your womb, your provider inflates the balloon with a saline solution.  This puts pressure on your cervix and encourages dilation. The catheter falls out once your cervix dilates to 3-4 centimeters.  Your provider will likely use this method in conjunction with labor-inducing medications such as Pitocin. With any procedure, it's important that you know what to expect. The insertion of a Foley catheter can be a bit uncomfortable, and some women experience sharp pelvic pain. The pain may subside once the catheter is in place. You may experience some cramping when the Foley catheter is in place.  This is normal.     GO TO LABOR AND DELIVERY FOR THE FOLLOWING: Heavy vaginal bleeding Rupture of membranes (fluid that wets your underwear) Painful uterine contractions every 5 minutes or less Severe abdominal discomfort Decreased movement of the baby          

## 2020-07-12 NOTE — Progress Notes (Signed)
ROB

## 2020-07-12 NOTE — Progress Notes (Signed)
FOLEY BULB PROCEDURE NOTE:   I have verified that the patient is an appropriate candidate for outpatient foley bulb placement. Verbal consent was obtained for outpatient placement of foley bulp. She has no concerns at this time. A pre-procedure NST performed today was reviewed and was found to be reactive.    The patient was placed in the dorsal lithotomy position.  A vaginal examination was performed and a finger was placed at the base of the cervical os.  The cervix was noted to be 1 cm dilated  A foley catheter was advanced into the cervix slightly pass the internal cervical os.  The foley balloon was filled with 30 cc of normal saline.  Gentle traction was placed on the foley bulb, and the catheter was taped to the medial portion of the thigh.  The patient tolerated the procedure well.  A post-procedure NST was performed. Sterile technique was observed throughout.      NONSTRESS TEST INTERPRETATION  INDICATIONS: Foley bulb induction placement  FHR baseline: 130 bpm (pre-procedure) and 120  bpm (post procedure) RESULTS:Reactive x 2.  COMMENTS: Irregular contractions   PLAN: 1. To arrive at Labor and Delivery for scheduled induction of labor on 07/13/2020 at 0001.  She was given post-procedure instructions.

## 2020-07-13 ENCOUNTER — Inpatient Hospital Stay
Admission: EM | Admit: 2020-07-13 | Discharge: 2020-07-14 | DRG: 807 | Disposition: A | Discharge status: Home / Self Care | Payer: Commercial Managed Care - PPO | Attending: Certified Nurse Midwife | Admitting: Certified Nurse Midwife

## 2020-07-13 ENCOUNTER — Encounter: Payer: Self-pay | Admitting: Obstetrics and Gynecology

## 2020-07-13 ENCOUNTER — Other Ambulatory Visit: Payer: Commercial Managed Care - PPO

## 2020-07-13 DIAGNOSIS — O99824 Streptococcus B carrier state complicating childbirth: Secondary | ICD-10-CM | POA: Diagnosis not present

## 2020-07-13 DIAGNOSIS — F419 Anxiety disorder, unspecified: Secondary | ICD-10-CM | POA: Diagnosis present

## 2020-07-13 DIAGNOSIS — Z3490 Encounter for supervision of normal pregnancy, unspecified, unspecified trimester: Secondary | ICD-10-CM | POA: Diagnosis present

## 2020-07-13 DIAGNOSIS — Z20822 Contact with and (suspected) exposure to covid-19: Secondary | ICD-10-CM | POA: Diagnosis present

## 2020-07-13 DIAGNOSIS — O365990 Maternal care for other known or suspected poor fetal growth, unspecified trimester, not applicable or unspecified: Secondary | ICD-10-CM

## 2020-07-13 DIAGNOSIS — O365930 Maternal care for other known or suspected poor fetal growth, third trimester, not applicable or unspecified: Secondary | ICD-10-CM | POA: Diagnosis present

## 2020-07-13 DIAGNOSIS — Z3A38 38 weeks gestation of pregnancy: Secondary | ICD-10-CM | POA: Diagnosis not present

## 2020-07-13 DIAGNOSIS — B951 Streptococcus, group B, as the cause of diseases classified elsewhere: Secondary | ICD-10-CM | POA: Diagnosis present

## 2020-07-13 DIAGNOSIS — F32A Depression, unspecified: Secondary | ICD-10-CM | POA: Diagnosis present

## 2020-07-13 DIAGNOSIS — O99344 Other mental disorders complicating childbirth: Secondary | ICD-10-CM | POA: Diagnosis present

## 2020-07-13 LAB — CBC
HCT: 33.3 % — ABNORMAL LOW (ref 36.0–46.0)
Hemoglobin: 10.7 g/dL — ABNORMAL LOW (ref 12.0–15.0)
MCH: 28.2 pg (ref 26.0–34.0)
MCHC: 32.1 g/dL (ref 30.0–36.0)
MCV: 87.9 fL (ref 80.0–100.0)
Platelets: 258 10*3/uL (ref 150–400)
RBC: 3.79 MIL/uL — ABNORMAL LOW (ref 3.87–5.11)
RDW: 12.7 % (ref 11.5–15.5)
WBC: 7.5 10*3/uL (ref 4.0–10.5)
nRBC: 0 % (ref 0.0–0.2)

## 2020-07-13 LAB — RAPID HIV SCREEN (HIV 1/2 AB+AG)
HIV 1/2 Antibodies: NONREACTIVE
HIV-1 P24 Antigen - HIV24: NONREACTIVE

## 2020-07-13 LAB — COMPREHENSIVE METABOLIC PANEL
ALT: 12 U/L (ref 0–44)
AST: 30 U/L (ref 15–41)
Albumin: 3.1 g/dL — ABNORMAL LOW (ref 3.5–5.0)
Alkaline Phosphatase: 76 U/L (ref 38–126)
Anion gap: 11 (ref 5–15)
BUN: 8 mg/dL (ref 6–20)
CO2: 17 mmol/L — ABNORMAL LOW (ref 22–32)
Calcium: 8.6 mg/dL — ABNORMAL LOW (ref 8.9–10.3)
Chloride: 105 mmol/L (ref 98–111)
Creatinine, Ser: 0.56 mg/dL (ref 0.44–1.00)
GFR, Estimated: >60 mL/min (ref >60)
Glucose, Bld: 151 mg/dL — ABNORMAL HIGH (ref 70–99)
Potassium: 3.3 mmol/L — ABNORMAL LOW (ref 3.5–5.1)
Sodium: 133 mmol/L — ABNORMAL LOW (ref 135–145)
Total Bilirubin: 0.8 mg/dL (ref 0.3–1.2)
Total Protein: 6.2 g/dL — ABNORMAL LOW (ref 6.5–8.1)

## 2020-07-13 LAB — RESP PANEL BY RT-PCR (FLU A&B, COVID) ARPGX2
Influenza A by PCR: NEGATIVE
Influenza B by PCR: NEGATIVE
SARS Coronavirus 2 by RT PCR: NEGATIVE

## 2020-07-13 LAB — TYPE AND SCREEN
ABO/RH(D): A POS
Antibody Screen: NEGATIVE

## 2020-07-13 LAB — RPR: RPR Ser Ql: NONREACTIVE

## 2020-07-13 MED ORDER — LIDOCAINE HCL (PF) 1 % IJ SOLN
30.0000 mL | INTRAMUSCULAR | Status: DC | PRN
  Filled 2020-07-13: qty 30

## 2020-07-13 MED ORDER — DIPHENHYDRAMINE HCL 25 MG PO CAPS: 25.0000 mg | ORAL_CAPSULE | Freq: Four times a day (QID) | ORAL | Status: DC | PRN

## 2020-07-13 MED ORDER — SODIUM CHLORIDE 0.9% FLUSH: 3.0000 mL | Freq: Two times a day (BID) | INTRAVENOUS | Status: DC

## 2020-07-13 MED ORDER — BENZOCAINE-MENTHOL 20-0.5 % EX AERO
1.0000 "application " | INHALATION_SPRAY | CUTANEOUS | Status: DC | PRN
  Administered 2020-07-13: 1 via TOPICAL

## 2020-07-13 MED ORDER — SIMETHICONE 80 MG PO CHEW: 80.0000 mg | CHEWABLE_TABLET | ORAL | Status: DC | PRN

## 2020-07-13 MED ORDER — OXYTOCIN-SODIUM CHLORIDE 30-0.9 UT/500ML-% IV SOLN: 1.0000 m[IU]/min | INTRAVENOUS | Status: DC

## 2020-07-13 MED ORDER — SODIUM CHLORIDE 0.9 % IV SOLN
1.0000 g | INTRAVENOUS | Status: DC
  Administered 2020-07-13: 1 g via INTRAVENOUS
  Filled 2020-07-13: qty 1000

## 2020-07-13 MED ORDER — ONDANSETRON HCL 4 MG/2ML IJ SOLN: 4.0000 mg | INTRAMUSCULAR | Status: DC | PRN

## 2020-07-13 MED ORDER — IBUPROFEN 600 MG PO TABS
600.0000 mg | ORAL_TABLET | Freq: Four times a day (QID) | ORAL | Status: DC
  Administered 2020-07-13: 600 mg via ORAL
  Filled 2020-07-13: qty 1

## 2020-07-13 MED ORDER — MISOPROSTOL 50MCG HALF TABLET
ORAL_TABLET | ORAL | Status: AC
  Filled 2020-07-13: qty 1

## 2020-07-13 MED ORDER — SERTRALINE HCL 100 MG PO TABS
200.0000 mg | ORAL_TABLET | Freq: Every day | ORAL | Status: DC
  Filled 2020-07-13: qty 2

## 2020-07-13 MED ORDER — LACTATED RINGERS IV SOLN: 500.0000 mL | INTRAVENOUS | Status: DC | PRN

## 2020-07-13 MED ORDER — OXYTOCIN-SODIUM CHLORIDE 30-0.9 UT/500ML-% IV SOLN
2.5000 [IU]/h | INTRAVENOUS | Status: DC
  Filled 2020-07-13: qty 1000

## 2020-07-13 MED ORDER — SOD CITRATE-CITRIC ACID 500-334 MG/5ML PO SOLN: 30.0000 mL | ORAL | Status: DC | PRN

## 2020-07-13 MED ORDER — ACETAMINOPHEN 325 MG PO TABS
650.0000 mg | ORAL_TABLET | ORAL | Status: DC | PRN
Start: 2020-07-13 — End: 2020-07-13

## 2020-07-13 MED ORDER — BENZOCAINE-MENTHOL 20-0.5 % EX AERO
INHALATION_SPRAY | CUTANEOUS | Status: AC
  Filled 2020-07-13: qty 56

## 2020-07-13 MED ORDER — ACETAMINOPHEN 500 MG PO TABS
1000.0000 mg | ORAL_TABLET | Freq: Four times a day (QID) | ORAL | Status: DC | PRN
  Administered 2020-07-13: 1000 mg via ORAL
  Filled 2020-07-13: qty 2

## 2020-07-13 MED ORDER — ONDANSETRON HCL 4 MG/2ML IJ SOLN: 4.0000 mg | Freq: Four times a day (QID) | INTRAMUSCULAR | Status: DC | PRN

## 2020-07-13 MED ORDER — COCONUT OIL OIL: 1.0000 "application " | TOPICAL_OIL | Status: DC | PRN

## 2020-07-13 MED ORDER — AMMONIA AROMATIC IN INHA
RESPIRATORY_TRACT | Status: AC
  Filled 2020-07-13: qty 10

## 2020-07-13 MED ORDER — MISOPROSTOL 200 MCG PO TABS
ORAL_TABLET | ORAL | Status: AC
  Filled 2020-07-13: qty 4

## 2020-07-13 MED ORDER — SODIUM CHLORIDE 0.9 % IV SOLN: 250.0000 mL | INTRAVENOUS | Status: DC | PRN

## 2020-07-13 MED ORDER — SODIUM CHLORIDE 0.9% FLUSH: 3.0000 mL | INTRAVENOUS | Status: DC | PRN

## 2020-07-13 MED ORDER — SODIUM CHLORIDE 0.9 % IV SOLN
2.0000 g | Freq: Once | INTRAVENOUS | Status: AC
  Administered 2020-07-13: 2 g via INTRAVENOUS
  Filled 2020-07-13: qty 2000

## 2020-07-13 MED ORDER — ONDANSETRON HCL 4 MG PO TABS: 4.0000 mg | ORAL_TABLET | ORAL | Status: DC | PRN

## 2020-07-13 MED ORDER — OXYTOCIN 10 UNIT/ML IJ SOLN
10.0000 [IU] | Freq: Once | INTRAMUSCULAR | Status: AC
  Administered 2020-07-13: 10 [IU] via INTRAMUSCULAR

## 2020-07-13 MED ORDER — OXYTOCIN 10 UNIT/ML IJ SOLN
INTRAMUSCULAR | Status: AC
  Filled 2020-07-13: qty 2

## 2020-07-13 MED ORDER — SENNOSIDES-DOCUSATE SODIUM 8.6-50 MG PO TABS
2.0000 | ORAL_TABLET | ORAL | Status: DC
  Administered 2020-07-13 – 2020-07-14 (×2): 2 via ORAL
  Filled 2020-07-13 (×2): qty 2

## 2020-07-13 MED ORDER — DIBUCAINE (PERIANAL) 1 % EX OINT
1.0000 "application " | TOPICAL_OINTMENT | CUTANEOUS | Status: DC | PRN
  Administered 2020-07-13: 1 via RECTAL
  Filled 2020-07-13: qty 28

## 2020-07-13 MED ORDER — TERBUTALINE SULFATE 1 MG/ML IJ SOLN
0.2500 mg | Freq: Once | INTRAMUSCULAR | Status: DC | PRN
Start: 2020-07-13 — End: 2020-07-13

## 2020-07-13 MED ORDER — MISOPROSTOL 50MCG HALF TABLET
50.0000 ug | ORAL_TABLET | ORAL | Status: DC
Start: 2020-07-13 — End: 2020-07-13
  Administered 2020-07-13 (×2): 50 ug via ORAL
  Filled 2020-07-13: qty 1

## 2020-07-13 MED ORDER — PRENATAL MULTIVITAMIN CH
1.0000 | ORAL_TABLET | Freq: Every day | ORAL | Status: DC
  Administered 2020-07-13 – 2020-07-14 (×2): 1 via ORAL
  Filled 2020-07-13 (×2): qty 1

## 2020-07-13 MED ORDER — ZOLPIDEM TARTRATE 5 MG PO TABS: 5.0000 mg | ORAL_TABLET | Freq: Every evening | ORAL | Status: DC | PRN

## 2020-07-13 MED ORDER — OXYTOCIN BOLUS FROM INFUSION
333.0000 mL | Freq: Once | INTRAVENOUS | Status: DC
Start: 2020-07-13 — End: 2020-07-13

## 2020-07-13 MED ORDER — WITCH HAZEL-GLYCERIN EX PADS
1.0000 "application " | MEDICATED_PAD | CUTANEOUS | Status: DC | PRN
  Administered 2020-07-13: 1 via TOPICAL
  Filled 2020-07-13: qty 100

## 2020-07-13 MED ORDER — IBUPROFEN 600 MG PO TABS
600.0000 mg | ORAL_TABLET | Freq: Four times a day (QID) | ORAL | Status: DC
  Administered 2020-07-13 (×2): 600 mg via ORAL
  Filled 2020-07-13 (×4): qty 1

## 2020-07-13 MED ORDER — LACTATED RINGERS IV SOLN: INTRAVENOUS | Status: DC

## 2020-07-13 NOTE — Plan of Care (Signed)
Patient delivered baby at 46. Patient recovering well postpartum and bleeding and fundal assessment WNL. Pain being managed with Ibuprofen and Tylenol. Patient reports breastfeeding going well. Plan to transfer to mother baby for couplet care.

## 2020-07-13 NOTE — Lactation Note (Signed)
This note was copied from a baby's chart. Lactation Consultation Note  Patient Name: Regina Gray PIRJJ'O Date: 07/13/2020 Reason for consult: Initial assessment Age:32 hours  Lactation to the room for initial visit. Mother is holding the baby, swaddled in cradle on the right. Stated she attempted to feed but baby does not want too. Encouraged feeding on demand and with cues. If baby is not cueing encouraged hand expression and skin to skin. Taught proper technique for hand expression and spoon feeding. LC and Mother expressed and fed to baby. Baby tolerated spoon feed well. Baby was left skin to skin with Father and she began to cue again. LC encouraged a laid back position to attempt latch. Baby was able to latch on her own and had a rhythmic sucking pattern with swallows noted. Encouraged 8 or more attempts in the first 24 hours and 8 or more good feeds after 24 HOL. Reviewed appropriate diapers for days of life and How to know your baby is getting enough to eat. Reviewed "Understanding Postpartum and Newborn Care" booklet at bedside. [facility] # left on board, encouraged to call for any assistance. Parents have no further questions at this time.   Maternal Data Has patient been taught Hand Expression?: Yes Does the patient have breastfeeding experience prior to this delivery?: Yes How long did the patient breastfeed?: 18 months  Feeding Mother's Current Feeding Choice: Breast Milk  LATCH Score Latch: Repeated attempts needed to sustain latch, nipple held in mouth throughout feeding, stimulation needed to elicit sucking reflex.  Audible Swallowing: A few with stimulation  Type of Nipple: Everted at rest and after stimulation  Comfort (Breast/Nipple): Soft / non-tender  Hold (Positioning): Assistance needed to correctly position infant at breast and maintain latch.  LATCH Score: 7   Interventions Interventions: Breast feeding basics reviewed;Assisted with latch;Hand express;Adjust  position;Position options;Expressed milk;Education  Discharge Pump: Personal (Medela from previous child)  Consult Status Consult Status: Follow-up Date: 07/14/20 Follow-up type: Call as needed    Carys Malina D Laddie Math 07/13/2020, 3:21 PM

## 2020-07-13 NOTE — H&P (Signed)
Patient ID: Jashae Wiggs, female   DOB: 04/18/1991, 30 y.o.   MRN: [id number]  Obstetric History and Physical  Barbarann Kelly is a 30 y.o. G3P1011 with IUP at [redacted]w[redacted]d presenting for induction of labor due to fetal growth restriction as recommend by MFM Ma Rings, MD) on 07/08/2020.   Patient states she has been having irregular contractions, none vaginal bleeding, intact membranes, with active fetal movement.    Denies difficulty breathing or respiratory distress, chest pain, abdominal pain, dysuria, and leg pain or swelling.   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 Complication of anesthesia    woke up during surgery    Past Surgical History:  Procedure Laterality Date  . ANTERIOR CRUCIATE LIGAMENT REPAIR    . APPENDECTOMY    . WISDOM TOOTH EXTRACTION      OB History  Gravida Para Term Preterm AB Living  3 1 1   1 1   SAB IAB Ectopic Multiple Live Births  1     0 1    # Outcome Date GA Lbr Len/2nd Weight Sex Delivery Anes PTL Lv  3 Current           2 Term 09/25/18 [redacted]w[redacted]d 04:43 / 01:19 3810 g M Vag-Spont EPI  LIV     Birth Comments: middle and pointer finger fused together on left hand  1 SAB 2014            Social History   Socioeconomic History  . Marital status: Married    Spouse name: Not on file  . Number of children: Not on file  . Years  of education: Not on file  . Highest education level: Not on file  Occupational History  . Not on file  Tobacco Use  . Smoking status: Never Smoker  . Smokeless tobacco: Never Used  Vaping Use  . Vaping Use: Never used  Substance and Sexual Activity  . Alcohol use: Not Currently  . Drug use: No  . Sexual activity: Not Currently    Birth control/protection: None  Other Topics Concern  . Not on file  Social History Narrative  . Not on file   Social Determinants of Health   Financial Resource Strain: Not on file  Food Insecurity: Not on file  Transportation Needs: Not on file  Physical Activity: Not on file  Stress: Not on file  Social Connections: Not on file    Family History  Problem Relation Age of Onset  . Heart attack Maternal Grandfather   . Healthy Mother   . Healthy Father   . Breast cancer Neg Hx   . Ovarian cancer Neg Hx   . Colon cancer Neg Hx   . Diabetes Neg Hx     Medications Prior to Admission  Medication Sig Dispense Refill Last Dose  . acetaminophen (TYLENOL) 500 MG tablet Take 2 tablets (1,000 mg total) by mouth every 6 (  six) hours as needed for fever or headache. 30 tablet 0 07/12/2020 at Unknown time  . famotidine (PEPCID) 20 MG tablet Take 1 tablet (20 mg total) by mouth daily. 30 tablet 2 07/13/2020 at Unknown time  . LORazepam (ATIVAN) 1 MG tablet Take 1 tablet (1 mg total) by mouth at bedtime as needed for anxiety. 8 tablet 0 07/13/2020 at Unknown time  . Magnesium 500 MG CAPS Take by mouth.   07/13/2020 at Unknown time  . metoCLOPramide (REGLAN) 10 MG tablet Take 10 mg by mouth 3 (three) times daily as needed.   07/13/2020 at Unknown time  . ondansetron (ZOFRAN ODT) 8 MG disintegrating tablet Take 1 tablet (8 mg total) by mouth every 8 (eight) hours as needed for nausea or vomiting. 84 tablet 1 07/13/2020 at Unknown time  . sertraline (ZOLOFT) 100 MG tablet Take 2 tablets (200 mg total) by mouth daily. 60 tablet 1 07/13/2020 at Unknown time  .  omeprazole (PRILOSEC) 20 MG capsule Take 20 mg by mouth daily. (Patient not taking: Reported on 07/13/2020)   Not Taking at Unknown time    Allergies  Allergen Reactions  . Codeine   . Hydromorphone     Other reaction(s): Vomiting  . Oxycodone     Other reaction(s): Vomiting  . Sulfa Antibiotics     Review of Systems: Negative except for what is mentioned in HPI.  Physical Exam:  Temp:  [98 F (36.7 C)] 98 F (36.7 C) (03/22 0034) Pulse Rate:  [85-91] 90 (03/22 0105) Resp:  [16] 16 (03/22 0034) BP: (119-145)/(75-84) 131/84 (03/22 0105) Weight:  [85.3 kg-85.6 kg] 85.3 kg (03/22 0034)  GENERAL: Well-developed, well-nourished female in no acute distress.   LUNGS: Clear to auscultation bilaterally.   HEART: Regular rate and rhythm.  ABDOMEN: Soft, nontender, nondistended, gravid.  EXTREMITIES: Nontender, no edema, 2+ distal pulses.  Cervical Exam: Dilation: 4 Effacement (%): 50 Station: -2 Exam by:: Willodean Rosenthal CNM  FHR Category I  Contractions: Occasional, soft resting tone   Pertinent Labs/Studies:   Results for orders placed or performed during the hospital encounter of 07/13/20 (from the past 24 hour(s))  CBC     Status: Abnormal   Collection Time: 07/13/20 12:25 AM  Result Value Ref Range   WBC 7.5 4.0 - 10.5 K/uL   RBC 3.79 (L) 3.87 - 5.11 MIL/uL   Hemoglobin 10.7 (L) 12.0 - 15.0 g/dL   HCT 14.4 (L) 31.5 - 40.0 %   MCV 87.9 80.0 - 100.0 fL   MCH 28.2 26.0 - 34.0 pg   MCHC 32.1 30.0 - 36.0 g/dL   RDW 86.7 61.9 - 50.9 %   Platelets 258 150 - 400 K/uL   nRBC 0.0 0.0 - 0.2 %  Type and screen     Status: None (Preliminary result)   Collection Time: 07/13/20 12:25 AM  Result Value Ref Range   ABO/RH(D) PENDING    Antibody Screen PENDING    Sample Expiration      07/16/2020,2359 Performed at [facility], [address] [place] [place], [place] [address]     Assessment : Garnell Begeman is a 30 y.o. G3P1011 at [redacted]w[redacted]d being admitted for induction  of labor due to fetal growth restriction, Rh positive, GBS negative, History of anxiety and depression  FHR Category I  Plan:  Admit to birthing suites, see orders.   Induction/Augmentation as needed, per protocol.  Pan: Hopeful for vaginal birth.   Reviewed red flag symptoms and when to call.  Dr. Valentino Saxon notified of admission and plan of care.    Gunnar Bulla, CNM [facility], [facility] 07/13/20 1:30 AM

## 2020-07-13 NOTE — Progress Notes (Signed)
RN marked hematoma with patient's consent per CNM verbal order. CNM notified that hematoma appeared larger than at delivery and order given to reassess at next fundal assessment.

## 2020-07-14 LAB — CBC
HCT: 34.3 % — ABNORMAL LOW (ref 36.0–46.0)
Hemoglobin: 11.2 g/dL — ABNORMAL LOW (ref 12.0–15.0)
MCH: 28.7 pg (ref 26.0–34.0)
MCHC: 32.7 g/dL (ref 30.0–36.0)
MCV: 87.9 fL (ref 80.0–100.0)
Platelets: 229 10*3/uL (ref 150–400)
RBC: 3.9 MIL/uL (ref 3.87–5.11)
RDW: 13.2 % (ref 11.5–15.5)
WBC: 4.2 10*3/uL (ref 4.0–10.5)
nRBC: 0 % (ref 0.0–0.2)

## 2020-07-14 LAB — SURGICAL PATHOLOGY

## 2020-07-14 MED ORDER — IBUPROFEN 600 MG PO TABS
600.0000 mg | ORAL_TABLET | Freq: Four times a day (QID) | ORAL | 0 refills | Status: DC
Start: 2020-07-14 — End: 2020-09-09
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
## 2020-07-14 NOTE — Discharge Instructions (Signed)
Breastfeeding  Choosing to breastfeed is one of the best decisions you can make for yourself and your baby. A change in hormones during pregnancy causes your breasts to make breast milk in your milk-producing glands. Hormones prevent breast milk from being released before your baby is born. They also prompt milk flow after birth. Once breastfeeding has begun, thoughts of your baby, as well as his or her sucking or crying, can stimulate the release of milk from your milk-producing glands. Benefits of breastfeeding Research shows that breastfeeding offers many health benefits for infants and mothers. It also offers a cost-free and convenient way to feed your baby. For your baby  Your first milk (colostrum) helps your baby's digestive system to function better.  Special cells in your milk (antibodies) help your baby to fight off infections.  Breastfed babies are less likely to develop asthma, allergies, obesity, or type 2 diabetes. They are also at lower risk for sudden infant death syndrome (SIDS).  Nutrients in breast milk are better able to meet your baby's needs compared to infant formula.  Breast milk improves your baby's brain development. For you  Breastfeeding helps to create a very special bond between you and your baby.  Breastfeeding is convenient. Breast milk costs nothing and is always available at the correct temperature.  Breastfeeding helps to burn calories. It helps you to lose the weight that you gained during pregnancy.  Breastfeeding makes your uterus return faster to its size before pregnancy. It also slows bleeding (lochia) after you give birth.  Breastfeeding helps to lower your risk of developing type 2 diabetes, osteoporosis, rheumatoid arthritis, cardiovascular disease, and breast, ovarian, uterine, and endometrial cancer later in life. Breastfeeding basics Starting breastfeeding  Find a comfortable place to sit or lie down, with your neck and back  well-supported.  Place a pillow or a rolled-up blanket under your baby to bring him or her to the level of your breast (if you are seated). Nursing pillows are specially designed to help support your arms and your baby while you breastfeed.  Make sure that your baby's tummy (abdomen) is facing your abdomen.  Gently massage your breast. With your fingertips, massage from the outer edges of your breast inward toward the nipple. This encourages milk flow. If your milk flows slowly, you may need to continue this action during the feeding.  Support your breast with 4 fingers underneath and your thumb above your nipple (make the letter "C" with your hand). Make sure your fingers are well away from your nipple and your baby's mouth.  Stroke your baby's lips gently with your finger or nipple.  When your baby's mouth is open wide enough, quickly bring your baby to your breast, placing your entire nipple and as much of the areola as possible into your baby's mouth. The areola is the colored area around your nipple. ? More areola should be visible above your baby's upper lip than below the lower lip. ? Your baby's lips should be opened and extended outward (flanged) to ensure an adequate, comfortable latch. ? Your baby's tongue should be between his or her lower gum and your breast.  Make sure that your baby's mouth is correctly positioned around your nipple (latched). Your baby's lips should create a seal on your breast and be turned out (everted).  It is common for your baby to suck about 2-3 minutes in order to start the flow of breast milk. Latching Teaching your baby how to latch onto your breast properly is  very important. An improper latch can cause nipple pain, decreased milk supply, and poor weight gain in your baby. Also, if your baby is not latched onto your nipple properly, he or she may swallow some air during feeding. This can make your baby fussy. Burping your baby when you switch breasts  during the feeding can help to get rid of the air. However, teaching your baby to latch on properly is still the best way to prevent fussiness from swallowing air while breastfeeding. Signs that your baby has successfully latched onto your nipple  Silent tugging or silent sucking, without causing you pain. Infant's lips should be extended outward (flanged).  Swallowing heard between every 3-4 sucks once your milk has started to flow (after your let-down milk reflex occurs).  Muscle movement above and in front of his or her ears while sucking. Signs that your baby has not successfully latched onto your nipple  Sucking sounds or smacking sounds from your baby while breastfeeding.  Nipple pain. If you think your baby has not latched on correctly, slip your finger into the corner of your baby's mouth to break the suction and place it between your baby's gums. Attempt to start breastfeeding again. Signs of successful breastfeeding Signs from your baby  Your baby will gradually decrease the number of sucks or will completely stop sucking.  Your baby will fall asleep.  Your baby's body will relax.  Your baby will retain a small amount of milk in his or her mouth.  Your baby will let go of your breast by himself or herself. Signs from you  Breasts that have increased in firmness, weight, and size 1-3 hours after feeding.  Breasts that are softer immediately after breastfeeding.  Increased milk volume, as well as a change in milk consistency and color by the fifth day of breastfeeding.  Nipples that are not sore, cracked, or bleeding. Signs that your baby is getting enough milk  Wetting at least 1-2 diapers during the first 24 hours after birth.  Wetting at least 5-6 diapers every 24 hours for the first week after birth. The urine should be clear or pale yellow by the age of 5 days.  Wetting 6-8 diapers every 24 hours as your baby continues to grow and develop.  At least 3 stools in  a 24-hour period by the age of 5 days. The stool should be soft and yellow.  At least 3 stools in a 24-hour period by the age of 7 days. The stool should be seedy and yellow.  No loss of weight greater than 10% of birth weight during the first 3 days of life.  Average weight gain of 4-7 oz (113-198 g) per week after the age of 4 days.  Consistent daily weight gain by the age of 5 days, without weight loss after the age of 2 weeks. After a feeding, your baby may spit up a small amount of milk. This is normal. Breastfeeding frequency and duration Frequent feeding will help you make more milk and can prevent sore nipples and extremely full breasts (breast engorgement). Breastfeed when you feel the need to reduce the fullness of your breasts or when your baby shows signs of hunger. This is called "breastfeeding on demand." Signs that your baby is hungry include:  Increased alertness, activity, or restlessness.  Movement of the head from side to side.  Opening of the mouth when the corner of the mouth or cheek is stroked (rooting).  Increased sucking sounds, smacking lips, cooing,   sighing, or squeaking.  Hand-to-mouth movements and sucking on fingers or hands.  Fussing or crying. Avoid introducing a pacifier to your baby in the first 4-6 weeks after your baby is born. After this time, you may choose to use a pacifier. Research has shown that pacifier use during the first year of a baby's life decreases the risk of sudden infant death syndrome (SIDS). Allow your baby to feed on each breast as long as he or she wants. When your baby unlatches or falls asleep while feeding from the first breast, offer the second breast. Because newborns are often sleepy in the first few weeks of life, you may need to awaken your baby to get him or her to feed. Breastfeeding times will vary from baby to baby. However, the following rules can serve as a guide to help you make sure that your baby is properly  fed:  Newborns (babies 9 weeks of age or younger) may breastfeed every 1-3 hours.  Newborns should not go without breastfeeding for longer than 3 hours during the day or 5 hours during the night.  You should breastfeed your baby a minimum of 8 times in a 24-hour period. Breast milk pumping Pumping and storing breast milk allows you to make sure that your baby is exclusively fed your breast milk, even at times when you are unable to breastfeed. This is especially important if you go back to work while you are still breastfeeding, or if you are not able to be present during feedings. Your lactation consultant can help you find a method of pumping that works best for you and give you guidelines about how long it is safe to store breast milk.      Caring for your breasts while you breastfeed Nipples can become dry, cracked, and sore while breastfeeding. The following recommendations can help keep your breasts moisturized and healthy:  Avoid using soap on your nipples.  Wear a supportive bra designed especially for nursing. Avoid wearing underwire-style bras or extremely tight bras (sports bras).  Air-dry your nipples for 3-4 minutes after each feeding.  Use only cotton bra pads to absorb leaked breast milk. Leaking of breast milk between feedings is normal.  Use lanolin on your nipples after breastfeeding. Lanolin helps to maintain your skin's normal moisture barrier. Pure lanolin is not harmful (not toxic) to your baby. You may also hand express a few drops of breast milk and gently massage that milk into your nipples and allow the milk to air-dry. In the first few weeks after giving birth, some women experience breast engorgement. Engorgement can make your breasts feel heavy, warm, and tender to the touch. Engorgement peaks within 3-5 days after you give birth. The following recommendations can help to ease engorgement:  Completely empty your breasts while breastfeeding or pumping. You may  want to start by applying warm, moist heat (in the shower or with warm, water-soaked hand towels) just before feeding or pumping. This increases circulation and helps the milk flow. If your baby does not completely empty your breasts while breastfeeding, pump any extra milk after he or she is finished.  Apply ice packs to your breasts immediately after breastfeeding or pumping, unless this is too uncomfortable for you. To do this: ? Put ice in a plastic bag. ? Place a towel between your skin and the bag. ? Leave the ice on for 20 minutes, 2-3 times a day.  Make sure that your baby is latched on and positioned properly while breastfeeding.  If engorgement persists after 48 hours of following these recommendations, contact your health care provider or a Advertising copywriterlactation consultant. Overall health care recommendations while breastfeeding  Eat 3 healthy meals and 3 snacks every day. Well-nourished mothers who are breastfeeding need an additional 450-500 calories a day. You can meet this requirement by increasing the amount of a balanced diet that you eat.  Drink enough water to keep your urine pale yellow or clear.  Rest often, relax, and continue to take your prenatal vitamins to prevent fatigue, stress, and low vitamin and mineral levels in your body (nutrient deficiencies).  Do not use any products that contain nicotine or tobacco, such as cigarettes and e-cigarettes. Your baby may be harmed by chemicals from cigarettes that pass into breast milk and exposure to secondhand smoke. If you need help quitting, ask your health care provider.  Avoid alcohol.  Do not use illegal drugs or marijuana.  Talk with your health care provider before taking any medicines. These include over-the-counter and prescription medicines as well as vitamins and herbal supplements. Some medicines that may be harmful to your baby can pass through breast milk.  It is possible to become pregnant while breastfeeding. If birth  control is desired, ask your health care provider about options that will be safe while breastfeeding your baby. Where to find more information: [employer]: www.llli.org Contact a health care provider if:  You feel like you want to stop breastfeeding or have become frustrated with breastfeeding.  Your nipples are cracked or bleeding.  Your breasts are red, tender, or warm.  You have: ? Painful breasts or nipples. ? A swollen area on either breast. ? A fever or chills. ? Nausea or vomiting. ? Drainage other than breast milk from your nipples.  Your breasts do not become full before feedings by the fifth day after you give birth.  You feel sad and depressed.  Your baby is: ? Too sleepy to eat well. ? Having trouble sleeping. ? More than 391 week old and wetting fewer than 6 diapers in a 24-hour period. ? Not gaining weight by 635 days of age.  Your baby has fewer than 3 stools in a 24-hour period.  Your baby's skin or the white parts of his or her eyes become yellow. Get help right away if:  Your baby is overly tired (lethargic) and does not want to wake up and feed.  Your baby develops an unexplained fever. Summary  Breastfeeding offers many health benefits for infant and mothers.  Try to breastfeed your infant when he or she shows early signs of hunger.  Gently tickle or stroke your baby's lips with your finger or nipple to allow the baby to open his or her mouth. Bring the baby to your breast. Make sure that much of the areola is in your baby's mouth. Offer one side and burp the baby before you offer the other side.  Talk with your health care provider or lactation consultant if you have questions or you face problems as you breastfeed. This information is not intended to replace advice given to you by your health care provider. Make sure you discuss any questions you have with your health care provider. Document Revised: 07/05/2017 Document Reviewed:  05/12/2016 Elsevier Patient Education  2021 [employer] Inc.   Breastfeeding Tips for a Good Latch Breastfeeding can be challenging, especially during the first few weeks after childbirth. It is normal to have some problems when you start to breastfeed your new baby,  even if you have breastfed before. Latching is an important part of having a good breastfeeding experience. This refers to how your baby's mouth attaches to your nipple to breastfeed. Your baby may have trouble latching due to:  Poor positioning.  Nipple confusion. This can occur if you introduce a bottle or pacifier too early.  Abnormalities in your baby's mouth, tongue, or lips. This includes conditions like tongue-tie or cleft lip.  The shape of your nipples, such as nipples that are flat or turned in (inverted).  Your baby being born early (prematurely). Small babies often have a weak suck.  Breasts becoming overfilled with milk (engorged breasts). Breasts that are too full can make a latch difficult. If breasts are engorged, express a little milk to help soften the breast. Work with a breastfeeding specialist (Advertising copywriter) to find positions and strategies that will help make sure your baby has a good latch. How does this affect me? A poor latch may cause you to have problems such as:  Cracked or sore nipples.  Engorged breasts.  Plugged milk ducts.  Low milk supply.  Breast inflammation or infection. How does this affect my baby? A poor latch may cause your baby to not be able to feed effectively. As a result, he or she may have trouble gaining weight. Follow these instructions at home: How to position your baby  Find a comfortable place to sit or lie down, with your neck and back well supported.  If you are seated, place a pillow or rolled-up blanket under your baby to bring him or her to the level of your breast.  Make sure that your baby's belly is facing your abdomen.  Try different positions to  find one that works best for you and your baby. How to help your baby latch To begin each breastfeeding session, you may find it helpful to gently massage your breast. With your fingertips, massage from your chest wall toward your nipple in a circular motion. This encourages milk flow. If your milk flows slowly, you may need to continue this action during feeding. Position your breast for an ideal latch. Support your breast with four fingers underneath and your thumb above your nipple. Keep your fingers away from your nipple and your baby's mouth. To help your baby latch, follow these steps: 1. Stroke your baby's lips gently with your finger or nipple. 2. When your baby's mouth is open wide enough, quickly bring your baby to your breast and place your entire nipple, and as much of the areolaas possible, into your baby's mouth. The areola is the colored area around your nipple. 3. Your baby's tongue should be between his or her lower gum and your breast. 4. More areola should be visible above your baby's upper lip than below the lower lip. 5. When your baby starts sucking, you will feel a gentle pull on your nipple, but you should not feel pain. Be patient. It is common for a baby to suck for about 2-3 minutes to start the flow of breast milk. 6. Make sure that your baby's mouth is correctly positioned around your nipple. Your baby's lips should create a seal on your breast and be turned outward (everted).   General instructions  Look for the following signs that your baby has successfully latched on to your nipple: ? The baby is quietly tugging or quietly sucking without causing you pain. ? You hear the baby swallow after every 3 or 4 sucks. ? You see muscle movement  above and in front of the baby's ears while he or she is sucking.  Be aware of these signs that your baby has not successfully latched on to your nipple: ? The baby makes sucking sounds or smacking sounds while nursing. ? You have  nipple pain.  If your baby is not latched well, insert your little finger between your baby's gums and your nipple to break the seal. Then try to help your baby latch again. Contact a health care provider if:  You have cracking or soreness in your nipples that lasts longer than 1 week.  You have nipple pain. Nipple cracking and soreness are common during the first week after birth, but nipple pain is never normal.  You have breast engorgement that does not improve after 48-72 hours.  You have a plugged milk duct and a fever.  You follow suggestions for a good latch but you continue to have problems or concerns.  You have pus-like discharge coming from your breast.  Your baby is not gaining weight or loses weight. Summary  Many common breastfeeding challenges are caused by poor latching.  Latching refers to how your baby's mouth attaches to your nipple during breastfeeding.  If problems continue, seek help from a lactation consultant in your community. He or she can assess you and your baby for any latching problems.  The lactation consultant can work with you to develop a plan to overcome any breastfeeding challenges. This information is not intended to replace advice given to you by your health care provider. Make sure you discuss any questions you have with your health care provider. Document Revised: 10/08/2019 Document Reviewed: 10/08/2019 Elsevier Patient Education  2021 [employer] Inc.   Postpartum Baby Blues The postpartum period begins right after the birth of a baby. During this time, there is often joy and excitement. It is also a time of many changes in the life of the parents. A mother may feel happy one minute and sad or stressed the next. These feelings of sadness, called the baby blues, usually happen in the period right after the baby is born and go away within a week or two. What are the causes? The exact cause of this condition is not known. Changes in hormone  levels after childbirth are believed to trigger some of the symptoms. Other factors that can play a role in these mood changes include:  Lack of sleep.  Stressful life events, such as financial problems, caring for a loved one, or death of a loved one.  Genetics. What are the signs or symptoms? Symptoms of this condition include:  Changes in mood, such as going from extreme happiness to sadness.  A decrease in concentration.  Difficulty sleeping.  Crying spells and tearfulness.  Loss of appetite.  Irritability.  Anxiety. If these symptoms last for more than 2 weeks or become more severe, you may have postpartum depression. How is this diagnosed? This condition is diagnosed based on an evaluation of your symptoms. Your health care provider may use a screening tool that includes a list of questions to help identify a person with the baby blues or postpartum depression. How is this treated? The baby blues usually go away on their own in 1-2 weeks. Social support is often what is needed. You will be encouraged to get adequate sleep and rest. Follow these instructions at home: Lifestyle  Get as much rest as you can. Take a nap when the baby sleeps.  Exercise regularly as told by your health  care provider. Some women find yoga and walking to be helpful.  Eat a balanced and nourishing diet. This includes plenty of fruits and vegetables, whole grains, and lean proteins.  Do little things that you enjoy. Take a bubble bath, read your favorite magazine, or listen to your favorite music.  Avoid alcohol.  Ask for help with household chores, cooking, grocery shopping, or running errands. Do not try to do everything yourself. Consider hiring a postpartum doula to help. This is a professional who specializes in providing support to new mothers.  Try not to make any major life changes during pregnancy or right after giving birth. This can add stress.      General instructions  Talk  to people close to you about how you are feeling. Get support from your partner, family members, friends, or other new moms. You may want to join a support group.  Find ways to manage stress. This may include: ? Writing your thoughts and feelings in a journal. ? Spending time outside. ? Spending time with people who make you laugh.  Try to stay positive in how you think. Think about the things you are grateful for.  Take over-the-counter and prescription medicines only as told by your health care provider.  Let your health care provider know if you have any concerns.  Keep all postpartum visits. This is important. Contact a health care provider if:  Your baby blues do not go away after 2 weeks. Get help right away if:  You have thoughts of taking your own life (suicidal thoughts), or of harming your baby or someone else.  You see or hear things that are not there (hallucinations). If you ever feel like you may hurt yourself or others, or have thoughts about taking your own life, get help right away. Go to your nearest emergency department or:  Call your local emergency services (911 in the [place]  Call a suicide crisis helpline, such as the National Suicide Prevention Lifeline, at 9490913613. This is open 24 hours a day in the [place]  Text the Crisis Text Line at (610)770-3535 (in the [place] Summary  After giving birth, you may feel happy one minute and sad or stressed the next. Feelings of sadness that happen right after the baby is born and go away after a week or two are called the baby blues.  You can manage the baby blues by getting enough rest, eating a healthy diet, exercising, spending time with supportive people, and finding ways to manage stress.  If feelings of sadness and stress last longer than 2 weeks or get in the way of caring for your baby, talk with your health care provider. This may mean you have postpartum depression. This information is not intended to replace  advice given to you by your health care provider. Make sure you discuss any questions you have with your health care provider. Document Revised: 10/03/2019 Document Reviewed: 10/03/2019 Elsevier Patient Education  2021 [employer] Inc.   Postpartum Care After Vaginal Delivery The following information offers guidance about how to care for yourself from the time you deliver your baby to 6-12 weeks after delivery (postpartum period). If you have problems or questions, contact your health care provider for more specific instructions. Follow these instructions at home: Vaginal bleeding  It is normal to have vaginal bleeding (lochia) after delivery. Wear a sanitary pad for bleeding and discharge. ? During the first week after delivery, the amount and appearance of lochia is often similar to a  menstrual period. ? Over the next few weeks, it will gradually decrease to a dry, yellow-brown discharge. ? For most women, lochia stops completely by 4-6 weeks after delivery, but can vary.  Change your sanitary pads frequently. Watch for any changes in your flow, such as: ? A sudden increase in volume. ? A change in color. ? Large blood clots.  If you pass a blood clot from your vagina, save it and call your health care provider. Do not flush blood clots down the toilet before talking with your health care provider.  Do not use tampons or douches until your health care provider approves.  If you are not breastfeeding, your period should return 6-8 weeks after delivery. If you are feeding your baby breast milk only, your period may not return until you stop breastfeeding. Perineal care  Keep the area between the vagina and the anus (perineum) clean and dry. Use medicated pads and pain-relieving sprays and creams as directed.  If you had a surgical cut in the perineum (episiotomy) or a tear, check the area for signs of infection until you are healed. Check for: ? More redness, swelling, or pain. ? Fluid  or blood coming from the cut or tear. ? Warmth. ? Pus or a bad smell.  You may be given a squirt bottle to use instead of wiping to clean the perineum area after you use the bathroom. Pat the area gently to dry it.  To relieve pain caused by an episiotomy, a tear, or swollen veins in the anus (hemorrhoids), take a warm sitz bath 2-3 times a day. In a sitz bath, the warm water should only come up to your hips and cover your buttocks.   Breast care  In the first few days after delivery, your breasts may feel heavy, full, and uncomfortable (breast engorgement). Milk may also leak from your breasts. Ask your health care provider about ways to help relieve the discomfort.  If you are breastfeeding: ? Wear a bra that supports your breasts and fits well. Use breast pads to absorb milk that leaks. ? Keep your nipples clean and dry. Apply creams and ointments as told. ? You may have uterine contractions every time you breastfeed for up to several weeks after delivery. This helps your uterus return to its normal size. ? If you have any problems with breastfeeding, notify your health care provider or lactation consultant.  If you are not breastfeeding: ? Avoid touching your breasts. Do not squeeze out (express) milk. Doing this can make your breasts produce more milk. ? Wear a good-fitting bra and use cold packs to help with swelling. Intimacy and sexuality  Ask your health care provider when you can engage in sexual activity. This may depend upon: ? Your risk of infection. ? How fast you are healing. ? Your comfort and desire to engage in sexual activity.  You are able to get pregnant after delivery, even if you have not had your period. Talk with your health care provider about methods of birth control (contraception) or family planning if you desire future pregnancies. Medicines  Take over-the-counter and prescription medicines only as told by your health care provider.  Take an  over-the-counter stool softener to help ease bowel movements as told by your health care provider.  If you were prescribed an antibiotic medicine, take it as told by your health care provider. Do not stop taking the antibiotic even if you start to feel better.  Review all previous and current prescriptions  to check for possible transfer into breast milk. Activity  Gradually return to your normal activities as told by your health care provider.  Rest as much as possible. Nap while your baby is sleeping. Eating and drinking  Drink enough fluid to keep your urine pale yellow.  To help prevent or relieve constipation, eat high-fiber foods every day.  Choose healthy eating to support breastfeeding or weight loss goals.  Take your prenatal vitamins until your health care provider tells you to stop.   General tips/recommendations  Do not use any products that contain nicotine or tobacco. These products include cigarettes, chewing tobacco, and vaping devices, such as e-cigarettes. If you need help quitting, ask your health care provider.  Do not drink alcohol, especially if you are breastfeeding.  Do not take medications or drugs that are not prescribed to you, especially if you are breastfeeding.  Visit your health care provider for a postpartum checkup within the first 3-6 weeks after delivery.  Complete a comprehensive postpartum visit no later than 12 weeks after delivery.  Keep all follow-up visits for you and your baby. Contact a health care provider if:  You feel unusually sad or worried.  Your breasts become red, painful, or hard.  You have a fever or other signs of an infection.  You have bleeding that is soaking through one pad an hour or you have blood clots.  You have a severe headache that doesn't go away or you have vision changes.  You have nausea and vomiting and are unable to eat or drink anything for 24 hours. Get help right away if:  You have chest pain or  difficulty breathing.  You have sudden, severe leg pain.  You faint or have a seizure.  You have thoughts about hurting yourself or your baby. If you ever feel like you may hurt yourself or others, or have thoughts about taking your own life, get help right away. Go to your nearest emergency department or:  Call your local emergency services (911 in the [place]  The National Suicide Prevention Lifeline at (414) 496-0844. This suicide crisis helpline is open 24 hours a day.  Text the Crisis Text Line at 647-251-0215 (in the [place] Summary  The period of time after you deliver your newborn up to 6-12 weeks after delivery is called the postpartum period.  Keep all follow-up visits for you and your baby.  Review all previous and current prescriptions to check for possible transfer into breast milk.  Contact a health care provider if you feel unusually sad or worried during the postpartum period. This information is not intended to replace advice given to you by your health care provider. Make sure you discuss any questions you have with your health care provider. Document Revised: 12/25/2019 Document Reviewed: 12/25/2019 Elsevier Patient Education  2021 [employer].

## 2020-07-14 NOTE — Progress Notes (Signed)
Pt discharged with infant. Discharge instructions, prescriptions, and follow up appointments given to and reviewed with patient. Pt verbalized understanding. To be escorted out by auxillary.  °

## 2020-07-14 NOTE — Lactation Note (Signed)
This note was copied from a baby's chart. Lactation Consultation Note  Patient Name: Regina Gray WNUUV'O Date: 07/14/2020   Age:32 hours  Attempted follow-up prior to discharge, mom sleeping. LC to check back in.   Danford Bad 07/14/2020, 12:09 PM
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
## 2020-07-19 ENCOUNTER — Encounter: Payer: Commercial Managed Care - PPO | Admitting: Certified Nurse Midwife

## 2020-08-02 ENCOUNTER — Other Ambulatory Visit: Payer: Self-pay

## 2020-08-02 ENCOUNTER — Encounter: Payer: Self-pay | Admitting: Certified Nurse Midwife

## 2020-08-02 ENCOUNTER — Ambulatory Visit (INDEPENDENT_AMBULATORY_CARE_PROVIDER_SITE_OTHER): Payer: Commercial Managed Care - PPO | Admitting: Certified Nurse Midwife

## 2020-08-02 VITALS — BP 108/73 | HR 71 | Resp 16 | Ht 69.0 in | Wt 180.0 lb

## 2020-08-02 DIAGNOSIS — N898 Other specified noninflammatory disorders of vagina: Secondary | ICD-10-CM

## 2020-08-02 DIAGNOSIS — F419 Anxiety disorder, unspecified: Secondary | ICD-10-CM

## 2020-08-02 DIAGNOSIS — F32A Depression, unspecified: Secondary | ICD-10-CM

## 2020-08-02 NOTE — Progress Notes (Signed)
Subjective:    Regina Gray is a 30 y.o. G64P2012 Caucasian female who presents for a postpartum mood check and evaluation of vaginal hemotoma. She is 3 weeks postpartum following a spontaneous vaginal delivery at [redacted]w[redacted]d gestation. Anesthesia: none. I have fully reviewed the prenatal and intrapartum course.   Vaginal hematoma x three (3) weeks, not painful and has been decreasing in size.   Patient and husband moved into new house, and continuing joint therapy sessions.  Baby was ruled out for thrush, but does have a diaper rash. Baby is feeding by breast.   Bleeding thin lochia and brown.   Bowel function is normal. Bladder function is normal.   Patient is not sexually active.  Contraception method is abstinence.   Postpartum depression screening: positive. Score 11/11. Patient continues to take her Zoloft daily, no longer using Ativan PRN.   Last pap 05/13/19 and was Neg/Neg.  Denies difficulty breathing, respiratory distress, chest pain, nipple soreness, and leg pain or swelling.  The following portions of the patient's history were reviewed and updated as appropriate: allergies, current medications, past medical history, past surgical history and problem list.  Review of Systems  Pertinent items are noted in HPI.   Objective:   BP 108/73   Pulse 71   Resp 16   Ht 5\' 9"  (1.753 m)   Wt 81.6 kg   BMI 26.58 kg/m   General:  alert, cooperative and no distress   Breasts:  deferred, no complaints  Lungs: clear to auscultation bilaterally  Heart:  regular rate and rhythm  Abdomen: soft, nontender   Vulva: Normal (hemotoma is non-palpable), soreness to symphysis pubis  Vagina: deferred  Cervix:  Deferred  Corpus: deferred  Adnexa:  deferred      GAD 7 : Generalized Anxiety Score 08/02/2020 07/05/2020 06/21/2020 06/03/2020  Nervous, Anxious, on Edge 2 3 3 3   Control/stop worrying 2 3 3 3   Worry too much - different things 2 3 3 3   Trouble relaxing 1 3 3 3   Restless  1 1 1 1   Easily annoyed or irritable 1 2 2 3   Afraid - awful might happen 2 3 3 3   Total GAD 7 Score 11 18 18 19   Anxiety Difficulty Somewhat difficult Somewhat difficult Very difficult Very difficult    Depression screen [facility] 2/9 08/02/2020 07/05/2020 06/21/2020 06/03/2020 05/24/2020  Decreased Interest 1 2 3 3 3   Down, Depressed, Hopeless 1 3 3 3 3   PHQ - 2 Score 2 5 6 6 6   Altered sleeping 1 3 3 3 3   Tired, decreased energy 2 3 3 3 3   Change in appetite 0 3 3 3 3   Feeling bad or failure about yourself  1 2 2 3 3   Trouble concentrating 3 0 3 3 3   Moving slowly or fidgety/restless 2 0 1 3 3   Suicidal thoughts 0 0 1 1 2   PHQ-9 Score 11 16 22 25 26   Difficult doing work/chores Somewhat difficult Somewhat difficult Very difficult Very difficult Very difficult  Some recent data might be hidden    Assessment:   Postpartum exam  3 weeks wks s/p vaginal delivery  Breastfeeding  Depression screening  Contraception counseling   Plan:   Contraception: abstinence   Encouraged patient to continue taking Zoloft and attending joint therapy with spouse.   Reviewed red flag symptoms and when to call.  RTC x 4 weeks for POSTPARTUM EXAM with JML or sooner if needed.   , [employer] 08/02/20 3:13 PM

## 2020-08-02 NOTE — Patient Instructions (Signed)
Postpartum Care After Vaginal Delivery The following information offers guidance about how to care for yourself from the time you deliver your baby to 6-12 weeks after delivery (postpartum period). If you have problems or questions, contact your health care provider for more specific instructions. Follow these instructions at home: Vaginal bleeding  It is normal to have vaginal bleeding (lochia) after delivery. Wear a sanitary pad for bleeding and discharge. ? During the first week after delivery, the amount and appearance of lochia is often similar to a menstrual period. ? Over the next few weeks, it will gradually decrease to a dry, yellow-brown discharge. ? For most women, lochia stops completely by 4-6 weeks after delivery, but can vary.  Change your sanitary pads frequently. Watch for any changes in your flow, such as: ? A sudden increase in volume. ? A change in color. ? Large blood clots.  If you pass a blood clot from your vagina, save it and call your health care provider. Do not flush blood clots down the toilet before talking with your health care provider.  Do not use tampons or douches until your health care provider approves.  If you are not breastfeeding, your period should return 6-8 weeks after delivery. If you are feeding your baby breast milk only, your period may not return until you stop breastfeeding. Perineal care  Keep the area between the vagina and the anus (perineum) clean and dry. Use medicated pads and pain-relieving sprays and creams as directed.  If you had a surgical cut in the perineum (episiotomy) or a tear, check the area for signs of infection until you are healed. Check for: ? More redness, swelling, or pain. ? Fluid or blood coming from the cut or tear. ? Warmth. ? Pus or a bad smell.  You may be given a squirt bottle to use instead of wiping to clean the perineum area after you use the bathroom. Pat the area gently to dry it.  To relieve pain  caused by an episiotomy, a tear, or swollen veins in the anus (hemorrhoids), take a warm sitz bath 2-3 times a day. In a sitz bath, the warm water should only come up to your hips and cover your buttocks.   Breast care  In the first few days after delivery, your breasts may feel heavy, full, and uncomfortable (breast engorgement). Milk may also leak from your breasts. Ask your health care provider about ways to help relieve the discomfort.  If you are breastfeeding: ? Wear a bra that supports your breasts and fits well. Use breast pads to absorb milk that leaks. ? Keep your nipples clean and dry. Apply creams and ointments as told. ? You may have uterine contractions every time you breastfeed for up to several weeks after delivery. This helps your uterus return to its normal size. ? If you have any problems with breastfeeding, notify your health care provider or lactation consultant.  If you are not breastfeeding: ? Avoid touching your breasts. Do not squeeze out (express) milk. Doing this can make your breasts produce more milk. ? Wear a good-fitting bra and use cold packs to help with swelling. Intimacy and sexuality  Ask your health care provider when you can engage in sexual activity. This may depend upon: ? Your risk of infection. ? How fast you are healing. ? Your comfort and desire to engage in sexual activity.  You are able to get pregnant after delivery, even if you have not had your period. Talk with   your health care provider about methods of birth control (contraception) or family planning if you desire future pregnancies. Medicines  Take over-the-counter and prescription medicines only as told by your health care provider.  Take an over-the-counter stool softener to help ease bowel movements as told by your health care provider.  If you were prescribed an antibiotic medicine, take it as told by your health care provider. Do not stop taking the antibiotic even if you start to  feel better.  Review all previous and current prescriptions to check for possible transfer into breast milk. Activity  Gradually return to your normal activities as told by your health care provider.  Rest as much as possible. Nap while your baby is sleeping. Eating and drinking  Drink enough fluid to keep your urine pale yellow.  To help prevent or relieve constipation, eat high-fiber foods every day.  Choose healthy eating to support breastfeeding or weight loss goals.  Take your prenatal vitamins until your health care provider tells you to stop.   General tips/recommendations  Do not use any products that contain nicotine or tobacco. These products include cigarettes, chewing tobacco, and vaping devices, such as e-cigarettes. If you need help quitting, ask your health care provider.  Do not drink alcohol, especially if you are breastfeeding.  Do not take medications or drugs that are not prescribed to you, especially if you are breastfeeding.  Visit your health care provider for a postpartum checkup within the first 3-6 weeks after delivery.  Complete a comprehensive postpartum visit no later than 12 weeks after delivery.  Keep all follow-up visits for you and your baby. Contact a health care provider if:  You feel unusually sad or worried.  Your breasts become red, painful, or hard.  You have a fever or other signs of an infection.  You have bleeding that is soaking through one pad an hour or you have blood clots.  You have a severe headache that doesn't go away or you have vision changes.  You have nausea and vomiting and are unable to eat or drink anything for 24 hours. Get help right away if:  You have chest pain or difficulty breathing.  You have sudden, severe leg pain.  You faint or have a seizure.  You have thoughts about hurting yourself or your baby. If you ever feel like you may hurt yourself or others, or have thoughts about taking your own life,  get help right away. Go to your nearest emergency department or:  Call your local emergency services (911 in the [place]  The National Suicide Prevention Lifeline at 1-800-273-8255. This suicide crisis helpline is open 24 hours a day.  Text the Crisis Text Line at 741741 (in the [place] Summary  The period of time after you deliver your newborn up to 6-12 weeks after delivery is called the postpartum period.  Keep all follow-up visits for you and your baby.  Review all previous and current prescriptions to check for possible transfer into breast milk.  Contact a health care provider if you feel unusually sad or worried during the postpartum period. This information is not intended to replace advice given to you by your health care provider. Make sure you discuss any questions you have with your health care provider. Document Revised: 12/25/2019 Document Reviewed: 12/25/2019 Elsevier Patient Education  2021 Elsevier Inc.   Postpartum Baby Blues The postpartum period begins right after the birth of a baby. During this time, there is often joy and excitement. It is   also a time of many changes in the life of the parents. A mother may feel happy one minute and sad or stressed the next. These feelings of sadness, called the baby blues, usually happen in the period right after the baby is born and go away within a week or two. What are the causes? The exact cause of this condition is not known. Changes in hormone levels after childbirth are believed to trigger some of the symptoms. Other factors that can play a role in these mood changes include:  Lack of sleep.  Stressful life events, such as financial problems, caring for a loved one, or death of a loved one.  Genetics. What are the signs or symptoms? Symptoms of this condition include:  Changes in mood, such as going from extreme happiness to sadness.  A decrease in concentration.  Difficulty sleeping.  Crying spells and  tearfulness.  Loss of appetite.  Irritability.  Anxiety. If these symptoms last for more than 2 weeks or become more severe, you may have postpartum depression. How is this diagnosed? This condition is diagnosed based on an evaluation of your symptoms. Your health care provider may use a screening tool that includes a list of questions to help identify a person with the baby blues or postpartum depression. How is this treated? The baby blues usually go away on their own in 1-2 weeks. Social support is often what is needed. You will be encouraged to get adequate sleep and rest. Follow these instructions at home: Lifestyle  Get as much rest as you can. Take a nap when the baby sleeps.  Exercise regularly as told by your health care provider. Some women find yoga and walking to be helpful.  Eat a balanced and nourishing diet. This includes plenty of fruits and vegetables, whole grains, and lean proteins.  Do little things that you enjoy. Take a bubble bath, read your favorite magazine, or listen to your favorite music.  Avoid alcohol.  Ask for help with household chores, cooking, grocery shopping, or running errands. Do not try to do everything yourself. Consider hiring a postpartum doula to help. This is a professional who specializes in providing support to new mothers.  Try not to make any major life changes during pregnancy or right after giving birth. This can add stress.      General instructions  Talk to people close to you about how you are feeling. Get support from your partner, family members, friends, or other new moms. You may want to join a support group.  Find ways to manage stress. This may include: ? Writing your thoughts and feelings in a journal. ? Spending time outside. ? Spending time with people who make you laugh.  Try to stay positive in how you think. Think about the things you are grateful for.  Take over-the-counter and prescription medicines only as  told by your health care provider.  Let your health care provider know if you have any concerns.  Keep all postpartum visits. This is important. Contact a health care provider if:  Your baby blues do not go away after 2 weeks. Get help right away if:  You have thoughts of taking your own life (suicidal thoughts), or of harming your baby or someone else.  You see or hear things that are not there (hallucinations). If you ever feel like you may hurt yourself or others, or have thoughts about taking your own life, get help right away. Go to your nearest emergency department or:    Call your local emergency services (911 in the [place]  Call a suicide crisis helpline, such as the National Suicide Prevention Lifeline, at 1-800-273-8255. This is open 24 hours a day in the [place]  Text the Crisis Text Line at 741741 (in the [place] Summary  After giving birth, you may feel happy one minute and sad or stressed the next. Feelings of sadness that happen right after the baby is born and go away after a week or two are called the baby blues.  You can manage the baby blues by getting enough rest, eating a healthy diet, exercising, spending time with supportive people, and finding ways to manage stress.  If feelings of sadness and stress last longer than 2 weeks or get in the way of caring for your baby, talk with your health care provider. This may mean you have postpartum depression. This information is not intended to replace advice given to you by your health care provider. Make sure you discuss any questions you have with your health care provider. Document Revised: 10/03/2019 Document Reviewed: 10/03/2019 Elsevier Patient Education  2021 Elsevier Inc.     

## 2020-08-02 NOTE — Progress Notes (Signed)
I have seen, interviewed, and examined the patient in conjunction with the [employer] and affirm the diagnosis and management plan.   Gunnar Bulla, CNM [facility], [facility] 08/02/20 3:45 PM

## 2020-08-23 ENCOUNTER — Other Ambulatory Visit: Payer: Self-pay

## 2020-08-23 ENCOUNTER — Ambulatory Visit (INDEPENDENT_AMBULATORY_CARE_PROVIDER_SITE_OTHER): Payer: Commercial Managed Care - PPO | Admitting: Certified Nurse Midwife

## 2020-08-23 ENCOUNTER — Encounter: Payer: Self-pay | Admitting: Certified Nurse Midwife

## 2020-08-23 DIAGNOSIS — F32A Depression, unspecified: Secondary | ICD-10-CM

## 2020-08-23 DIAGNOSIS — Z1331 Encounter for screening for depression: Secondary | ICD-10-CM

## 2020-08-23 DIAGNOSIS — Z3002 Counseling and instruction in natural family planning to avoid pregnancy: Secondary | ICD-10-CM

## 2020-08-23 DIAGNOSIS — F419 Anxiety disorder, unspecified: Secondary | ICD-10-CM

## 2020-08-23 NOTE — Progress Notes (Signed)
Subjective:    Regina Gray is a 30 y.o. G42P2012 Caucasian female who presents for a postpartum visit. She is 6 weeks postpartum following a spontaneous vaginal delivery at [redacted]w[redacted]d gestation weeks. Anesthesia: none. I have fully reviewed the prenatal and intrapartum course.   Postpartum course has been complicated by vaginal hemotoma (resolved) and pubic symphysis discomfort.  Baby's course has been uncomplicated. Baby is feeding by breast.   Bleeding no bleeding. Bowel function is normal. Bladder function is normal.   Patient is not sexually active.  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  Postpartum depression screening: negative. Score PHQ=3; GAD=3. Taking Zoloft daily, speaking with counselor weekly.   Last pap 06/13/2019 and was Neg/Neg.  Denies difficulty breathing or respiratory distress, chest pain, vaginal bleeding, constipation, breast soreness or redden areas, flu-like symptoms, or leg pain or swelling.  The following portions of the patient's history were reviewed and updated as appropriate: allergies, current medications, past medical history, past surgical history and problem list.  Review of Systems  Pertinent items are noted in HPI.   Objective:   BP 115/80   Pulse 69   Resp 16   Ht 5\' 9"  (1.753 m)   Wt 81.3 kg   Breastfeeding Yes   BMI 26.48 kg/m   General:  alert, cooperative and no distress   Breasts:  deferred, no complaints  Lungs: clear to auscultation bilaterally  Heart:  regular rate and rhythm  Abdomen: soft, nontender  Pelvic  declined exam, no complaints        Depression screen [facility] 2/9 08/23/2020 08/02/2020 07/05/2020 06/21/2020 06/03/2020  Decreased Interest 1 1 2 3 3   Down, Depressed, Hopeless 0 1 3 3 3   PHQ - 2 Score 1 2 5 6 6   Altered sleeping 0 1 3 3 3   Tired, decreased energy 1 2 3 3 3   Change in appetite 0 0 3 3 3   Feeling bad or failure about yourself  1 1 2 2 3   Trouble concentrating 0 3 0 3 3  Moving slowly  or fidgety/restless 0 2 0 1 3  Suicidal thoughts 0 0 0 1 1  PHQ-9 Score 3 11 16 22 25   Difficult doing work/chores Not difficult at all Somewhat difficult Somewhat difficult Very difficult Very difficult  Some recent data might be hidden    GAD 7 : Generalized Anxiety Score 08/23/2020 08/02/2020 07/05/2020 06/21/2020  Nervous, Anxious, on Edge 1 2 3 3   Control/stop worrying 1 2 3 3   Worry too much - different things 0 2 3 3   Trouble relaxing 0 1 3 3   Restless 0 1 1 1   Easily annoyed or irritable 0 1 2 2   Afraid - awful might happen 1 2 3 3   Total GAD 7 Score 3 11 18 18   Anxiety Difficulty Not difficult at all Somewhat difficult Somewhat difficult Very difficult     Assessment:   Postpartum exam  6 wks s/p vaginal delivery  Breastfeeding  Depression screening  Contraception counseling   Plan:   Encouraged routine health maintenance techniques.   Referral to Pelvic Floor Physical Therapy, see orders.  Continue medication as prescribed. Rx Zoloft, see orders.   Reviewed red flag symptoms and when to call.  RTC x 6-8 months for ANNUAL EXAM or sooner if needed.   , Student-MidWife [employer] 08/23/20 10:37 AM

## 2020-08-23 NOTE — Patient Instructions (Signed)
Preventive Care 11-32 Years Old, Female Preventive care refers to lifestyle choices and visits with your health care provider that can promote health and wellness. This includes:  A yearly physical exam. This is also called an annual wellness visit.  Regular dental and eye exams.  Immunizations.  Screening for certain conditions.  Healthy lifestyle choices, such as: ? Eating a healthy diet. ? Getting regular exercise. ? Not using drugs or products that contain nicotine and tobacco. ? Limiting alcohol use. What can I expect for my preventive care visit? Physical exam Your health care provider may check your:  Height and weight. These may be used to calculate your BMI (body mass index). BMI is a measurement that tells if you are at a healthy weight.  Heart rate and blood pressure.  Body temperature.  Skin for abnormal spots. Counseling Your health care provider may ask you questions about your:  Past medical problems.  Family's medical history.  Alcohol, tobacco, and drug use.  Emotional well-being.  Home life and relationship well-being.  Sexual activity.  Diet, exercise, and sleep habits.  Work and work Statistician.  Access to firearms.  Method of birth control.  Menstrual cycle.  Pregnancy history. What immunizations do I need? Vaccines are usually given at various ages, according to a schedule. Your health care provider will recommend vaccines for you based on your age, medical history, and lifestyle or other factors, such as travel or where you work.   What tests do I need? Blood tests  Lipid and cholesterol levels. These may be checked every 5 years starting at age 64.  Hepatitis C test.  Hepatitis B test. Screening  Diabetes screening. This is done by checking your blood sugar (glucose) after you have not eaten for a while (fasting).  STD (sexually transmitted disease) testing, if you are at risk.  BRCA-related cancer screening. This may  be done if you have a family history of breast, ovarian, tubal, or peritoneal cancers.  Pelvic exam and Pap test. This may be done every 3 years starting at age 61. Starting at age 82, this may be done every 5 years if you have a Pap test in combination with an HPV test. Talk with your health care provider about your test results, treatment options, and if necessary, the need for more tests.   Follow these instructions at home: Eating and drinking  Eat a healthy diet that includes fresh fruits and vegetables, whole grains, lean protein, and low-fat dairy products.  Take vitamin and mineral supplements as recommended by your health care provider.  Do not drink alcohol if: ? Your health care provider tells you not to drink. ? You are pregnant, may be pregnant, or are planning to become pregnant.  If you drink alcohol: ? Limit how much you have to 0-1 drink a day. ? Be aware of how much alcohol is in your drink. In the [place] one drink equals one 12 oz bottle of beer (355 mL), one 5 oz glass of wine (148 mL), or one 1 oz glass of hard liquor (44 mL).   Lifestyle  Take daily care of your teeth and gums. Brush your teeth every morning and night with fluoride toothpaste. Floss one time each day.  Stay active. Exercise for at least 30 minutes 5 or more days each week.  Do not use any products that contain nicotine or tobacco, such as cigarettes, e-cigarettes, and chewing tobacco. If you need help quitting, ask your health care provider.  Do  not use drugs.  If you are sexually active, practice safe sex. Use a condom or other form of protection to prevent STIs (sexually transmitted infections).  If you do not wish to become pregnant, use a form of birth control. If you plan to become pregnant, see your health care provider for a prepregnancy visit.  Find healthy ways to cope with stress, such as: ? Meditation, yoga, or listening to music. ? Journaling. ? Talking to a trusted  person. ? Spending time with friends and family. Safety  Always wear your seat belt while driving or riding in a vehicle.  Do not drive: ? If you have been drinking alcohol. Do not ride with someone who has been drinking. ? When you are tired or distracted. ? While texting.  Wear a helmet and other protective equipment during sports activities.  If you have firearms in your house, make sure you follow all gun safety procedures.  Seek help if you have been physically or sexually abused. What's next?  Go to your health care provider once a year for an annual wellness visit.  Ask your health care provider how often you should have your eyes and teeth checked.  Stay up to date on all vaccines. This information is not intended to replace advice given to you by your health care provider. Make sure you discuss any questions you have with your health care provider. Document Revised: 12/07/2019 Document Reviewed: 12/20/2017 Elsevier Patient Education  2021 [employer].

## 2020-08-23 NOTE — Progress Notes (Signed)
I have seen, interviewed, and examined the patient in conjunction with the [employer] and affirm the diagnosis and management plan.   Gunnar Bulla, CNM [facility], [facility] 08/23/20 11:47 AM

## 2020-09-07 ENCOUNTER — Other Ambulatory Visit: Payer: Self-pay | Admitting: Certified Nurse Midwife

## 2020-11-01 ENCOUNTER — Other Ambulatory Visit: Payer: Self-pay | Admitting: Certified Nurse Midwife

## 2020-11-01 DIAGNOSIS — M25551 Pain in right hip: Secondary | ICD-10-CM

## 2020-11-01 NOTE — Progress Notes (Signed)
Referral to Antoine Primas, see chart.    Serafina Royals, CNM [facility], [facility] 11/01/20 10:10 AM

## 2020-11-08 ENCOUNTER — Ambulatory Visit: Payer: Commercial Managed Care - PPO

## 2020-11-24 ENCOUNTER — Ambulatory Visit
Payer: Commercial Managed Care - PPO | Attending: Rehabilitative and Restorative Service Providers" | Admitting: Physical Therapy

## 2020-11-24 DIAGNOSIS — M542 Cervicalgia: Secondary | ICD-10-CM | POA: Insufficient documentation

## 2020-11-24 DIAGNOSIS — G8929 Other chronic pain: Secondary | ICD-10-CM | POA: Insufficient documentation

## 2020-11-24 DIAGNOSIS — M25512 Pain in left shoulder: Secondary | ICD-10-CM | POA: Insufficient documentation

## 2020-11-24 DIAGNOSIS — M25551 Pain in right hip: Secondary | ICD-10-CM | POA: Insufficient documentation

## 2020-11-24 DIAGNOSIS — M62830 Muscle spasm of back: Secondary | ICD-10-CM | POA: Insufficient documentation

## 2020-11-24 DIAGNOSIS — M533 Sacrococcygeal disorders, not elsewhere classified: Secondary | ICD-10-CM | POA: Insufficient documentation

## 2020-12-02 ENCOUNTER — Encounter: Payer: Self-pay | Admitting: Physical Therapy

## 2020-12-02 ENCOUNTER — Ambulatory Visit: Payer: Commercial Managed Care - PPO | Admitting: Physical Therapy

## 2020-12-02 ENCOUNTER — Other Ambulatory Visit: Payer: Self-pay

## 2020-12-02 DIAGNOSIS — M25551 Pain in right hip: Secondary | ICD-10-CM | POA: Diagnosis present

## 2020-12-02 DIAGNOSIS — M25512 Pain in left shoulder: Secondary | ICD-10-CM | POA: Diagnosis present

## 2020-12-02 DIAGNOSIS — M533 Sacrococcygeal disorders, not elsewhere classified: Secondary | ICD-10-CM | POA: Diagnosis not present

## 2020-12-02 DIAGNOSIS — M542 Cervicalgia: Secondary | ICD-10-CM | POA: Diagnosis present

## 2020-12-02 DIAGNOSIS — G8929 Other chronic pain: Secondary | ICD-10-CM | POA: Diagnosis present

## 2020-12-02 DIAGNOSIS — M62830 Muscle spasm of back: Secondary | ICD-10-CM | POA: Diagnosis present

## 2020-12-02 NOTE — Patient Instructions (Signed)
  Lengthen Back rib by R  shoulder    Lie on L  side , pillow between knees and under head  Pull  arm overhead over mattress, grab the edge of mattress,pull it upward, drawing elbow away from ears  Breathing 10 reps  Open book (handout)  Lying on  _ side , rotating  __ only this week  Rotating onto pillow /yoga block  Pillow/ Block between knees  10 reps  __  6 directions of the neck not neck rolls
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
## 2020-12-08 NOTE — Progress Notes (Signed)
[name] [address] [place] [address] Phone: [phone] Subjective:   Bruce Donath, am serving as a scribe for Dr. Antoine Primas. This visit occurred during the SARS-CoV-2 public health emergency.  Safety protocols were in place, including screening questions prior to the visit, additional usage of staff PPE, and extensive cleaning of exam room while observing appropriate contact time as indicated for disinfecting solutions.   I'm seeing this patient by the request  of:  Bacigalupo, Marzella Schlein, MD  CC: Low back pain  DCV:[id number]  Regina Gray is a 30 y.o. female coming in with complaint of back pain. Seen last in January 2021. Patient states that she has been having R hip pain and tightness. Pain in glute, adductor, and hip flexor. Is going to PT and states that therapist feels that back is tight causing her issues. Pain with ER of hip. Also notes clicking and popping of her hip. Patient stretches daily but wakes up with tightness.   MRI Lumbar 2021 IMPRESSION: L4-5: Mild disc degeneration with annular bulging and annular fissures. No apparent neural compression. The finding could be associated with back pain or nerve irritation.   L5-S1: Disc degeneration with a shallow disc herniation with slight caudal down turning behind the superior endplate of S1. This is adjacent to the S1 nerves but does not cause nerve compression or displacement. This could be associated with nerve irritation.  Past Medical History:  Diagnosis Date   Complication of anesthesia    woke up during surgery   Past Surgical History:  Procedure Laterality Date   ANTERIOR CRUCIATE LIGAMENT REPAIR     APPENDECTOMY     WISDOM TOOTH EXTRACTION     Social History   Socioeconomic History   Marital status: Married    Spouse name: Not on file   Number of children: Not on file   Years of education: Not on file   Highest education level: Not on file   Occupational History   Not on file  Tobacco Use   Smoking status: Never   Smokeless tobacco: Never  Vaping Use   Vaping Use: Never used  Substance and Sexual Activity   Alcohol use: Not Currently   Drug use: No   Sexual activity: Not Currently    Birth control/protection: None  Other Topics Concern   Not on file  Social History Narrative   Not on file   Social Determinants of Health   Financial Resource Strain: Not on file  Food Insecurity: Not on file  Transportation Needs: Not on file  Physical Activity: Not on file  Stress: Not on file  Social Connections: Not on file   Allergies  Allergen Reactions   Codeine    Hydromorphone     Other reaction(s): Vomiting   Oxycodone     Other reaction(s): Vomiting   Sulfa Antibiotics    Family History  Problem Relation Age of Onset   Heart attack Maternal Grandfather    Healthy Mother    Healthy Father    Breast cancer Neg Hx    Ovarian cancer Neg Hx    Colon cancer Neg Hx    Diabetes Neg Hx        Current Outpatient Medications (Analgesics):    acetaminophen (TYLENOL) 500 MG tablet, Take 2 tablets (1,000 mg total) by mouth every 6 (six) hours as needed for fever or headache.   Current Outpatient Medications (Other):    famotidine (PEPCID) 20 MG tablet, Take  1 tablet (20 mg total) by mouth daily.   LORazepam (ATIVAN) 1 MG tablet, Take 1 tablet (1 mg total) by mouth at bedtime as needed for anxiety.   Magnesium 500 MG CAPS, Take by mouth.   ondansetron (ZOFRAN ODT) 8 MG disintegrating tablet, Take 1 tablet (8 mg total) by mouth every 8 (eight) hours as needed for nausea or vomiting.   sertraline (ZOLOFT) 100 MG tablet, TAKE 2 TABLETS(200 MG) BY MOUTH DAILY   Reviewed prior external information including notes and imaging from  primary care provider As well as notes that were available from care everywhere and other healthcare systems.  Past medical history, social, surgical and family history all reviewed in  electronic medical record.  No pertanent information unless stated regarding to the chief complaint.   Review of Systems:  No headache, visual changes, nausea, vomiting, diarrhea, constipation, dizziness, abdominal pain, skin rash, fevers, chills, night sweats, weight loss, swollen lymph nodes, body aches, joint swelling, chest pain, shortness of breath, mood changes. POSITIVE muscle aches  Objective  Blood pressure 110/80, pulse 84, height 5\' 9"  (1.753 m), weight 190 lb (86.2 kg), SpO2 98 %, currently breastfeeding.   General: No apparent distress alert and oriented x3 mood and affect normal, dressed appropriately.  HEENT: Pupils equal, extraocular movements intact  Respiratory: Patient's speak in full sentences and does not appear short of breath  Cardiovascular: No lower extremity edema, non tender, no erythema  Gait normal with good balance and coordination.  MSK: Low back exam does have some mild loss of lordosis.  Severe tenderness over the right sacroiliac joint and minorly over the right paraspinal musculature of the lumbar spine mostly around the L3-L4 and L4-L5 areas.  Tightness noted minorly with straight leg on the right.  5 out of 5 strength over the lower extremities. Mild tightness noted in the right parascapular region.  Osteopathic findings  C7 flexed rotated and side bent right T5 extended rotated and side bent right inhaled rib L2 flexed rotated and side bent right Sacrum right on right  [id number]; 15 additional minutes spent for Therapeutic exercises as stated in above notes.  This included exercises focusing on stretching, strengthening, with significant focus on eccentric aspects.   Long term goals include an improvement in range of motion, strength, endurance as well as avoiding reinjury. Patient's frequency would include in 1-2 times a day, 3-5 times a week for a duration of 6-12 weeks. Hip strengthening exercises which included:  Pelvic tilt/bracing to help with proper  recruitment of the lower abs and pelvic floor muscles  Glute strengthening to properly contract glutes without over-engaging low back and hamstrings - prone hip extension and glute bridge exercises Proper stretching techniques to increase effectiveness for the hip flexors, groin, quads, piriformic and low back when appropriate   Proper technique shown and discussed handout in great detail with ATC.  All questions were discussed and answered.      Impression and Recommendations:     The above documentation has been reviewed and is accurate and complete , DO

## 2020-12-09 ENCOUNTER — Ambulatory Visit: Payer: Commercial Managed Care - PPO | Admitting: Physical Therapy

## 2020-12-09 ENCOUNTER — Ambulatory Visit (INDEPENDENT_AMBULATORY_CARE_PROVIDER_SITE_OTHER): Payer: Commercial Managed Care - PPO

## 2020-12-09 ENCOUNTER — Other Ambulatory Visit: Payer: Self-pay

## 2020-12-09 ENCOUNTER — Ambulatory Visit (INDEPENDENT_AMBULATORY_CARE_PROVIDER_SITE_OTHER): Payer: Commercial Managed Care - PPO | Admitting: Family Medicine

## 2020-12-09 VITALS — BP 110/80 | HR 84 | Ht 69.0 in | Wt 190.0 lb

## 2020-12-09 DIAGNOSIS — M9903 Segmental and somatic dysfunction of lumbar region: Secondary | ICD-10-CM | POA: Diagnosis not present

## 2020-12-09 DIAGNOSIS — M9908 Segmental and somatic dysfunction of rib cage: Secondary | ICD-10-CM

## 2020-12-09 DIAGNOSIS — M9902 Segmental and somatic dysfunction of thoracic region: Secondary | ICD-10-CM

## 2020-12-09 DIAGNOSIS — M533 Sacrococcygeal disorders, not elsewhere classified: Secondary | ICD-10-CM

## 2020-12-09 DIAGNOSIS — M9904 Segmental and somatic dysfunction of sacral region: Secondary | ICD-10-CM

## 2020-12-09 DIAGNOSIS — M25512 Pain in left shoulder: Secondary | ICD-10-CM

## 2020-12-09 DIAGNOSIS — M24551 Contracture, right hip: Secondary | ICD-10-CM | POA: Insufficient documentation

## 2020-12-09 DIAGNOSIS — M25551 Pain in right hip: Secondary | ICD-10-CM

## 2020-12-09 DIAGNOSIS — M542 Cervicalgia: Secondary | ICD-10-CM

## 2020-12-09 DIAGNOSIS — M62830 Muscle spasm of back: Secondary | ICD-10-CM

## 2020-12-09 DIAGNOSIS — G8929 Other chronic pain: Secondary | ICD-10-CM

## 2020-12-09 DIAGNOSIS — M9901 Segmental and somatic dysfunction of cervical region: Secondary | ICD-10-CM | POA: Diagnosis not present

## 2020-12-09 IMAGING — DX DG HIP (WITH OR WITHOUT PELVIS) 2-3V*R*
3 series · 3 of 3 positions shown · non-contrast
Comparison: None.

CLINICAL DATA: Chronic right hip pain

EXAM:
DG HIP (WITH OR WITHOUT PELVIS) 2-3V RIGHT

[pelvis ap]
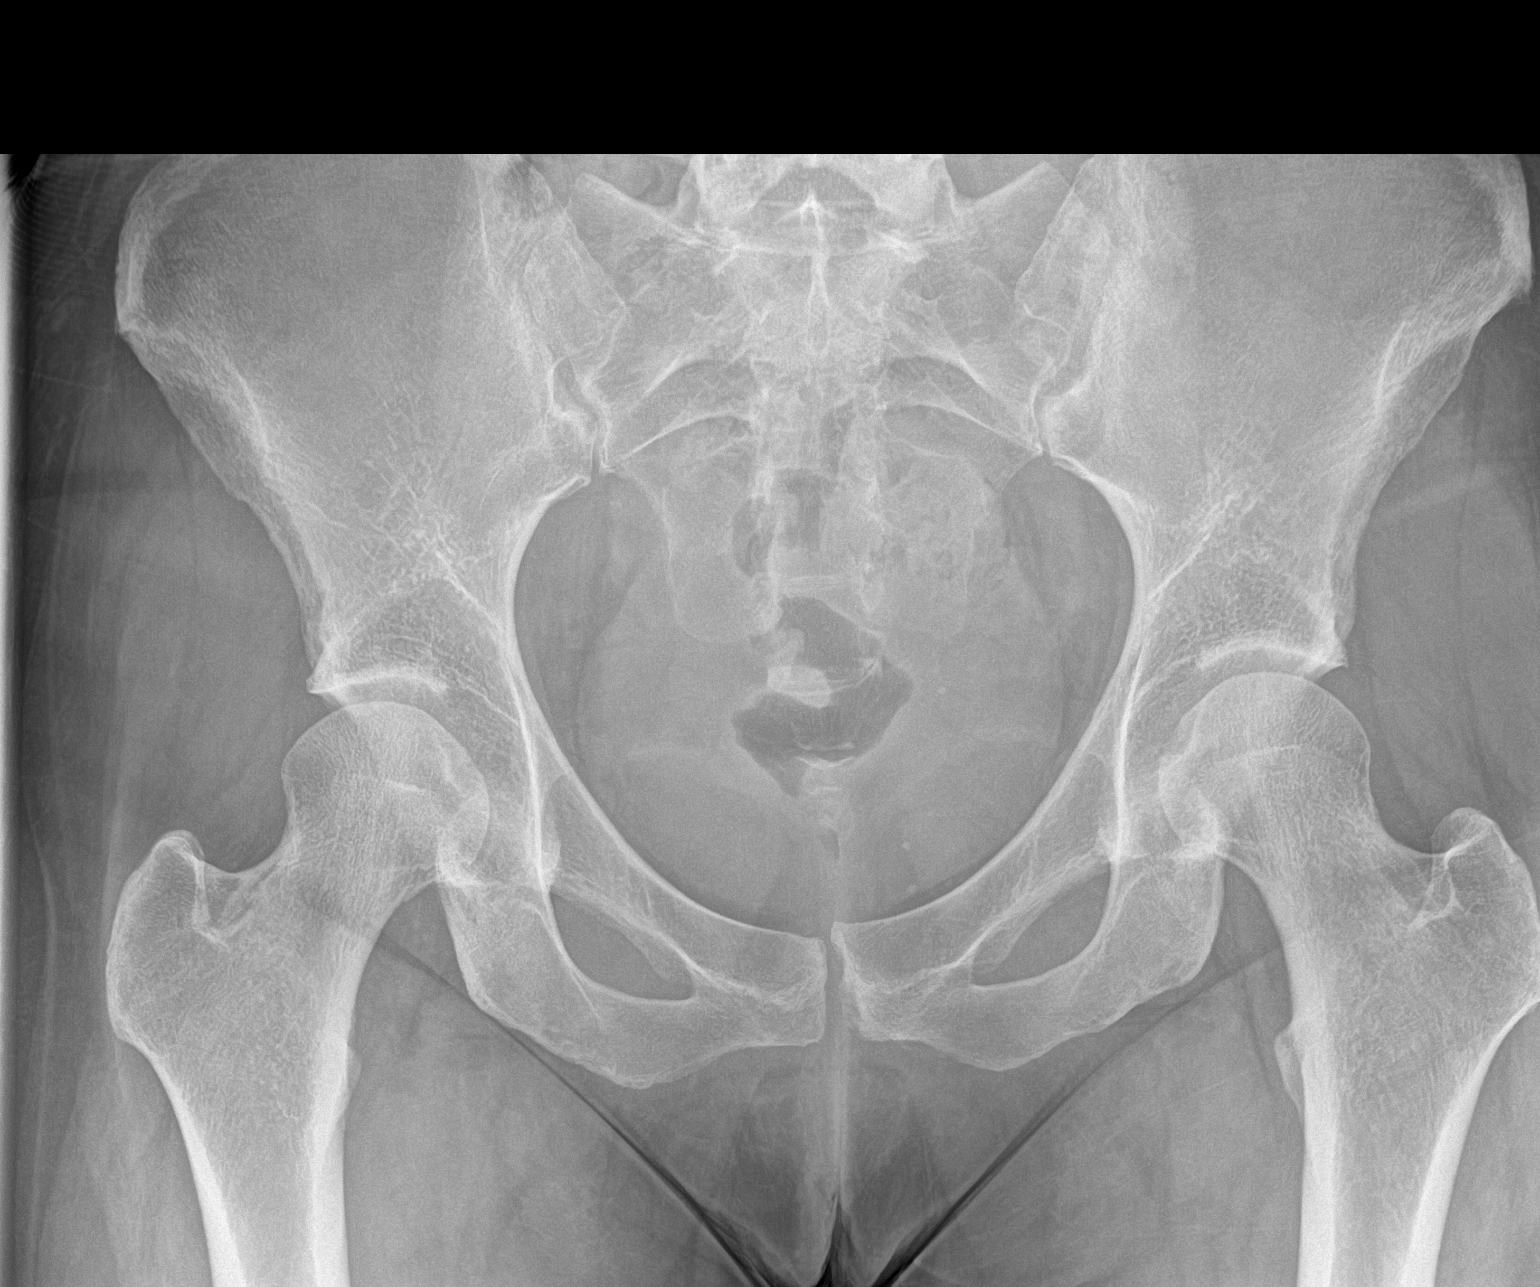

[hip ap]
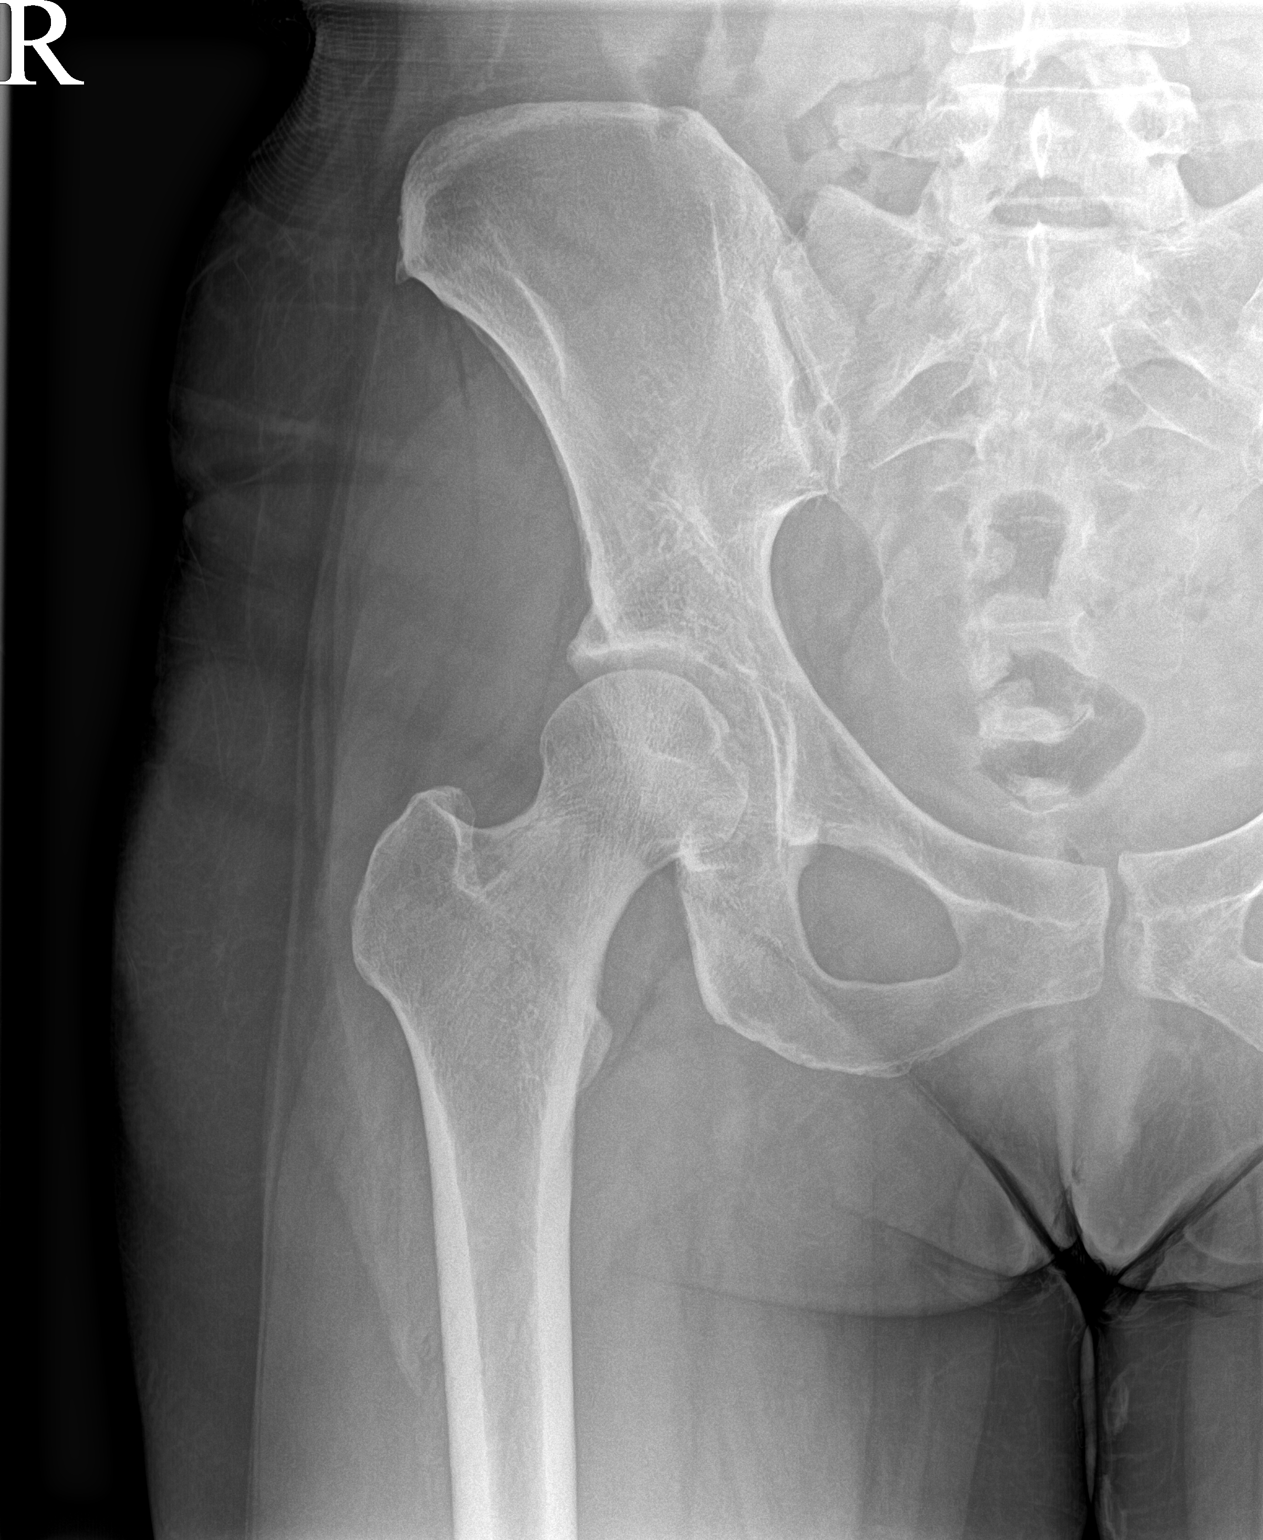

[hip frog leg]
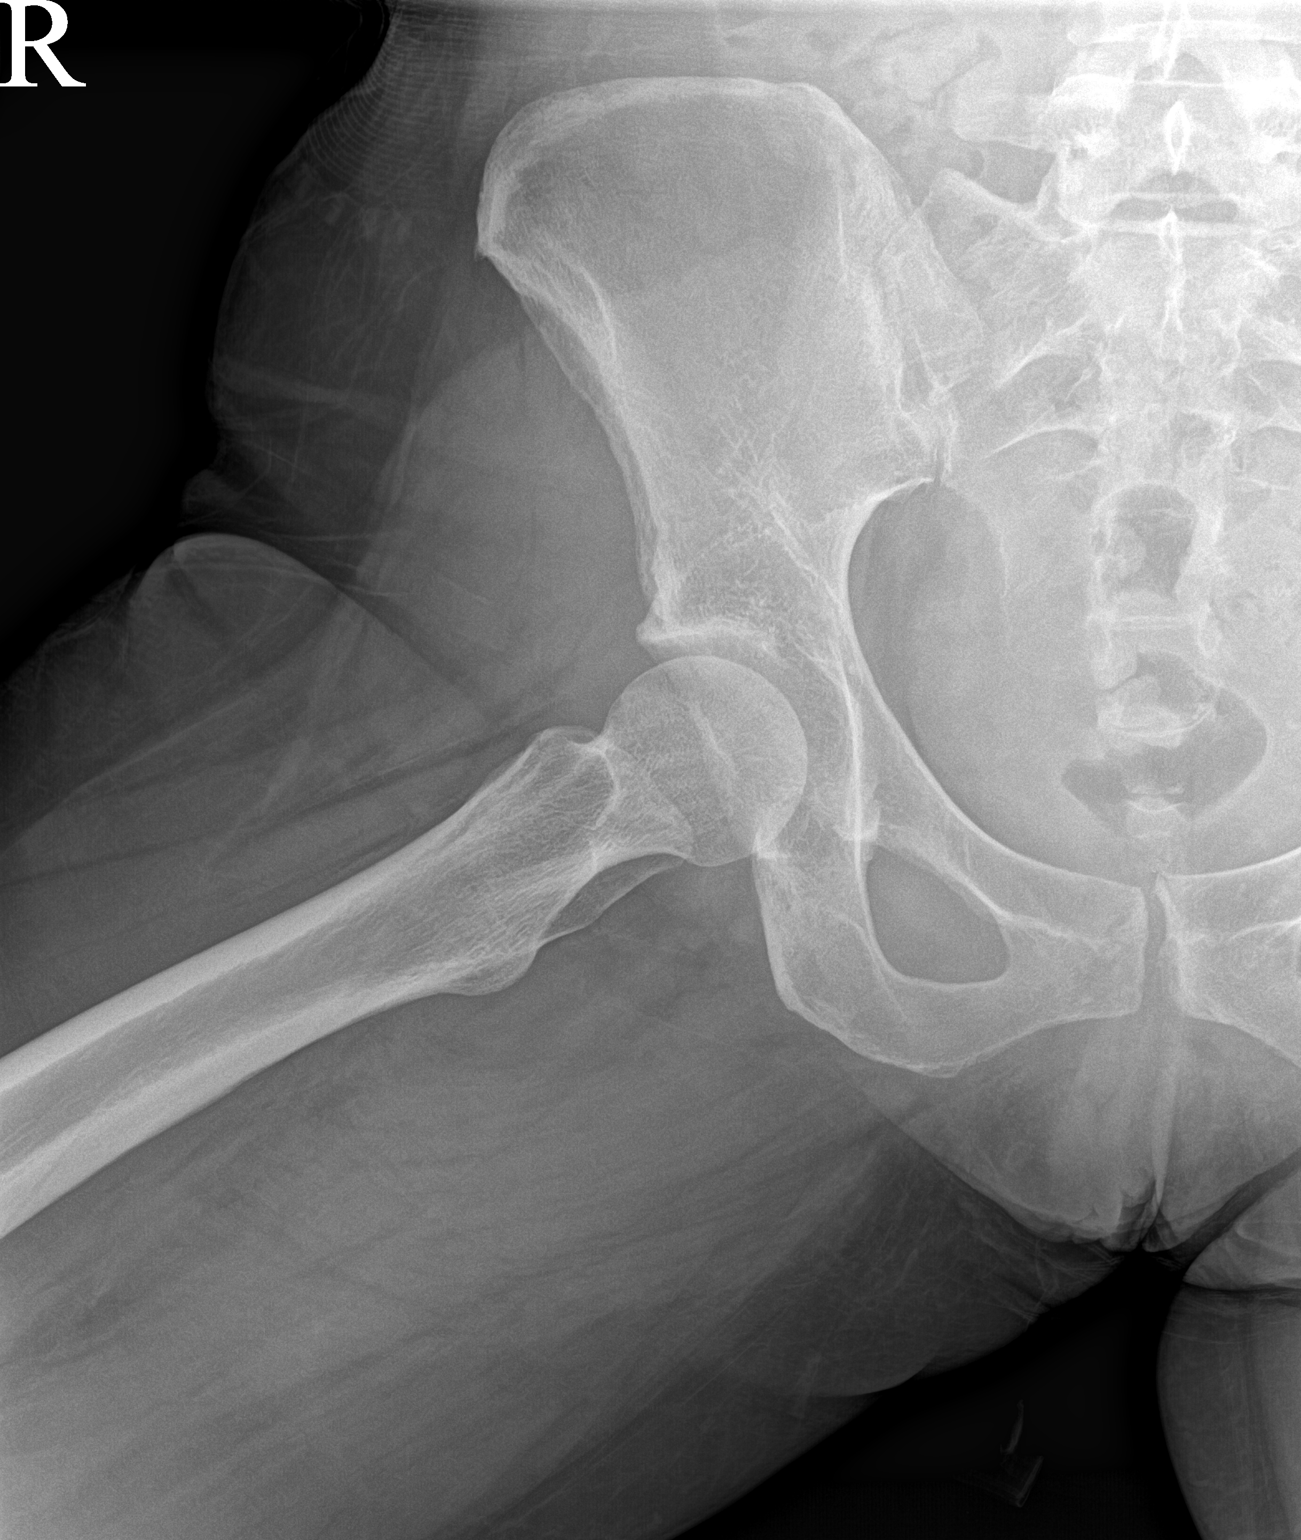

[3 of 3 positions shown; findings below may reference images not displayed]

FINDINGS: There is no evidence of hip fracture or dislocation. There is no
evidence of arthropathy or other focal bone abnormality.
IMPRESSION: Negative.

## 2020-12-09 NOTE — Patient Instructions (Signed)
Lying on back, knees bent    band under ballmounds  while laying on back w/ knees bent  "W" exercise  20 reps x 2 sets   Band is placed under feet, knees bent, feet are hip width apart Hold band with thumbs point out, keep upper arm and elbow touching the bed the whole time  - inhale and then exhale pull bands by bending elbows hands move in a "w"  (feel shoulder blades squeezing)   __   Avoid straining pelvic floor, abdominal muscles , spine  Use log rolling technique instead of getting out of bed with your neck or the sit-up     Log rolling into and out of bed   Log rolling into and out of bed If getting out of bed on R side, Bent knees, scoot hips/ shoulder to L  Raise R arm completely overhead, rolling onto armpit  Then lower bent knees to bed to get into complete side lying position  Then drop legs off bed, and push up onto R elbow/forearm, and use L hand to push onto the bed

## 2020-12-09 NOTE — Patient Instructions (Addendum)
Good to see you  Okay to work with physical therapist but okay to do on your own  Exercises given hip abductor  Continue vit D X ray on way out See me again in 4-6 weeks

## 2020-12-09 NOTE — Assessment & Plan Note (Signed)

## 2020-12-09 NOTE — Assessment & Plan Note (Signed)
Significant tightness of the hip flexor noted on the right side.  We discussed icing regimen and home exercises, discussed avoiding certain activities.  Patient is breast-feeding at the moment and will avoid prescription medications discussed proper holding of the child.  Follow-up with me again 6 to 8 weeks.  Responded fairly well to manipulation.
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
## 2020-12-16 ENCOUNTER — Ambulatory Visit: Payer: Commercial Managed Care - PPO | Admitting: Physical Therapy

## 2020-12-16 ENCOUNTER — Other Ambulatory Visit: Payer: Self-pay

## 2020-12-16 DIAGNOSIS — M62830 Muscle spasm of back: Secondary | ICD-10-CM

## 2020-12-16 DIAGNOSIS — M533 Sacrococcygeal disorders, not elsewhere classified: Secondary | ICD-10-CM | POA: Diagnosis not present

## 2020-12-16 DIAGNOSIS — M25551 Pain in right hip: Secondary | ICD-10-CM

## 2020-12-16 DIAGNOSIS — M542 Cervicalgia: Secondary | ICD-10-CM

## 2020-12-16 DIAGNOSIS — M25512 Pain in left shoulder: Secondary | ICD-10-CM

## 2020-12-16 NOTE — Patient Instructions (Addendum)
Multifidis twist  Band is on doorknob: Sit in chair but halfway from back of chair   facing perpendicular) to door   Twisting trunk without moving the hips and knees Hold band at the level of ribcage, elbows bent,shoulder blades roll back and down like squeezing a pencil under armpit    Exhale twist,.10-15 deg away from door without moving your hips/ knees. Continue to maintain equal weight through legs. Keep knee unlocked.  10 x 2    __  Remember to not let upper ab be involved on exhale  (Low hand sinks first)   Deep core level 1 -2 (handout)  Handout)  +++++++++++++++++++++++++++  Stretch:   Side of hip stretch:  Reclined twist for hips and side of the hips/ legs  Lay on your back, knees bend Scoot hips to the R , leave shoulders in place Drop knees to the L side resting onto pillows to keep leg at the same width of hips Pillow under L thigh to minimize too much strain
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
## 2020-12-23 ENCOUNTER — Ambulatory Visit: Payer: Commercial Managed Care - PPO | Attending: Family Medicine | Admitting: Physical Therapy

## 2020-12-23 ENCOUNTER — Other Ambulatory Visit: Payer: Self-pay

## 2020-12-23 DIAGNOSIS — G8929 Other chronic pain: Secondary | ICD-10-CM | POA: Insufficient documentation

## 2020-12-23 DIAGNOSIS — M62830 Muscle spasm of back: Secondary | ICD-10-CM | POA: Insufficient documentation

## 2020-12-23 DIAGNOSIS — M533 Sacrococcygeal disorders, not elsewhere classified: Secondary | ICD-10-CM | POA: Diagnosis not present

## 2020-12-23 DIAGNOSIS — M25512 Pain in left shoulder: Secondary | ICD-10-CM | POA: Diagnosis present

## 2020-12-23 DIAGNOSIS — M25551 Pain in right hip: Secondary | ICD-10-CM | POA: Diagnosis present

## 2020-12-23 DIAGNOSIS — M542 Cervicalgia: Secondary | ICD-10-CM | POA: Diagnosis present
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
## 2020-12-23 NOTE — Patient Instructions (Addendum)
     kitchen counter stretches  Hands on kitchen counter,   Palms shoulder width apart  Minisquat postion Trunk is parallel to floor  A) Pull buttocks back to lengthen spine, knees bent  3 breaths   B) Bring R hand to the L, and stretch the R side trunk  3 breaths   Brings hands to center again Do the same to the L side stretch by placing L hand on top of R   D) Modified thread the needle R hand on L thigh, L  thigh pushing out slightly as the R hands pull in,  elbow bent and pulls to theR,  Look under L armpit   Do the same to other side   ___   Thread the needle modified   Table top position, toes tucked under for 6 points of contact, hands shoulder width apart   Wrists under shoulder Slide one forearm under and come back to table top. Repeat on this side for 5 reps,.  Switch    ALSO L hand on L hip and rotate  to sky looking L     ____

## 2021-01-04 ENCOUNTER — Other Ambulatory Visit: Payer: Self-pay

## 2021-01-04 ENCOUNTER — Ambulatory Visit: Payer: Commercial Managed Care - PPO | Admitting: Physical Therapy

## 2021-01-04 DIAGNOSIS — M533 Sacrococcygeal disorders, not elsewhere classified: Secondary | ICD-10-CM

## 2021-01-04 DIAGNOSIS — M25512 Pain in left shoulder: Secondary | ICD-10-CM

## 2021-01-04 DIAGNOSIS — M25551 Pain in right hip: Secondary | ICD-10-CM

## 2021-01-04 DIAGNOSIS — G8929 Other chronic pain: Secondary | ICD-10-CM

## 2021-01-04 DIAGNOSIS — M62830 Muscle spasm of back: Secondary | ICD-10-CM

## 2021-01-04 DIAGNOSIS — M542 Cervicalgia: Secondary | ICD-10-CM

## 2021-01-04 NOTE — Patient Instructions (Addendum)
Stretches : (Cuing provided for proper alignment)  Instructions start with Strap on R    Stretches for your legs: LAYING on Back Use upper arms and elbows for stability when pulling strap Opposite knee bent and foot firm in align with hip   Strap on ballmound:  Hip socket  _strap, L knee bent, R ballmound against strap and spread toes, rolling foot 15 deg out and in across midline.  10 reps each side   Hamstring _knee bends  10 reps  With knee pointing straight ( slightly to outside to minimize snapping sensation)      10 reps with knee pointing out towards armpit ( notice the stretch in the medial hamstring muscle)     IT band  _scoot hips to R, cross R leg over L and straighten knee with strap on ballmound,   Bend knee back and forth 5x    Quad in sidelying _strap around the ankle, pulling ankle towards buttocks  Bottom leg firm and stabilization with knee bent  Adductors and pelvic floor ( Happy Baby) : Esmond Harps are wide towards armpits, sole of feet towards ceiling   On your back again:  Strap under R thigh Hip abductors ( figure 4)      ,L ankle over R thigh ( stretching L glut)     5  Breaths      Mermaid stretch  Rocking while seated on the floor with heels to one side of the hip Heels to one side of the hip  Rock forward towards the knee that is bent , rock beck towards the opposite sitting bones  Then, hand on floor, twist navel, chest, then head turns opp of knees

## 2021-01-05 NOTE — Progress Notes (Signed)
[name] [address] [place] [address] Phone: [phone] Subjective:   Bruce Donath, am serving as a scribe for Dr. Antoine Primas. This visit occurred during the SARS-CoV-2 public health emergency.  Safety protocols were in place, including screening questions prior to the visit, additional usage of staff PPE, and extensive cleaning of exam room while observing appropriate contact time as indicated for disinfecting solutions.   I'm seeing this patient by the request  of:  Bacigalupo, Marzella Schlein, MD  CC: Low back pain and pelvic pain follow-up  [id number]  Regina Gray is a 30 y.o. female coming in with complaint of back and neck pain. OMT on 12/09/2020. Was also seen for Significant tightness of the hip flexor noted on the right side.  We discussed icing regimen and home exercises, discussed avoiding certain activities.  Patient is breast-feeding at the moment and will avoid prescription medications discussed proper holding of the child.  Follow-up with me again 6 to 8 weeks.  Responded fairly well to manipulation.   Patient states that she feels similar to last visit. Patient feels like she cannot ever loosen up and that she is not gaining any strength. Patient notes popping and clicking in R hip.  Patient feels like there is instability and does not feel like she is making significant progress.  Able to start increasing activity.  Medications patient has been prescribed: None          Reviewed prior external information including notes and imaging from previsou exam, outside providers and external EMR if available.   As well as notes that were available from care everywhere and other healthcare systems.  Past medical history, social, surgical and family history all reviewed in electronic medical record.  No pertanent information unless stated regarding to the chief complaint.   Past Medical History:  Diagnosis Date    Complication of anesthesia    woke up during surgery    Allergies  Allergen Reactions   Codeine    Hydromorphone     Other reaction(s): Vomiting   Oxycodone     Other reaction(s): Vomiting   Sulfa Antibiotics      Review of Systems:  No headache, visual changes, nausea, vomiting, diarrhea, constipation, dizziness, abdominal pain, skin rash, fevers, chills, night sweats, weight loss, swollen lymph nodes, body aches, joint swelling, chest pain, shortness of breath, mood changes. POSITIVE muscle aches  Objective  Blood pressure 122/82, pulse 75, height 5\' 9"  (1.753 m), weight 191 lb (86.6 kg), currently breastfeeding.   General: No apparent distress alert and oriented x3 mood and affect normal, dressed appropriately.  HEENT: Pupils equal, extraocular movements intact  Respiratory: Patient's speak in full sentences and does not appear short of breath  Cardiovascular: No lower extremity edema, non tender, no erythema  Neuro: Cranial nerves II through XII are intact, neurovascularly intact in all extremities with 2+ DTRs and 2+ pulses.  Gait normal with good balance and coordination.  Low back exam does have loss of lordosis.  Patient's pain is out of proportion to the amount of palpation on the right side.  Mild positive straight leg test noted on the right compared to left which is a new finding.  Some mild limited range of motion of the right hip with internal range of motion.  Osteopathic findings  C2 flexed rotated and side bent right C6 flexed rotated and side bent left T3 extended rotated and side bent right inhaled rib  Assessment and Plan:  Hip flexor tightness, right Continue to have significant tightness of the right hip.  Patient does have audible popping going on as well.  Patient's hip flexor seems to be out of proportion to the amount of tightness as if anticipated.  Intermittent radicular symptoms and patient has had degenerative disc disease of the lumbar  spine previously.  I do feel that we should further evaluate with advanced imaging at this time.  Patient should be making improvement but is having difficulty.  Depending on imaging we will discuss the possibility of other types of modalities including injections or even the possibility of surgical intervention.  Hopefully we will have a more evidence and then treat as directed..   Nonallopathic problems  Decision today to treat with OMT was based on Physical Exam  After verbal consent patient was treated with HVLA, ME, FPR techniques in cervical, rib, thoracic the areas  Patient tolerated the procedure well with improvement in symptoms  Patient given exercises, stretches and lifestyle modifications  See medications in patient instructions if given  Patient will follow up in 4-8 weeks      The above documentation has been reviewed and is accurate and complete Judi Saa, DO        Note: This dictation was prepared with Dragon dictation along with smaller phrase technology. Any transcriptional errors that result from this process are unintentional.
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
## 2021-01-06 ENCOUNTER — Ambulatory Visit (INDEPENDENT_AMBULATORY_CARE_PROVIDER_SITE_OTHER): Payer: Commercial Managed Care - PPO | Admitting: Family Medicine

## 2021-01-06 ENCOUNTER — Ambulatory Visit (INDEPENDENT_AMBULATORY_CARE_PROVIDER_SITE_OTHER): Payer: Commercial Managed Care - PPO

## 2021-01-06 ENCOUNTER — Other Ambulatory Visit: Payer: Self-pay

## 2021-01-06 VITALS — BP 122/82 | HR 75 | Ht 69.0 in | Wt 191.0 lb

## 2021-01-06 DIAGNOSIS — M9908 Segmental and somatic dysfunction of rib cage: Secondary | ICD-10-CM | POA: Diagnosis not present

## 2021-01-06 DIAGNOSIS — M9903 Segmental and somatic dysfunction of lumbar region: Secondary | ICD-10-CM

## 2021-01-06 DIAGNOSIS — M9902 Segmental and somatic dysfunction of thoracic region: Secondary | ICD-10-CM

## 2021-01-06 DIAGNOSIS — M24551 Contracture, right hip: Secondary | ICD-10-CM

## 2021-01-06 DIAGNOSIS — M9901 Segmental and somatic dysfunction of cervical region: Secondary | ICD-10-CM

## 2021-01-06 IMAGING — DX DG LUMBAR SPINE 2-3V
3 series · 3 of 3 positions shown · non-contrast
Comparison: X-ray lumbar spine [DATE]

CLINICAL DATA: Chronic right hip pain that radiates into right side
and into tailbone area. Pt states it began after her first child was
born.

EXAM:
LUMBAR SPINE - 2-3 VIEW

[l-spine ap]
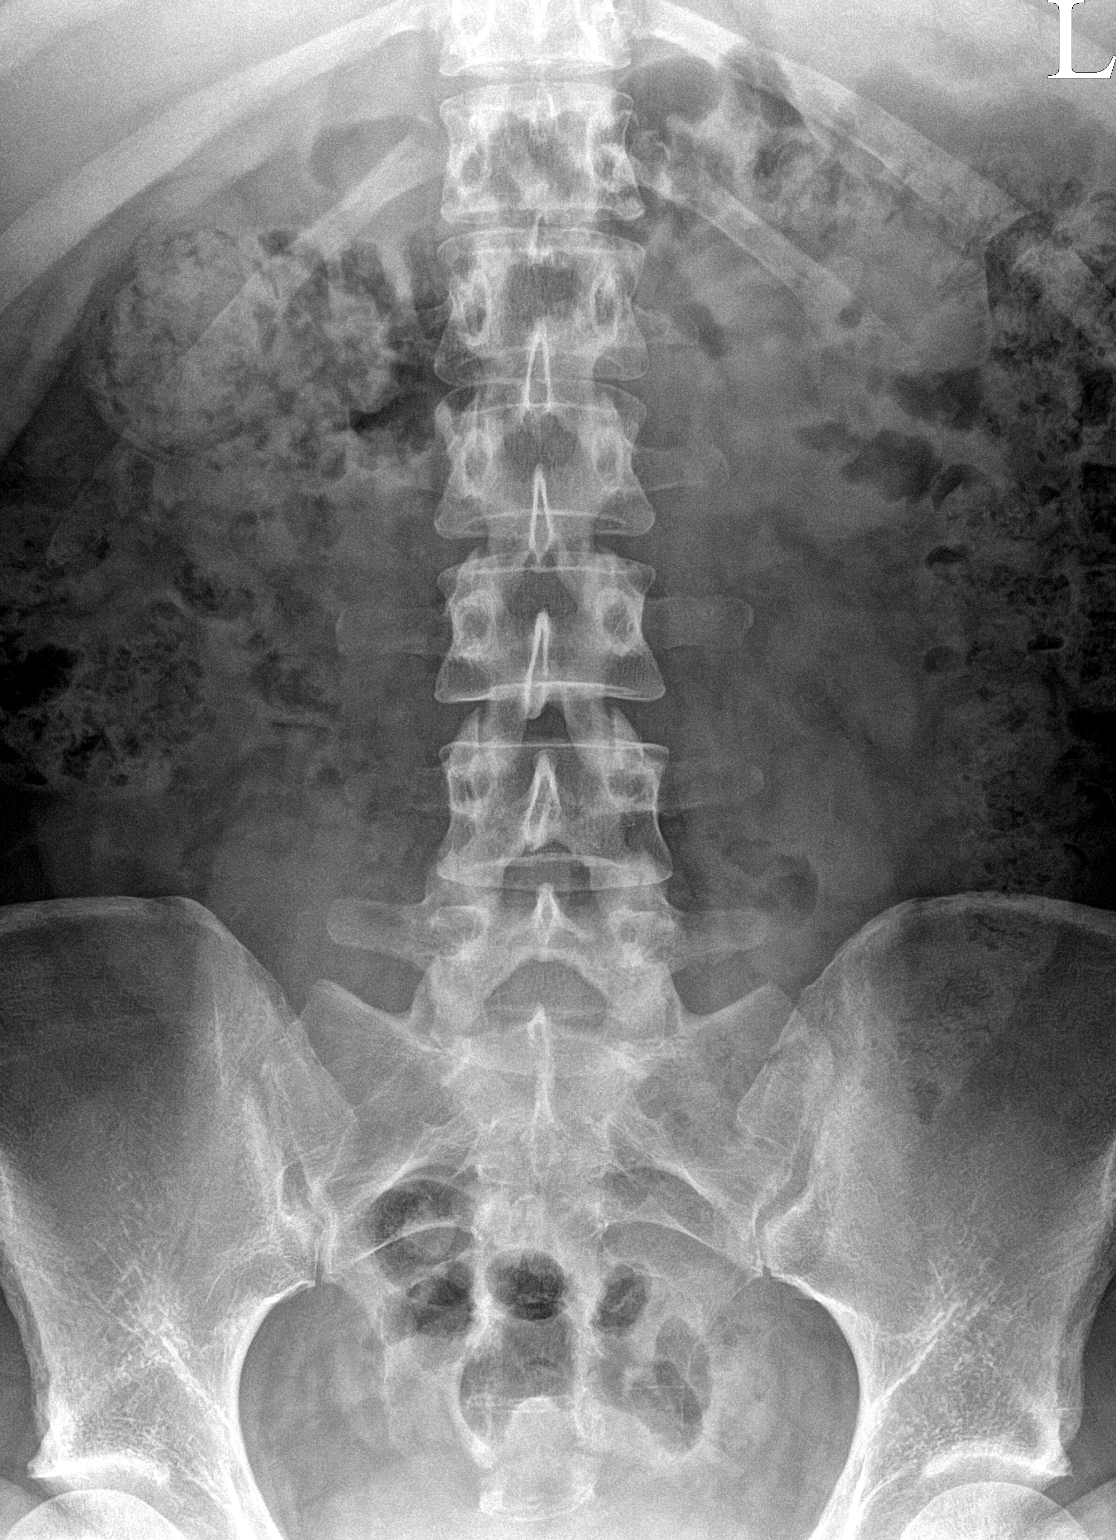

[l-spine lateral]
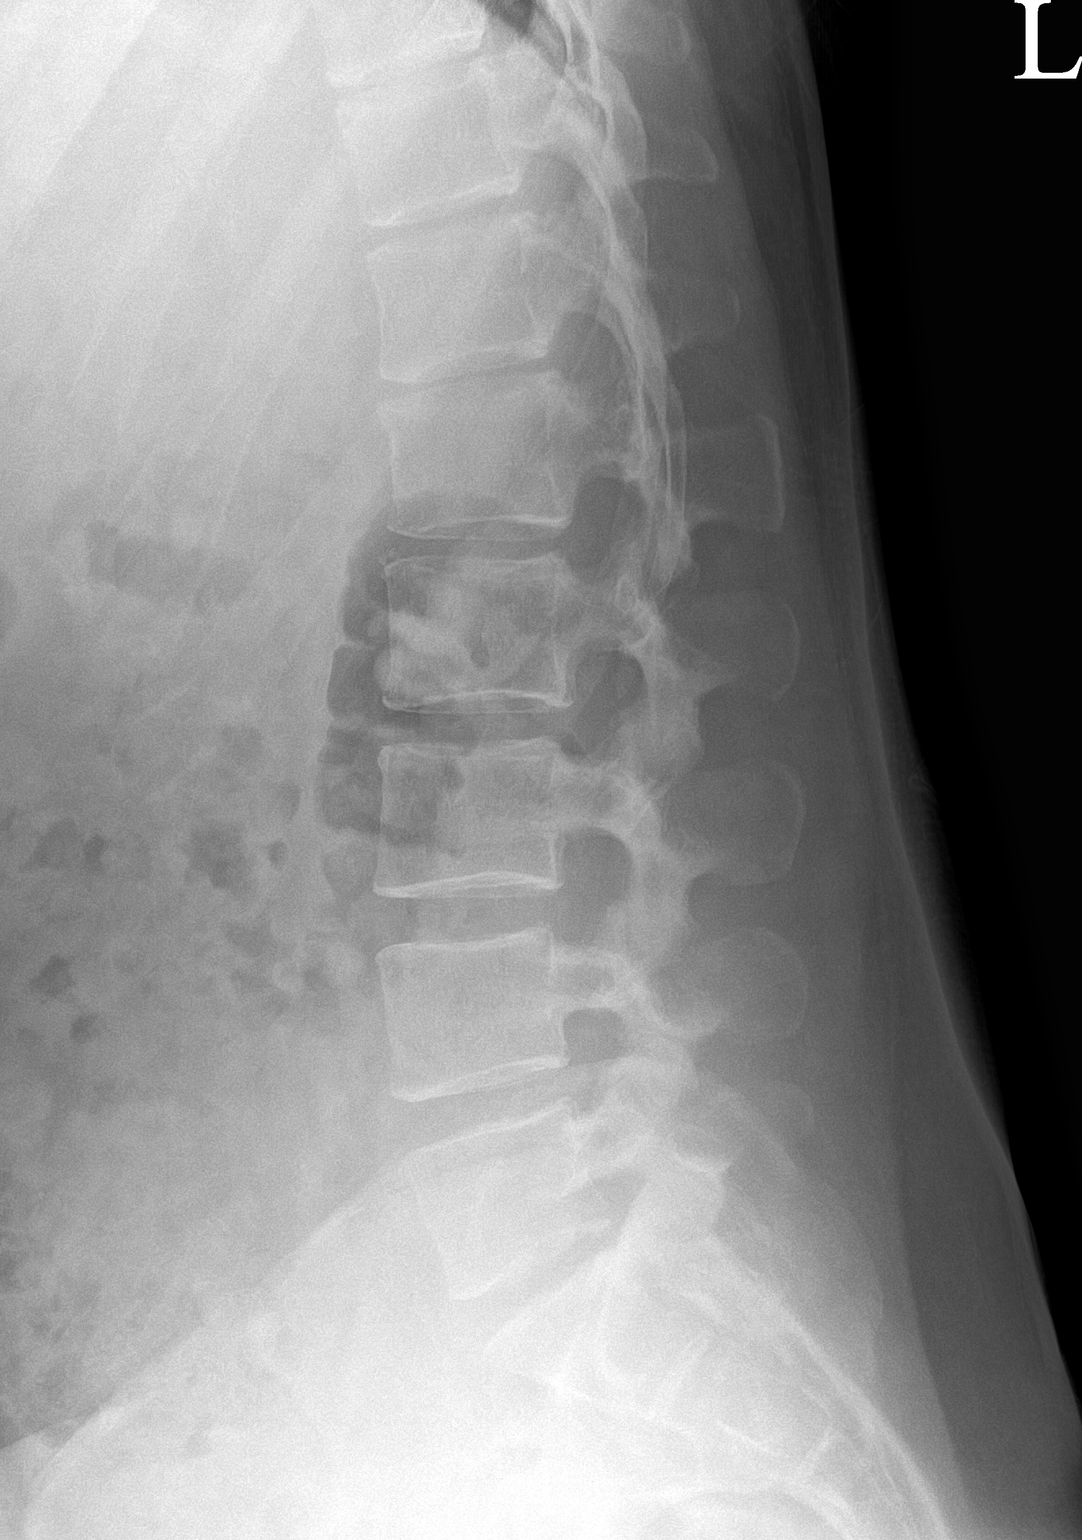

[l-spine spot]
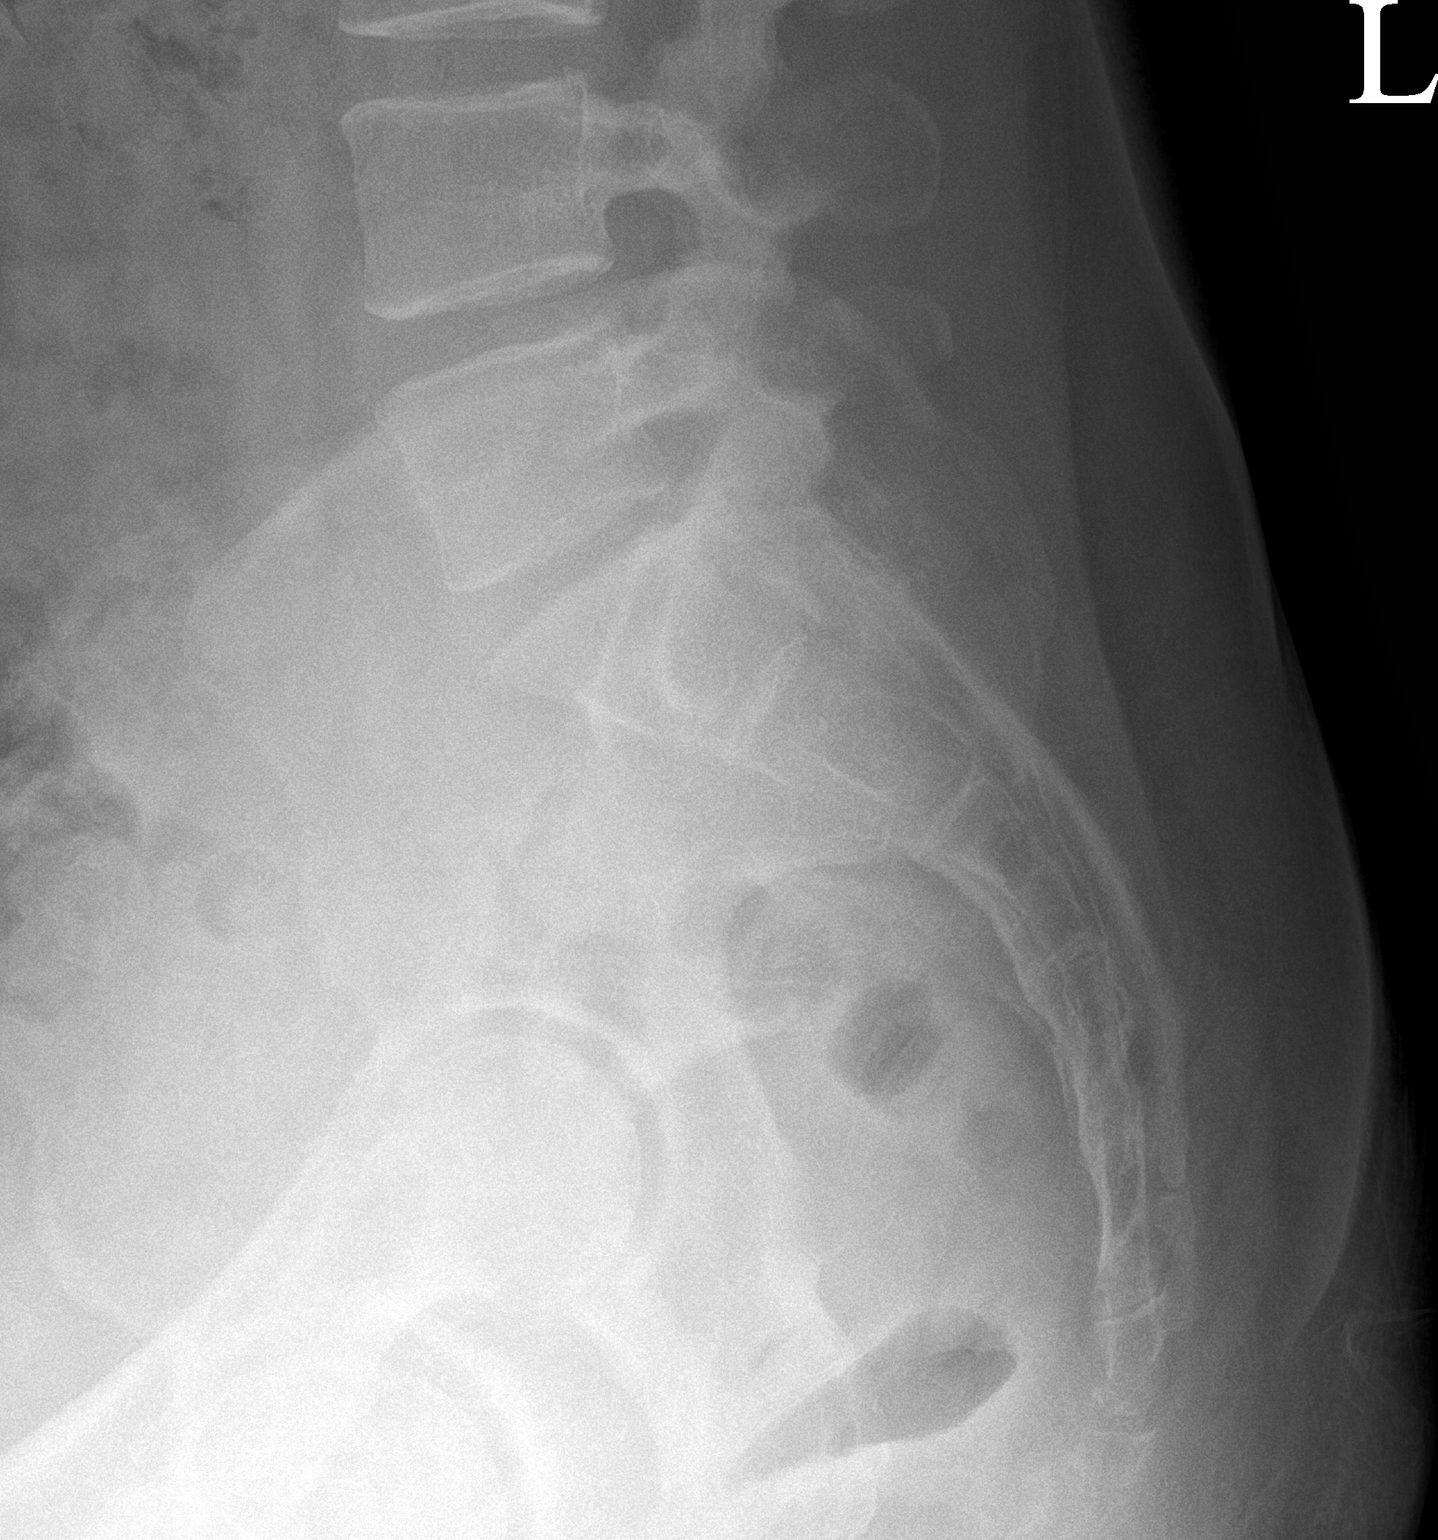

[3 of 3 positions shown; findings below may reference images not displayed]

FINDINGS: Five non-rib-bearing lumbar vertebral bodies. There is no evidence
of lumbar spine fracture. Alignment is normal. Intervertebral disc
spaces are maintained.
IMPRESSION: Negative.

## 2021-01-06 NOTE — Patient Instructions (Signed)
[facility] [phone] Call Today  When we receive your results we will contact you.  Continue everything else for now See you again in 6 weeks

## 2021-01-06 NOTE — Assessment & Plan Note (Signed)
Continue to have significant tightness of the right hip.  Patient does have audible popping going on as well.  Patient's hip flexor seems to be out of proportion to the amount of tightness as if anticipated.  Intermittent radicular symptoms and patient has had degenerative disc disease of the lumbar spine previously.  I do feel that we should further evaluate with advanced imaging at this time.  Patient should be making improvement but is having difficulty.  Depending on imaging we will discuss the possibility of other types of modalities including injections or even the possibility of surgical intervention.  Hopefully we will have a more evidence and then treat as directed.Marland Kitchen

## 2021-01-10 NOTE — Addendum Note (Signed)
Addended by: Edwena Felty T on: 01/10/2021 12:31 PM   Modules accepted: Orders

## 2021-01-14 ENCOUNTER — Other Ambulatory Visit: Payer: Self-pay

## 2021-01-14 ENCOUNTER — Ambulatory Visit: Payer: Commercial Managed Care - PPO | Admitting: Physical Therapy

## 2021-01-14 DIAGNOSIS — M25551 Pain in right hip: Secondary | ICD-10-CM

## 2021-01-14 DIAGNOSIS — M62830 Muscle spasm of back: Secondary | ICD-10-CM

## 2021-01-14 DIAGNOSIS — M542 Cervicalgia: Secondary | ICD-10-CM

## 2021-01-14 DIAGNOSIS — M25512 Pain in left shoulder: Secondary | ICD-10-CM

## 2021-01-14 DIAGNOSIS — M533 Sacrococcygeal disorders, not elsewhere classified: Secondary | ICD-10-CM

## 2021-01-14 DIAGNOSIS — G8929 Other chronic pain: Secondary | ICD-10-CM
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
## 2021-01-14 NOTE — Patient Instructions (Signed)
   _________  Feet slides :   Points of contact at sitting bones  Four points of contact of foot, Heel up, ankle not twist out Lower heel Four points of contact of foot, Slide foot back   Repeated with other foot    __  Walking with higher knees to promote dorsiflexion of ankle

## 2021-01-18 ENCOUNTER — Ambulatory Visit: Payer: Commercial Managed Care - PPO | Admitting: Physical Therapy

## 2021-01-18 ENCOUNTER — Other Ambulatory Visit: Payer: Self-pay

## 2021-01-18 DIAGNOSIS — G8929 Other chronic pain: Secondary | ICD-10-CM

## 2021-01-18 DIAGNOSIS — M542 Cervicalgia: Secondary | ICD-10-CM

## 2021-01-18 DIAGNOSIS — M25551 Pain in right hip: Secondary | ICD-10-CM

## 2021-01-18 DIAGNOSIS — M533 Sacrococcygeal disorders, not elsewhere classified: Secondary | ICD-10-CM | POA: Diagnosis not present

## 2021-01-18 DIAGNOSIS — M62830 Muscle spasm of back: Secondary | ICD-10-CM
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
## 2021-01-26 ENCOUNTER — Ambulatory Visit
Admission: RE | Admit: 2021-01-26 | Discharge: 2021-01-26 | Disposition: A | Discharge status: Home / Self Care | Payer: Commercial Managed Care - PPO | Source: Ambulatory Visit | Attending: Family Medicine | Admitting: Family Medicine

## 2021-01-26 ENCOUNTER — Other Ambulatory Visit: Payer: Self-pay

## 2021-01-26 DIAGNOSIS — M9903 Segmental and somatic dysfunction of lumbar region: Secondary | ICD-10-CM

## 2021-01-26 IMAGING — MR MR PELVIS WO/W CM
6 of 8 series · 33 of 48 positions shown · IV contrast (multihance)
Comparison: None.

CLINICAL DATA: Right low back and groin pain

EXAM:
MRI PELVIS WITHOUT AND WITH CONTRAST
TECHNIQUE: Multiplanar multisequence MR imaging of the pelvis was performed
both before and after administration of intravenous contrast.
CONTRAST:  18mL MULTIHANCE GADOBENATE DIMEGLUMINE 529 MG/ML IV SOLN

[Series 2: T2 fat-sat · sagittal · 4.0mm · 0.47mm/px · 9 of 65 slices shown (1 of 2)]
[im 1/65]
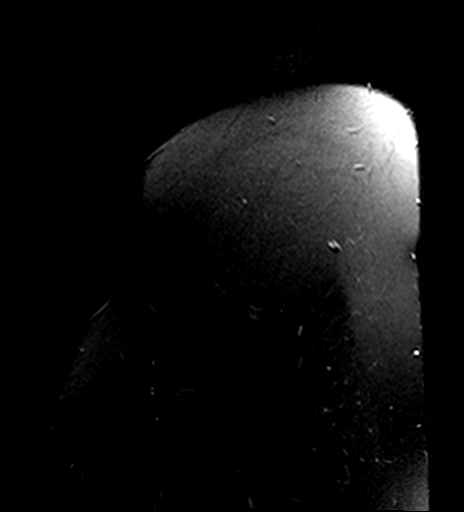
[im 9/65]
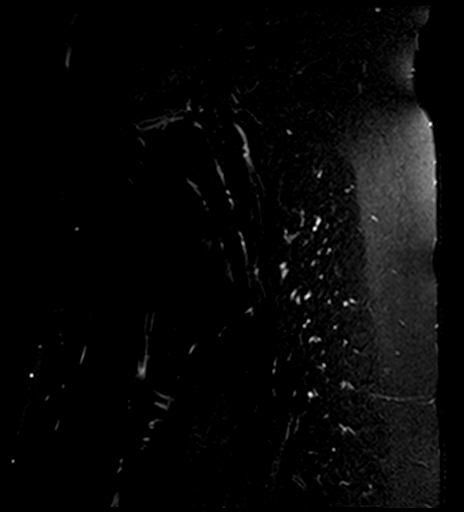
[im 17/65]
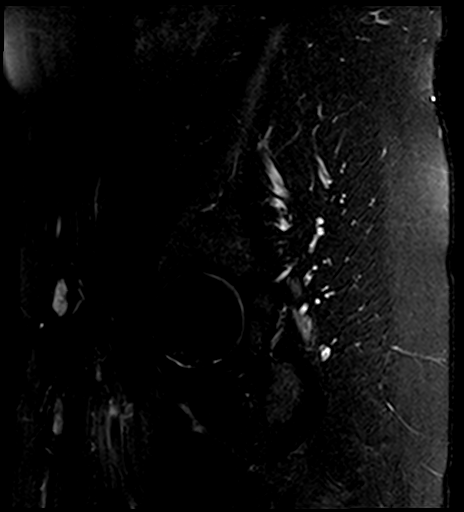
[im 25/65]
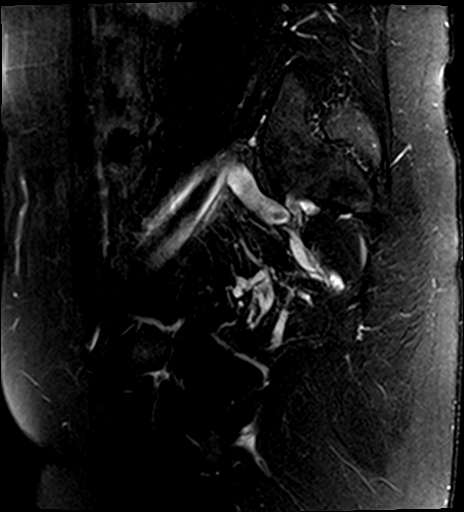
[im 33/65]
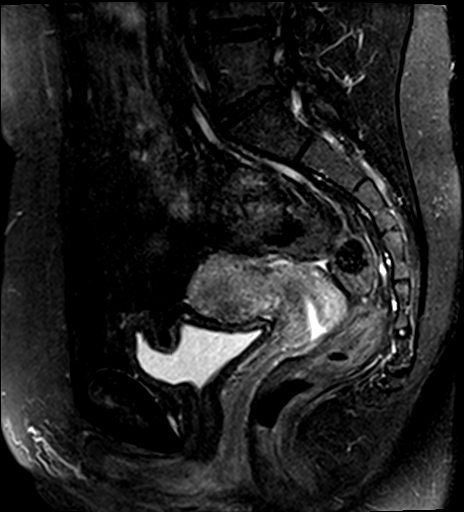
[im 41/65]
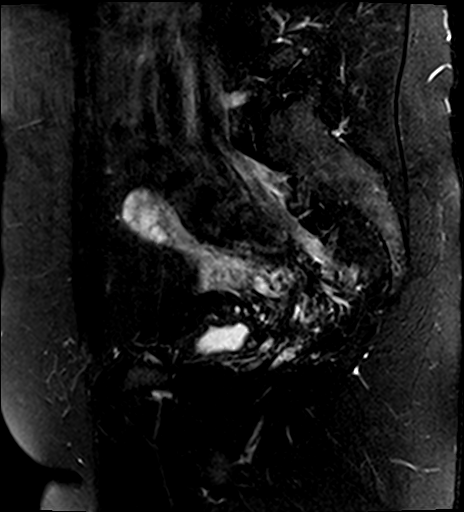
[im 49/65]
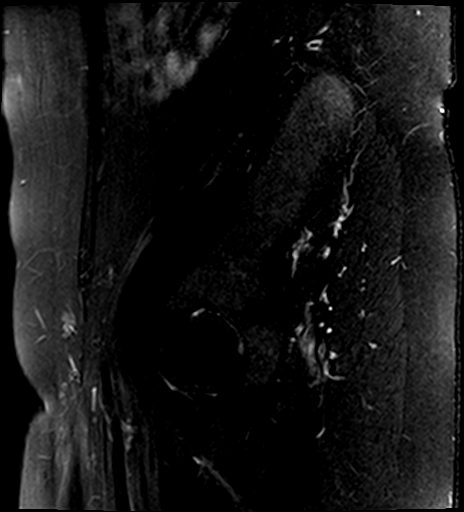
[im 57/65]
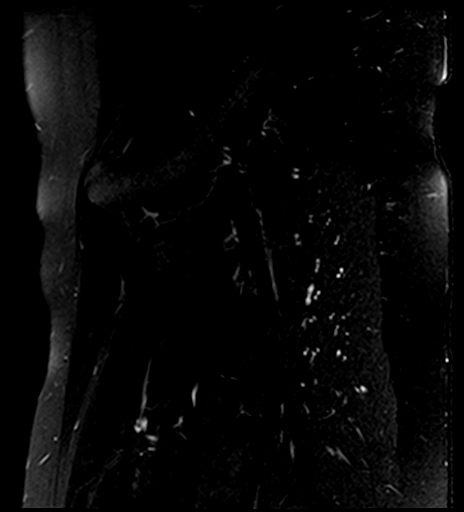
[im 65/65]
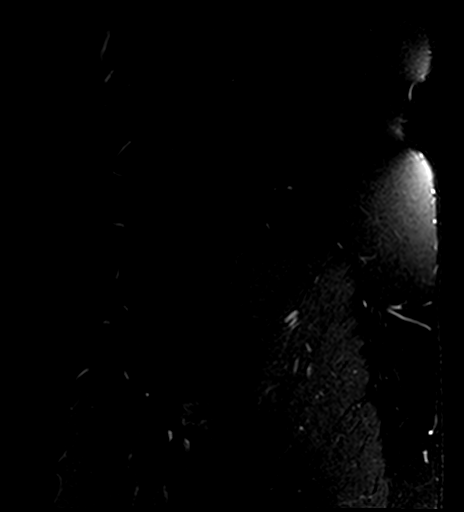

[Series 3: T1 · coronal · 4.0mm · 1.56mm/px · 5 of 36 slices shown (1 of 2)]
[im 1/36]
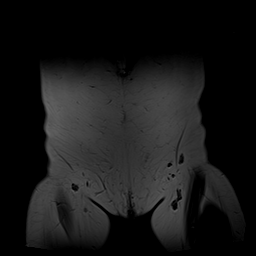
[im 9/36]
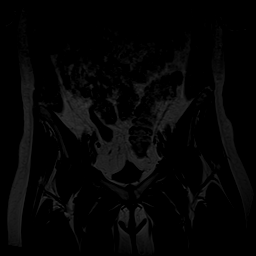
[im 18/36]
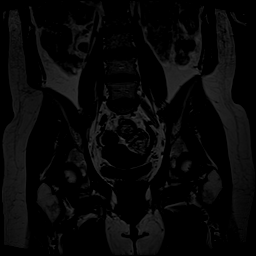
[im 27/36]
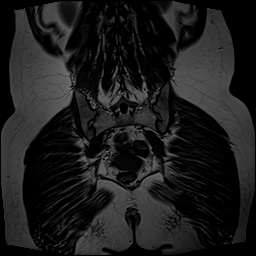
[im 36/36]
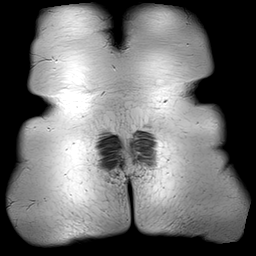

[Series 4: STIR · coronal · 4.0mm · 1.56mm/px · 5 of 36 slices shown]
[im 1/36]
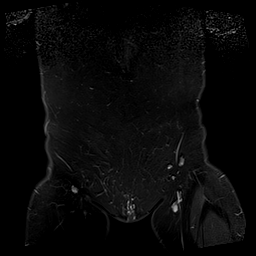
[im 9/36]
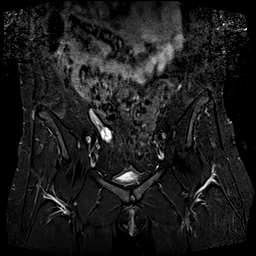
[im 18/36]
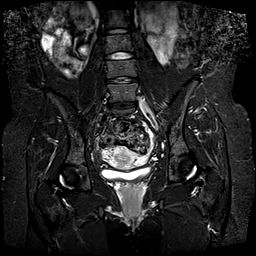
[im 27/36]
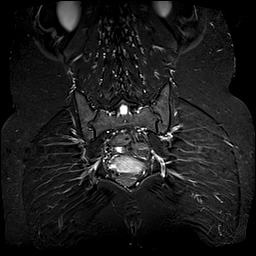
[im 36/36]
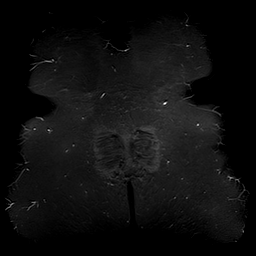

[Series 5: T1 · axial · 4.0mm · 0.59mm/px · z∈[-258,-18]mm · 6 of 49 slices shown (2 of 2)]
[im 1/49]
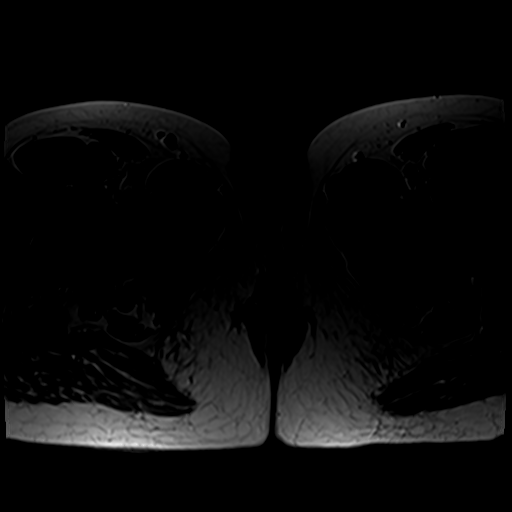
[im 10/49]
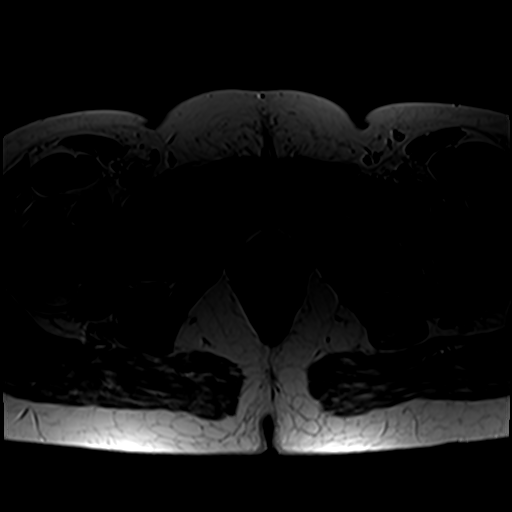
[im 20/49]
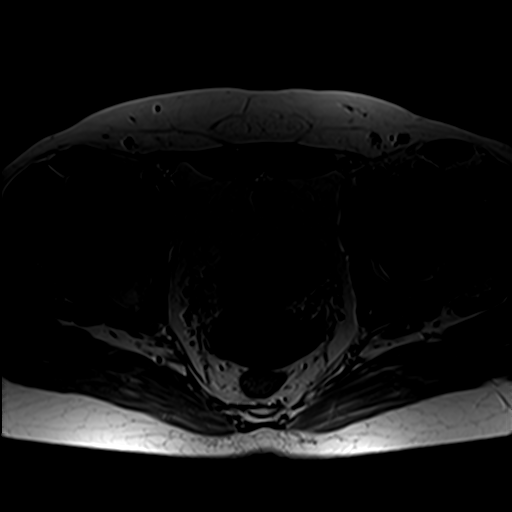
[im 29/49]
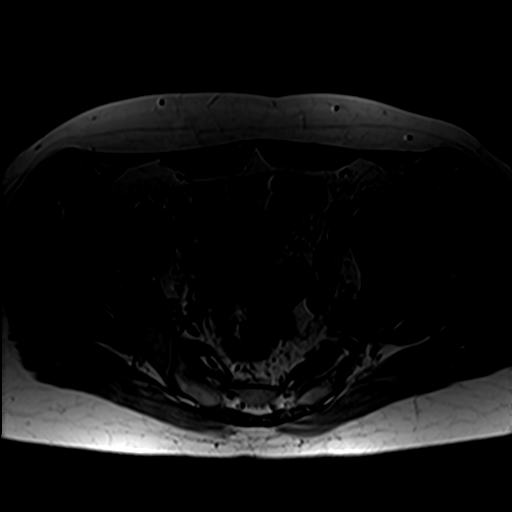
[im 39/49]
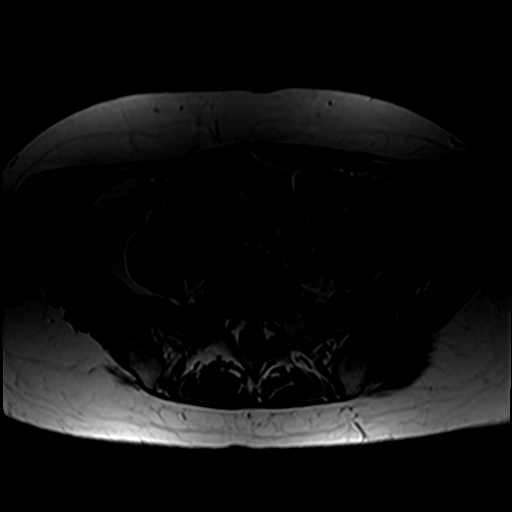
[im 49/49]
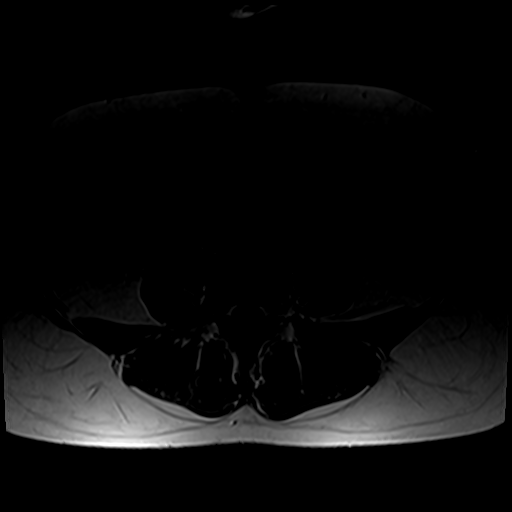

[Series 6: T2 fat-sat · axial · 4.0mm · 1.17mm/px · z∈[-258,-18]mm · 6 of 49 slices shown (2 of 2)]
[im 1/49]
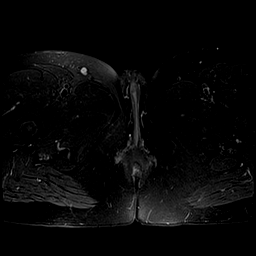
[im 10/49]
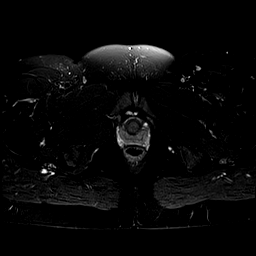
[im 20/49]
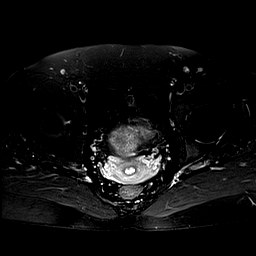
[im 29/49]
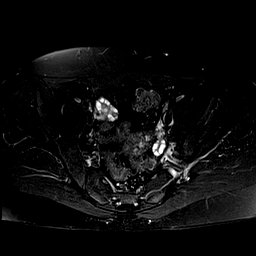
[im 39/49]
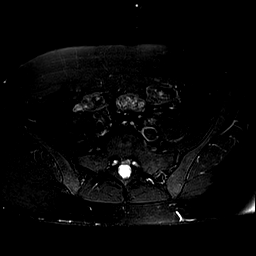
[im 49/49]
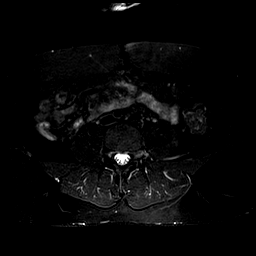

[Series 7: T1 fat-sat · axial · 4.0mm · 1.17mm/px · z∈[-258,-213]mm · 2 of 49 slices shown]
[im 1/49]
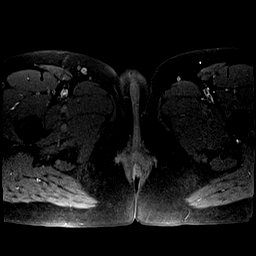
[im 10/49]
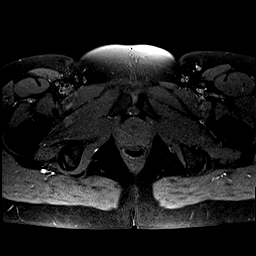

[33 of 48 positions shown; findings below may reference images not displayed]

FINDINGS: Urinary Tract:  No abnormality visualized.

Bowel:  Unremarkable visualized pelvic bowel loops.

Vascular/Lymphatic: No pathologically enlarged lymph nodes. No
significant vascular abnormality seen.

Reproductive: No mass or other significant abnormality. Multiple
small ovarian follicles bilaterally.

Other:  None.

Musculoskeletal: No suspicious bone lesions identified. Probable
annular tear and disc protrusion at L5-S1 (series 2, image 31,
series 8, image 10).
IMPRESSION: 1.  No MR findings of the pelvis to explain right groin pain.

2. Probable annular tear and disc protrusion at L5-S1. Please see
separately reported examination of the lumbar spine.

## 2021-01-26 MED ORDER — GADOBENATE DIMEGLUMINE 529 MG/ML IV SOLN
18.0000 mL | Freq: Once | INTRAVENOUS | Status: AC | PRN
  Administered 2021-01-26: 18 mL via INTRAVENOUS

## 2021-01-27 ENCOUNTER — Ambulatory Visit: Payer: Commercial Managed Care - PPO | Attending: Family Medicine | Admitting: Physical Therapy

## 2021-01-27 DIAGNOSIS — M62830 Muscle spasm of back: Secondary | ICD-10-CM | POA: Diagnosis present

## 2021-01-27 DIAGNOSIS — M533 Sacrococcygeal disorders, not elsewhere classified: Secondary | ICD-10-CM | POA: Insufficient documentation

## 2021-01-27 DIAGNOSIS — G8929 Other chronic pain: Secondary | ICD-10-CM | POA: Diagnosis present

## 2021-01-27 DIAGNOSIS — M25551 Pain in right hip: Secondary | ICD-10-CM | POA: Insufficient documentation

## 2021-01-27 DIAGNOSIS — M25512 Pain in left shoulder: Secondary | ICD-10-CM | POA: Insufficient documentation

## 2021-01-27 DIAGNOSIS — M542 Cervicalgia: Secondary | ICD-10-CM | POA: Insufficient documentation
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
## 2021-01-27 NOTE — Patient Instructions (Addendum)
    Letter T, seeasaw on one leg with band band under L foot, wrap by big toe then, outer knee/ thigh, L hand pulling ( elbow by side)  Plant ballmound, toes spread, thigh out against band,, tracking knee in line with 2-3rd toe line Dipping forward, R foot lifts slight ( whole body like a see saw) off floor or  Press back foot against wall 20 reps  Both      ___  Feet care :  Self -feet massage   Handshake : fingers between toes, moving ballmounds/toes back and forth several times while other hand anchors at arch. Do the same at the hind/mid foot.  Heel to toes upward to a letter Big Letter T strokes to spread ballmounds and toes, several times, pinch between webs of toes  Run finger tips along top of foot between long bones "comb between the bones"    Wiggle toes and spread them out when relaxing

## 2021-02-03 ENCOUNTER — Ambulatory Visit: Payer: Commercial Managed Care - PPO | Admitting: Physical Therapy

## 2021-02-10 ENCOUNTER — Ambulatory Visit: Payer: Commercial Managed Care - PPO | Admitting: Physical Therapy

## 2021-02-10 ENCOUNTER — Encounter: Payer: Self-pay | Admitting: Physical Therapy

## 2021-02-10 ENCOUNTER — Other Ambulatory Visit: Payer: Self-pay

## 2021-02-10 DIAGNOSIS — M542 Cervicalgia: Secondary | ICD-10-CM

## 2021-02-10 DIAGNOSIS — M25551 Pain in right hip: Secondary | ICD-10-CM | POA: Diagnosis not present

## 2021-02-10 DIAGNOSIS — G8929 Other chronic pain: Secondary | ICD-10-CM

## 2021-02-10 DIAGNOSIS — M533 Sacrococcygeal disorders, not elsewhere classified: Secondary | ICD-10-CM

## 2021-02-10 DIAGNOSIS — M25512 Pain in left shoulder: Secondary | ICD-10-CM

## 2021-02-10 DIAGNOSIS — M62830 Muscle spasm of back: Secondary | ICD-10-CM
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
## 2021-02-10 NOTE — Addendum Note (Signed)
Addended by: Mariane Masters on: 02/10/2021 05:19 PM   Modules accepted: Orders

## 2021-02-11 ENCOUNTER — Encounter: Payer: Self-pay | Admitting: Family Medicine

## 2021-02-15 NOTE — Progress Notes (Signed)
[name] [address] [place] [address] Phone: [phone] Subjective:   Regina Gray, am serving as a scribe for Dr. Antoine Primas.  This visit occurred during the SARS-CoV-2 public health emergency.  Safety protocols were in place, including screening questions prior to the visit, additional usage of staff PPE, and extensive cleaning of exam room while observing appropriate contact time as indicated for disinfecting solutions.   I'm seeing this patient by the request  of:  Bacigalupo, Marzella Schlein, MD  CC: back pain new right sided knee pain   [id number]  Regina Gray is a 30 y.o. female coming in with complaint of back and neck pain. OMT on 01/06/2021. Complains of knee pain. Patient states that she had ACL repair on L knee 5 years ago. Feels like she overcompensates with R leg. Patient recently went on vacation and played some beach volleyball which caused some R knee pain. No true injury. Recently swung a bat playing with her son and felt sharp pain in knee. Pain over lateral proximal tibia. Pain also present in hamstring that can radiate into glute.   Medications patient has been prescribed: None            Past Medical History:  Diagnosis Date   Complication of anesthesia    woke up during surgery    Allergies  Allergen Reactions   Codeine    Hydromorphone     Other reaction(s): Vomiting   Oxycodone     Other reaction(s): Vomiting   Sulfa Antibiotics      Review of Systems:  No headache, visual changes, nausea, vomiting, diarrhea, constipation, dizziness, abdominal pain, skin rash, fevers, chills, night sweats, weight loss, swollen lymph nodes, body aches, joint swelling, chest pain, shortness of breath, mood changes. POSITIVE muscle aches  Objective  Blood pressure 106/80, pulse 87, height 5\' 9"  (1.753 m), weight 192 lb (87.1 kg), SpO2 98 %, currently breastfeeding.   General: No apparent distress alert and  oriented x3 mood and affect normal, dressed appropriately.  HEENT: Pupils equal, extraocular movements intact  Respiratory: Patient's speak in full sentences and does not appear short of breath  Cardiovascular: No lower extremity edema, non tender, no erythema  Back exam still shows loss of lordosis.  Pain is out of proportion to the amount of palpation.  Negative straight leg test. Right knee exam shows patient has more pain on the lateral aspect of the knee.  ACL appears to be intact.  No significant instability with valgus or varus force.  No swelling noted.  Limited muscular skeletal ultrasound was performed and interpreted by , M  Limited ultrasound of patient's right knee shows there is no swelling of the patellofemoral joint.  Medial meniscus appears to be unremarkable.  Patient does have significant amount of hypoechoic changes around the popliteal tendon noted.  No abnormal vascularity.  Mild calcific changes of the tendon noted.  [id number]; 15 additional minutes spent for Therapeutic exercises as stated in above notes.  This included exercises focusing on stretching, strengthening, with significant focus on eccentric aspects.   Long term goals include an improvement in range of motion, strength, endurance as well as avoiding reinjury. Patient's frequency would include in 1-2 times a day, 3-5 times a week for a duration of 6-12 weeks.    exercises that included:  Pelvic tilt/bracing instruction to focus on control of the pelvic girdle and lower abdominal muscles  Glute strengthening exercises, focusing on proper firing of  the glutes without engaging the low back muscles Proper stretching techniques for maximum relief for the hamstrings, hip flexors, low back and some rotation where tolerated Proper technique shown and discussed handout in great detail with ATC.  All questions were discussed and answered.      Assessment and Plan:      The above documentation has been  reviewed and is accurate and complete Judi Saa, DO        Note: This dictation was prepared with Dragon dictation along with smaller phrase technology. Any transcriptional errors that result from this process are unintentional.

## 2021-02-16 ENCOUNTER — Ambulatory Visit (INDEPENDENT_AMBULATORY_CARE_PROVIDER_SITE_OTHER): Payer: Commercial Managed Care - PPO | Admitting: Family Medicine

## 2021-02-16 ENCOUNTER — Encounter: Payer: Self-pay | Admitting: Family Medicine

## 2021-02-16 ENCOUNTER — Ambulatory Visit: Payer: Self-pay

## 2021-02-16 ENCOUNTER — Ambulatory Visit (INDEPENDENT_AMBULATORY_CARE_PROVIDER_SITE_OTHER): Payer: Commercial Managed Care - PPO

## 2021-02-16 ENCOUNTER — Other Ambulatory Visit: Payer: Self-pay

## 2021-02-16 VITALS — BP 106/80 | HR 87 | Ht 69.0 in | Wt 192.0 lb

## 2021-02-16 DIAGNOSIS — M79609 Pain in unspecified limb: Secondary | ICD-10-CM

## 2021-02-16 DIAGNOSIS — M5137 Other intervertebral disc degeneration, lumbosacral region: Secondary | ICD-10-CM | POA: Insufficient documentation

## 2021-02-16 DIAGNOSIS — M25561 Pain in right knee: Secondary | ICD-10-CM | POA: Diagnosis not present

## 2021-02-16 IMAGING — DX DG KNEE 3 VIEWS*R*
3 series · 3 of 3 positions shown · non-contrast
Comparison: None

CLINICAL DATA: Right knee pain, unspecified chronicity. Additional
history provided: Patient reports chronic right knee pain,
worsening.

EXAM:
RIGHT KNEE - 3 VIEW

[knee ap]
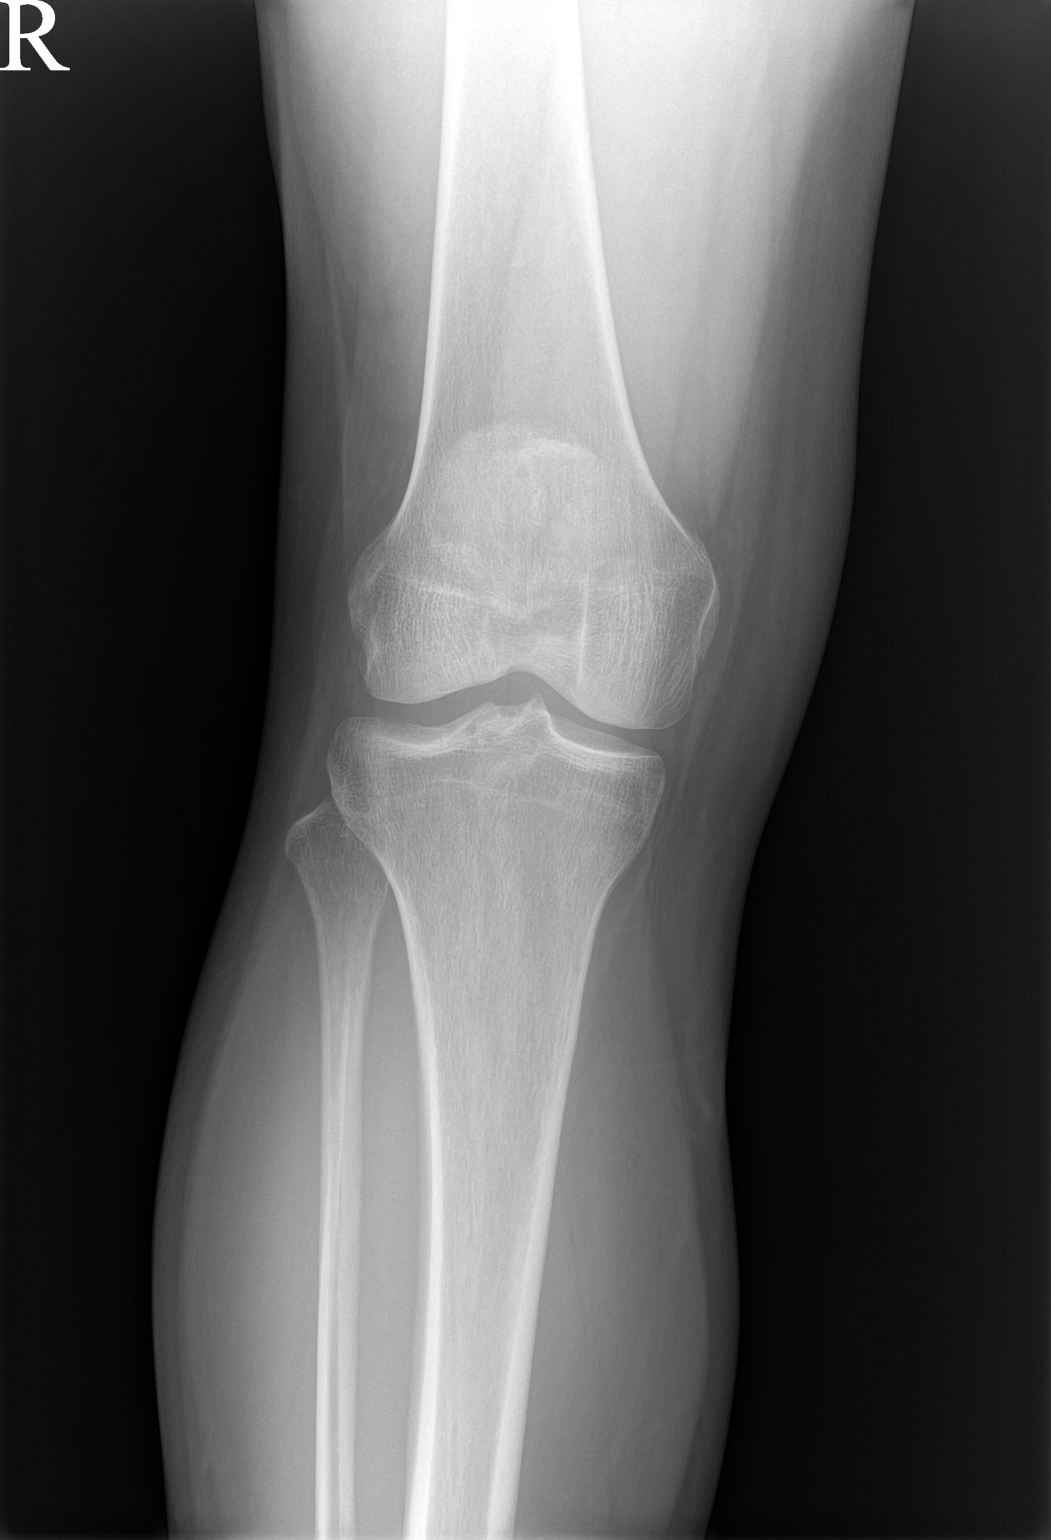

[knee lat]
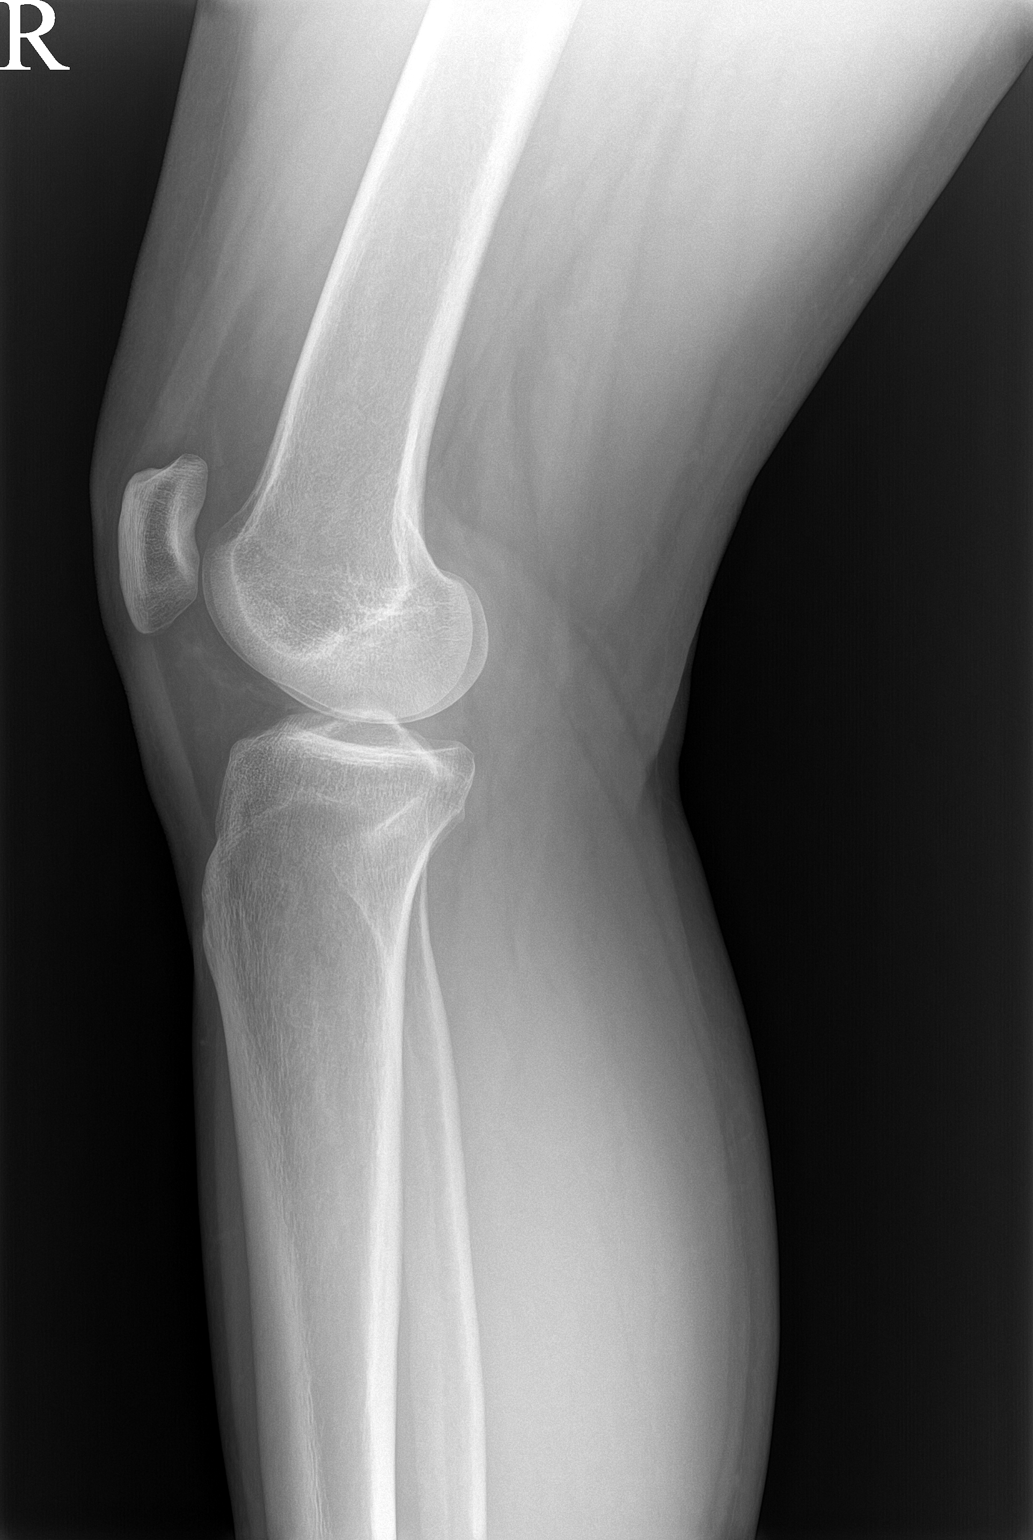

[patella]
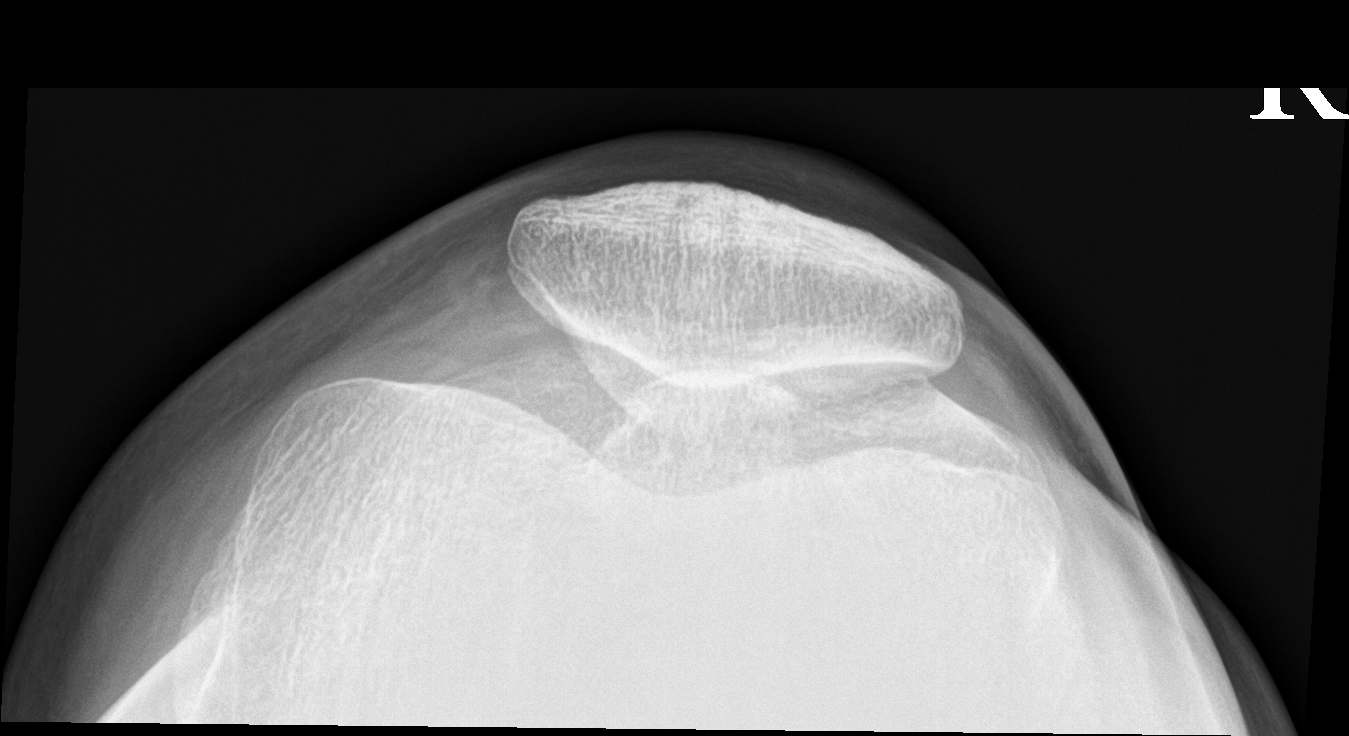

[3 of 3 positions shown; findings below may reference images not displayed]

FINDINGS: No evidence of fracture. No significant joint space narrowing.
Borderline patella Alta.
IMPRESSION: Borderline patella Alta.

Otherwise unremarkable exam.

## 2021-02-16 NOTE — Assessment & Plan Note (Signed)
Patient does have some mild degenerative changes.  Does have a S1 nerve root impingement.  Patient declined epidural and would like to continue with conservative therapy.  We will continue to monitor and follow-up again in 6 weeks.

## 2021-02-16 NOTE — Assessment & Plan Note (Signed)
Popliteal tendinitis.  Recommended knee compression, home exercises and work with aquatic training.  Discussed icing regimen.  Follow-up with me again in 6 weeks.  Worsening pain may need physical therapy.

## 2021-02-16 NOTE — Patient Instructions (Addendum)
Xray today Popliteal tendonitis Heels lifts Knee compression sleeve SI joint exercises See me again in 6 weeks

## 2021-02-17 ENCOUNTER — Ambulatory Visit: Payer: Commercial Managed Care - PPO | Admitting: Physical Therapy

## 2021-02-17 DIAGNOSIS — M25551 Pain in right hip: Secondary | ICD-10-CM

## 2021-02-17 DIAGNOSIS — M62830 Muscle spasm of back: Secondary | ICD-10-CM

## 2021-02-17 DIAGNOSIS — G8929 Other chronic pain: Secondary | ICD-10-CM

## 2021-02-17 DIAGNOSIS — M25512 Pain in left shoulder: Secondary | ICD-10-CM

## 2021-02-17 DIAGNOSIS — M542 Cervicalgia: Secondary | ICD-10-CM
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
## 2021-02-17 NOTE — Patient Instructions (Signed)
Stretch for pelvic floor   V- slides  "v heels slide away and then back toward buttocks and then rock knee to slight ,  slide heel along at 11 o clock away from buttocks   10 reps      On belly: Riding horse edge of mattress  knee bent like riding a horse, move knee towards armpit and out  10 reps   

## 2021-02-24 ENCOUNTER — Other Ambulatory Visit: Payer: Self-pay

## 2021-02-24 ENCOUNTER — Ambulatory Visit: Payer: Commercial Managed Care - PPO | Attending: Certified Nurse Midwife | Admitting: Physical Therapy

## 2021-02-24 DIAGNOSIS — M25512 Pain in left shoulder: Secondary | ICD-10-CM | POA: Diagnosis present

## 2021-02-24 DIAGNOSIS — M62830 Muscle spasm of back: Secondary | ICD-10-CM | POA: Insufficient documentation

## 2021-02-24 DIAGNOSIS — G8929 Other chronic pain: Secondary | ICD-10-CM | POA: Diagnosis present

## 2021-02-24 DIAGNOSIS — M542 Cervicalgia: Secondary | ICD-10-CM | POA: Insufficient documentation

## 2021-02-24 DIAGNOSIS — M533 Sacrococcygeal disorders, not elsewhere classified: Secondary | ICD-10-CM | POA: Insufficient documentation

## 2021-02-24 DIAGNOSIS — M25551 Pain in right hip: Secondary | ICD-10-CM | POA: Diagnosis not present
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
## 2021-02-24 NOTE — Patient Instructions (Signed)
Multifidis twist  Band is on doorknob: sit further away back of chair,  door (facing perpendicular)   Twisting trunk without moving the hips and knees Hold band at the level of ribcage, elbows bent,shoulder blades roll back and down like squeezing a pencil under armpit    Exhale twist,.10-15 deg away from door without moving your hips/ knees. Continue to maintain equal weight through legs. Keep knee unlocked.  20

## 2021-03-10 ENCOUNTER — Encounter: Payer: Commercial Managed Care - PPO | Admitting: Physical Therapy

## 2021-03-15 ENCOUNTER — Other Ambulatory Visit: Payer: Self-pay

## 2021-03-15 ENCOUNTER — Ambulatory Visit: Payer: Commercial Managed Care - PPO | Admitting: Physical Therapy

## 2021-03-15 DIAGNOSIS — M533 Sacrococcygeal disorders, not elsewhere classified: Secondary | ICD-10-CM

## 2021-03-15 DIAGNOSIS — M62830 Muscle spasm of back: Secondary | ICD-10-CM

## 2021-03-15 DIAGNOSIS — M25551 Pain in right hip: Secondary | ICD-10-CM | POA: Diagnosis not present

## 2021-03-15 DIAGNOSIS — G8929 Other chronic pain: Secondary | ICD-10-CM

## 2021-03-15 DIAGNOSIS — M542 Cervicalgia: Secondary | ICD-10-CM

## 2021-03-15 NOTE — Patient Instructions (Signed)
Traction with yoga belt  ([email or web address]) 9 feet long Down ward facing dog with strap on other side of the door  Chair in front for hands Knees slightly bent and allow the belt to pull hips back     Hands on kitchen counter,   Palms shoulder width apart  Minisquat postion Trunk is parallel to floor  A) Pull buttocks back to lengthen spine, knees bent  3 breaths   B) Bring R hand to the L, and stretch the R side trunk  3 breaths   Brings hands to center again Do the same to the L side stretch by placing L hand on top of R   D) Modified thread the needle R hand on L thigh, L  thigh pushing out slightly as the R hands pull in,  elbow bent and pulls to theR,  Look under L armpit   Do the same to other side  ____     Side of hip stretch:  Reclined twist for hips and side of the hips/ legs  Lay on your back, knees bend Scoot hips to the R , leave shoulders in place Drop knees to the L side resting onto pillows to keep leg at the same width of hips Pillow under L thigh to minimize too much strain   ___   Bear stretch with strap at door way,  ___ Breastfeeding on R side as well Throw pillow under R arm if L sidelying, pillow under L knee and between knees  Back support pregnancy pillow behind back  Complimentary rotation afterwards
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
## 2021-03-22 ENCOUNTER — Encounter: Payer: Self-pay | Admitting: Certified Nurse Midwife

## 2021-03-22 ENCOUNTER — Ambulatory Visit (INDEPENDENT_AMBULATORY_CARE_PROVIDER_SITE_OTHER): Payer: Commercial Managed Care - PPO | Admitting: Certified Nurse Midwife

## 2021-03-22 ENCOUNTER — Other Ambulatory Visit: Payer: Self-pay

## 2021-03-22 VITALS — BP 127/84 | HR 78 | Ht 69.0 in | Wt 188.7 lb

## 2021-03-22 DIAGNOSIS — Z01419 Encounter for gynecological examination (general) (routine) without abnormal findings: Secondary | ICD-10-CM

## 2021-03-22 NOTE — Progress Notes (Signed)
GYNECOLOGY ANNUAL PREVENTATIVE CARE ENCOUNTER NOTE  History:     Regina Gray is a 30 y.o. [phone] female here for a routine annual gynecologic exam.  Current complaints: none.   Denies abnormal vaginal bleeding, discharge, pelvic pain, problems with intercourse or other gynecologic concerns.     Social Relationship: married  Living:spouse and children Work: stay at home mom Exercise: not much due to hip problem  Smoke/Alcohol/drug use: denies ues  Gynecologic History Patient's last menstrual period was 02/22/2021 (approximate). Contraception:  natural family planing  Last Pap: 06/13/19. Results were: normal with negative HPV Last mammogram: n/a.    The pregnancy intention screening data noted above was reviewed. Potential methods of contraception were discussed. The patient elected to proceed with NFP.  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 (Patient not taking: Reported on 03/22/2021) 30 tablet 0   famotidine (PEPCID) 20 MG tablet Take 1 tablet (20 mg total) by mouth daily. (Patient not taking: Reported on  03/22/2021) 30 tablet 2   LORazepam (ATIVAN) 1 MG tablet Take 1 tablet (1 mg total) by mouth at bedtime as needed for anxiety. (Patient not taking: Reported on 03/22/2021) 8 tablet 0   Magnesium 500 MG CAPS Take by mouth. (Patient not taking: Reported on 03/22/2021)     ondansetron (ZOFRAN ODT) 8 MG disintegrating tablet Take 1 tablet (8 mg total) by mouth every 8 (eight) hours as needed for nausea or vomiting. (Patient not taking: Reported on 03/22/2021) 84 tablet 1   sertraline (ZOLOFT) 100 MG tablet TAKE 2 TABLETS(200 MG) BY MOUTH DAILY (Patient not taking: Reported on 03/22/2021) 60 tablet 5   No current facility-administered medications on file prior to visit.    Allergies  Allergen Reactions   Codeine    Hydromorphone     Other reaction(s): Vomiting   Oxycodone     Other reaction(s): Vomiting   Sulfa Antibiotics     Social History:  reports that she has never smoked. She has never used smokeless tobacco. She reports that she does not currently use alcohol. She reports that she does not use drugs.  Family History  Problem Relation Age of Onset   Heart attack Maternal Grandfather    Healthy Mother    Healthy Father    Breast cancer Neg Hx    Ovarian cancer Neg Hx  Colon cancer Neg Hx    Diabetes Neg Hx     The following portions of the patient's history were reviewed and updated as appropriate: allergies, current medications, past family history, past medical history, past social history, past surgical history and problem list.  Review of Systems Pertinent items noted in HPI and remainder of comprehensive ROS otherwise negative.  Physical Exam:  BP 127/84   Pulse 78   Ht 5\' 9"  (1.753 m)   Wt 188 lb 11.2 oz (85.6 kg)   LMP 02/22/2021 (Approximate)   Breastfeeding Yes   BMI 27.87 kg/m  CONSTITUTIONAL: Well-developed, well-nourished female in no acute distress.  HENT:  Normocephalic, atraumatic, External right and left ear normal. Oropharynx is clear and moist EYES:  Conjunctivae and EOM are normal. Pupils are equal, round, and reactive to light. No scleral icterus.  NECK: Normal range of motion, supple, no masses.  Normal thyroid.  SKIN: Skin is warm and dry. No rash noted. Not diaphoretic. No erythema. No pallor. MUSCULOSKELETAL: Normal range of motion. No tenderness.  No cyanosis, clubbing, or edema.  2+ distal pulses. NEUROLOGIC: Alert and oriented to person, place, and time. Normal reflexes, muscle tone coordination.  PSYCHIATRIC: Normal mood and affect. Normal behavior. Normal judgment and thought content. CARDIOVASCULAR: Normal heart rate noted, regular rhythm RESPIRATORY: Clear to auscultation bilaterally. Effort and breath sounds normal, no problems with respiration noted. BREASTS: Symmetric in size. No masses, tenderness, skin changes, nipple drainage, or lymphadenopathy bilaterally. Lactating ABDOMEN: Soft, no distention noted.  No tenderness, rebound or guarding.  PELVIC: Normal appearing external genitalia and urethral meatus; normal appearing vaginal mucosa and cervix.  No abnormal discharge noted.  Pap smear not due.  Normal uterine size, no other palpable masses, no uterine or adnexal tenderness.  .   Assessment and Plan:    Annual Well Women GYN Exam  Pap: not due Mammogram : n/a  Labs: Declines Refills: none Referral: none  Routine preventative health maintenance measures emphasized. Please refer to After Visit Summary for other counseling recommendations.      13/04/2020, CNM [facility],  [facility]

## 2021-03-22 NOTE — Patient Instructions (Signed)

## 2021-03-24 ENCOUNTER — Ambulatory Visit: Payer: Commercial Managed Care - PPO | Attending: Certified Nurse Midwife | Admitting: Physical Therapy

## 2021-03-24 DIAGNOSIS — M542 Cervicalgia: Secondary | ICD-10-CM | POA: Insufficient documentation

## 2021-03-24 DIAGNOSIS — M25551 Pain in right hip: Secondary | ICD-10-CM | POA: Insufficient documentation

## 2021-03-24 DIAGNOSIS — M25512 Pain in left shoulder: Secondary | ICD-10-CM | POA: Insufficient documentation

## 2021-03-24 DIAGNOSIS — M62830 Muscle spasm of back: Secondary | ICD-10-CM | POA: Insufficient documentation

## 2021-03-24 DIAGNOSIS — M533 Sacrococcygeal disorders, not elsewhere classified: Secondary | ICD-10-CM | POA: Insufficient documentation

## 2021-03-24 DIAGNOSIS — G8929 Other chronic pain: Secondary | ICD-10-CM | POA: Insufficient documentation

## 2021-03-29 NOTE — Progress Notes (Signed)
[name] [address] [place] [address] Phone: [phone] Subjective:   Regina Gray, am serving as a scribe for Dr. Antoine Primas. This visit occurred during the SARS-CoV-2 public health emergency.  Safety protocols were in place, including screening questions prior to the visit, additional usage of staff PPE, and extensive cleaning of exam room while observing appropriate contact time as indicated for disinfecting solutions.   I'm seeing this patient by the request  of:  Erasmo Downer, MD  CC: Knee pain, back pain follow-up  WLN:[id number]  02/16/2021 Patient does have some mild degenerative changes.  Does have a S1 nerve root impingement.  Patient declined epidural and would like to continue with conservative therapy.  We will continue to monitor and follow-up again in 6 weeks.  Popliteal tendinitis.  Recommended knee compression, home exercises and work with aquatic training.  Discussed icing regimen.  Follow-up with me again in 6 weeks.  Worsening pain may need physical therapy.   Update 03/30/2021 Regina Gray is a 30 y.o. female coming in with complaint of R knee and LBP. Patient states knee still painful. Pain produced when flexed for a while and then extended. Pops a lot. Lateral side feels like something is sliding. LBP also still present, but hasn't worked out.  Xray R knee 02/16/2021 IMPRESSION: Borderline patella Oda Kilts.   Otherwise unremarkable exam.     Past Medical History:  Diagnosis Date   Complication of anesthesia    woke up during surgery   Past Surgical History:  Procedure Laterality Date   ANTERIOR CRUCIATE LIGAMENT REPAIR     APPENDECTOMY     WISDOM TOOTH EXTRACTION     Social History   Socioeconomic History   Marital status: Married    Spouse name: Not on file   Number of children: Not on file   Years of education: Not on file   Highest education level: Not on file  Occupational History    Not on file  Tobacco Use   Smoking status: Never   Smokeless tobacco: Never  Vaping Use   Vaping Use: Never used  Substance and Sexual Activity   Alcohol use: Not Currently   Drug use: No   Sexual activity: Yes    Birth control/protection: None  Other Topics Concern   Not on file  Social History Narrative   Not on file   Social Determinants of Health   Financial Resource Strain: Not on file  Food Insecurity: Not on file  Transportation Needs: Not on file  Physical Activity: Not on file  Stress: Not on file  Social Connections: Not on file   Allergies  Allergen Reactions   Codeine    Hydromorphone     Other reaction(s): Vomiting   Oxycodone     Other reaction(s): Vomiting   Sulfa Antibiotics    Family History  Problem Relation Age of Onset   Heart attack Maternal Grandfather    Healthy Mother    Healthy Father    Breast cancer Neg Hx    Ovarian cancer Neg Hx    Colon cancer Neg Hx    Diabetes Neg Hx        Current Outpatient Medications (Analgesics):    acetaminophen (TYLENOL) 500 MG tablet, Take 2 tablets (1,000 mg total) by mouth every 6 (six) hours as needed for fever or headache. (Patient not taking: Reported on 03/22/2021)   Current Outpatient Medications (Other):    famotidine (PEPCID) 20 MG tablet,  Take 1 tablet (20 mg total) by mouth daily. (Patient not taking: Reported on 03/22/2021)   LORazepam (ATIVAN) 1 MG tablet, Take 1 tablet (1 mg total) by mouth at bedtime as needed for anxiety. (Patient not taking: Reported on 03/22/2021)   Magnesium 500 MG CAPS, Take by mouth. (Patient not taking: Reported on 03/22/2021)   ondansetron (ZOFRAN ODT) 8 MG disintegrating tablet, Take 1 tablet (8 mg total) by mouth every 8 (eight) hours as needed for nausea or vomiting. (Patient not taking: Reported on 03/22/2021)   sertraline (ZOLOFT) 100 MG tablet, TAKE 2 TABLETS(200 MG) BY MOUTH DAILY (Patient not taking: Reported on 03/22/2021)   Reviewed prior external  information including notes and imaging from  primary care provider As well as notes that were available from care everywhere and other healthcare systems.  Past medical history, social, surgical and family history all reviewed in electronic medical record.  No pertanent information unless stated regarding to the chief complaint.   Review of Systems:  No headache, visual changes, nausea, vomiting, diarrhea, constipation, dizziness, abdominal pain, skin rash, fevers, chills, night sweats, weight loss, swollen lymph nodes, body aches, joint swelling, chest pain, shortness of breath, mood changes. POSITIVE muscle aches  Objective  Blood pressure 118/76, pulse 80, height 5\' 9"  (1.753 m), weight 190 lb (86.2 kg), SpO2 96 %, currently breastfeeding.   General: No apparent distress alert and oriented x3 mood and affect normal, dressed appropriately.  HEENT: Pupils equal, extraocular movements intact  Respiratory: Patient's speak in full sentences and does not appear short of breath  Cardiovascular: No lower extremity edema, non tender, no erythema  Gait normal with good balance and coordination.  MSK:  Non tender with full range of motion and good stability and symmetric strength and tone of shoulders, elbows, wrist, hip, knee and ankles bilaterally.  Patient's right knee does have good range of motion.  Mild crepitus noted.  Mild lateral tracking of the patella.  Still some tenderness over the lateral joint line near the popliteal tendon.  No pain in the back of the knee.  Negative McMurray's.  Limited muscular skeletal ultrasound was performed and interpreted by , M  Popliteal area there is no significant hypoechoic changes.  Meniscus appear to be unremarkable.  No significant hypoechoic changes of the patellar area. Impression: Improvement of the popliteal tendon   Impression and Recommendations:     The above documentation has been reviewed and is accurate and complete Antoine Primas, DO

## 2021-03-30 ENCOUNTER — Ambulatory Visit (INDEPENDENT_AMBULATORY_CARE_PROVIDER_SITE_OTHER): Payer: Commercial Managed Care - PPO | Admitting: Family Medicine

## 2021-03-30 ENCOUNTER — Encounter: Payer: Self-pay | Admitting: Family Medicine

## 2021-03-30 ENCOUNTER — Other Ambulatory Visit: Payer: Self-pay

## 2021-03-30 ENCOUNTER — Ambulatory Visit: Payer: Self-pay

## 2021-03-30 VITALS — BP 118/76 | HR 80 | Ht 69.0 in | Wt 190.0 lb

## 2021-03-30 DIAGNOSIS — M5137 Other intervertebral disc degeneration, lumbosacral region: Secondary | ICD-10-CM

## 2021-03-30 DIAGNOSIS — M79609 Pain in unspecified limb: Secondary | ICD-10-CM | POA: Diagnosis not present

## 2021-03-30 NOTE — Assessment & Plan Note (Signed)
Known arthritic changes with a nerve root impingement.  Patient still wants to hold on any injection.  He does think that it is getting better with her strength in the gluteal area.  If worsening pain we will consider the possibility of the injections or restarting osteopathic manipulation.  Patient will follow up 6 to 8 weeks

## 2021-03-30 NOTE — Patient Instructions (Addendum)
Work on hamstring strength Have appointment in 2 months  Increase activity as tolerated

## 2021-03-31 ENCOUNTER — Ambulatory Visit: Payer: Commercial Managed Care - PPO | Admitting: Physical Therapy

## 2021-03-31 DIAGNOSIS — M25551 Pain in right hip: Secondary | ICD-10-CM | POA: Diagnosis present

## 2021-03-31 DIAGNOSIS — M62830 Muscle spasm of back: Secondary | ICD-10-CM | POA: Diagnosis present

## 2021-03-31 DIAGNOSIS — M542 Cervicalgia: Secondary | ICD-10-CM | POA: Diagnosis present

## 2021-03-31 DIAGNOSIS — M533 Sacrococcygeal disorders, not elsewhere classified: Secondary | ICD-10-CM | POA: Diagnosis present

## 2021-03-31 DIAGNOSIS — M25512 Pain in left shoulder: Secondary | ICD-10-CM | POA: Diagnosis present

## 2021-03-31 DIAGNOSIS — G8929 Other chronic pain: Secondary | ICD-10-CM | POA: Diagnosis present
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
## 2021-03-31 NOTE — Patient Instructions (Signed)
HIIT on trampoline:  2 min each with 2 min stretches in between  1) facing door, band on top of  door with straight elbows, ( lats strengthening)   2) facing away from door, band at door knob with elbows bent, by your side, lowering wrists for triceps stengthening)   3) band loop at door knob, at below iliac crest, slight lean forward  __  Rest with legs propped up  __  Log rolling into bed to minimize back pain

## 2021-04-06 ENCOUNTER — Other Ambulatory Visit: Payer: Self-pay

## 2021-04-06 ENCOUNTER — Ambulatory Visit: Payer: Commercial Managed Care - PPO | Admitting: Physical Therapy

## 2021-04-06 DIAGNOSIS — M542 Cervicalgia: Secondary | ICD-10-CM

## 2021-04-06 DIAGNOSIS — G8929 Other chronic pain: Secondary | ICD-10-CM

## 2021-04-06 DIAGNOSIS — M25512 Pain in left shoulder: Secondary | ICD-10-CM

## 2021-04-06 DIAGNOSIS — M25551 Pain in right hip: Secondary | ICD-10-CM

## 2021-04-06 DIAGNOSIS — M533 Sacrococcygeal disorders, not elsewhere classified: Secondary | ICD-10-CM | POA: Diagnosis not present

## 2021-04-06 DIAGNOSIS — M62830 Muscle spasm of back: Secondary | ICD-10-CM
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
## 2021-04-06 NOTE — Patient Instructions (Signed)
Standing PNF band exercises  Stand 45 deg to doorway, front leg is opp of hand holding band   MAKE SURE YOUR BACK FOOT IS HIP WIDTH and both knees at 2-3 rd toe line    "Pulling seat belt"   Band over door knob with knot on the other side of the door Stand with toes, knees, and pelvis turned 45 deg away from the door,  Exhale to pull band from R ear height across body to the L pocket without turning pelvis and knees  Follow hand with eyes/head  10 x 2 reps   __ BANDS  Minisquat lats down down facing away from down   MAKE SURE NO RIB FLARE  BALLMOUNDS DOWN to track your knees  Shoulders down   ___   Modified Birddog  Table top position,  6 points of contact: paw hands, knees hip width apart, toes tucked under  Shoulders down and back like squeezing armpits  Not sagging back   L arm up , thumbs up, arm is out like a half"V" shoulder blade slides down and back  R knee straight, toes tucked on the ground,  Lengthen whole spine as if yard stick is balanced on spine, chin tucked   L + R = 1 rep 10 reps   X 2  ___  TO STRETCH HIP  After breastfeeding:  Half foot circle back to stretch the hip     -mermaid leg push ups ( elbow by ribs)  10 reps

## 2021-04-07 ENCOUNTER — Encounter: Payer: Commercial Managed Care - PPO | Admitting: Physical Therapy

## 2021-04-13 ENCOUNTER — Other Ambulatory Visit: Payer: Self-pay

## 2021-04-13 ENCOUNTER — Ambulatory Visit: Payer: Commercial Managed Care - PPO | Admitting: Physical Therapy

## 2021-04-13 DIAGNOSIS — M25551 Pain in right hip: Secondary | ICD-10-CM

## 2021-04-13 DIAGNOSIS — M25512 Pain in left shoulder: Secondary | ICD-10-CM

## 2021-04-13 DIAGNOSIS — M62830 Muscle spasm of back: Secondary | ICD-10-CM

## 2021-04-13 DIAGNOSIS — M542 Cervicalgia: Secondary | ICD-10-CM

## 2021-04-13 DIAGNOSIS — M533 Sacrococcygeal disorders, not elsewhere classified: Secondary | ICD-10-CM

## 2021-04-14 ENCOUNTER — Ambulatory Visit: Payer: Commercial Managed Care - PPO

## 2021-04-14 ENCOUNTER — Encounter: Payer: Commercial Managed Care - PPO | Admitting: Physical Therapy

## 2021-04-14 DIAGNOSIS — M533 Sacrococcygeal disorders, not elsewhere classified: Secondary | ICD-10-CM

## 2021-04-14 DIAGNOSIS — M25551 Pain in right hip: Secondary | ICD-10-CM
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
## 2021-04-14 NOTE — Patient Instructions (Addendum)
° °  °  Letter T, seeasaw on one leg with band band under L foot, wrap by big toe then, outer knee/ thigh, L hand pulling ( elbow by side)  Plant [place], toes spread, thigh out against band,, tracking knee in line with 2-3rd toe line Dipping forward, R foot lifts slight ( whole body like a see saw) off floor or  Press back foot against wall 20 reps  Both       __  Every other day  Treadmill  5 min warm up 2.9 mph  30min5.0 mph max speed    66min2.9 mph  Wider feet, soft landing, higher knees
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
## 2021-04-21 ENCOUNTER — Other Ambulatory Visit: Payer: Self-pay

## 2021-04-21 ENCOUNTER — Ambulatory Visit: Payer: Commercial Managed Care - PPO | Admitting: Physical Therapy

## 2021-04-21 DIAGNOSIS — M62830 Muscle spasm of back: Secondary | ICD-10-CM

## 2021-04-21 DIAGNOSIS — M542 Cervicalgia: Secondary | ICD-10-CM

## 2021-04-21 DIAGNOSIS — M533 Sacrococcygeal disorders, not elsewhere classified: Secondary | ICD-10-CM

## 2021-04-21 DIAGNOSIS — M25512 Pain in left shoulder: Secondary | ICD-10-CM

## 2021-04-21 DIAGNOSIS — M25551 Pain in right hip: Secondary | ICD-10-CM
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
## 2021-04-21 NOTE — Patient Instructions (Addendum)
Ankle dorsi flexion  Ankle strengthening on L with band band wrapped around outer L side of foot ballmound of L foot pressing onto band , R foot is placed on top of band hip width apart, with the ballmound ,  R hand holds the band 15 reps swinging L pinky toe out to the L  X 2x day  __     WALKING WITH RESISTANCE BLUE Band at waist connected to doorknob Stepping forward normal length steps, planting mid and forefoot down, center of mass ( navel) leans forward slightly as if you were walking uphill 3-4 steps till band feels taut ( MAKE SURE THE DOOR IS LOCKED AND WON'T OPEN)   Stepping backwards, lower heel slowly ---Keep ballmounds down ( not edge of foot), carry trunk and hips back , leaning forward, front knee along 2-3 rd toe line    2 min         ______

## 2021-05-12 ENCOUNTER — Other Ambulatory Visit: Payer: Self-pay

## 2021-05-12 ENCOUNTER — Ambulatory Visit: Payer: Commercial Managed Care - PPO | Attending: Certified Nurse Midwife

## 2021-05-12 DIAGNOSIS — M533 Sacrococcygeal disorders, not elsewhere classified: Secondary | ICD-10-CM | POA: Diagnosis present

## 2021-05-12 DIAGNOSIS — M25551 Pain in right hip: Secondary | ICD-10-CM | POA: Diagnosis present

## 2021-05-12 DIAGNOSIS — M542 Cervicalgia: Secondary | ICD-10-CM | POA: Insufficient documentation
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
## 2021-05-31 NOTE — Progress Notes (Signed)
[name] [address] [place] [address] Phone: [phone] Subjective:   INadine Counts, am serving as a scribe for Dr. Antoine Primas. This visit occurred during the SARS-CoV-2 public health emergency.  Safety protocols were in place, including screening questions prior to the visit, additional usage of staff PPE, and extensive cleaning of exam room while observing appropriate contact time as indicated for disinfecting solutions.   I'm seeing this patient by the request  of:  Erasmo Downer, MD  CC: Low back pain  CWC:[id number]  03/30/2021 Known arthritic changes with a nerve root impingement.  Patient still wants to hold on any injection.  He does think that it is getting better with her strength in the gluteal area.  If worsening pain we will consider the possibility of the injections or restarting osteopathic manipulation.  Patient will follow up 6 to 8 weeks  Update 06/01/2021 Regina Gray is a 31 y.o. female coming in with complaint of R hip and LBP. Patient has been doing PT. Patient states overall shes doing better. Has changed her diet and doing minimal exercising. Knee is great other than when she kneeling for a long time. Hip is better. Getting dry needling at PT. Would like OMT.       Past Medical History:  Diagnosis Date   Complication of anesthesia    woke up during surgery   Past Surgical History:  Procedure Laterality Date   ANTERIOR CRUCIATE LIGAMENT REPAIR     APPENDECTOMY     WISDOM TOOTH EXTRACTION     Social History   Socioeconomic History   Marital status: Married    Spouse name: Not on file   Number of children: Not on file   Years of education: Not on file   Highest education level: Not on file  Occupational History   Not on file  Tobacco Use   Smoking status: Never   Smokeless tobacco: Never  Vaping Use   Vaping Use: Never used  Substance and Sexual Activity   Alcohol use: Not Currently    Drug use: No   Sexual activity: Yes    Birth control/protection: None  Other Topics Concern   Not on file  Social History Narrative   Not on file   Social Determinants of Health   Financial Resource Strain: Not on file  Food Insecurity: Not on file  Transportation Needs: Not on file  Physical Activity: Not on file  Stress: Not on file  Social Connections: Not on file   Allergies  Allergen Reactions   Codeine    Hydromorphone     Other reaction(s): Vomiting   Oxycodone     Other reaction(s): Vomiting   Sulfa Antibiotics    Family History  Problem Relation Age of Onset   Heart attack Maternal Grandfather    Healthy Mother    Healthy Father    Breast cancer Neg Hx    Ovarian cancer Neg Hx    Colon cancer Neg Hx    Diabetes Neg Hx        Current Outpatient Medications (Analgesics):    acetaminophen (TYLENOL) 500 MG tablet, Take 2 tablets (1,000 mg total) by mouth every 6 (six) hours as needed for fever or headache. (Patient not taking: Reported on 03/22/2021)   Current Outpatient Medications (Other):    famotidine (PEPCID) 20 MG tablet, Take 1 tablet (20 mg total) by mouth daily. (Patient not taking: Reported on 03/22/2021)   LORazepam (ATIVAN) 1  MG tablet, Take 1 tablet (1 mg total) by mouth at bedtime as needed for anxiety. (Patient not taking: Reported on 03/22/2021)   Magnesium 500 MG CAPS, Take by mouth. (Patient not taking: Reported on 03/22/2021)   ondansetron (ZOFRAN ODT) 8 MG disintegrating tablet, Take 1 tablet (8 mg total) by mouth every 8 (eight) hours as needed for nausea or vomiting. (Patient not taking: Reported on 03/22/2021)   sertraline (ZOLOFT) 100 MG tablet, TAKE 2 TABLETS(200 MG) BY MOUTH DAILY (Patient not taking: Reported on 03/22/2021)   Reviewed prior external information including notes and imaging from  primary care provider As well as notes that were available from care everywhere and other healthcare systems.  Past medical history,  social, surgical and family history all reviewed in electronic medical record.  No pertanent information unless stated regarding to the chief complaint.   Review of Systems:  No headache, visual changes, nausea, vomiting, diarrhea, constipation, dizziness, abdominal pain, skin rash, fevers, chills, night sweats, weight loss, swollen lymph nodes, body aches, joint swelling, chest pain, shortness of breath, mood changes. POSITIVE muscle aches  Objective  Pulse 81, height 5\' 9"  (1.753 m), weight 190 lb (86.2 kg), SpO2 97 %, currently breastfeeding.   General: No apparent distress alert and oriented x3 mood and affect normal, dressed appropriately.  HEENT: Pupils equal, extraocular movements intact  Respiratory: Patient's speak in full sentences and does not appear short of breath  Cardiovascular: No lower extremity edema, non tender, no erythema  Back milligram this is a loss lordosis.  Patient does have improvement though and core strength noted.  Still has some very mild weakness of hip abductors.  Osteopathic findings C2 flexed rotated and side bent right C4 flexed rotated and side bent left C6 flexed rotated and side bent left T3 extended rotated and side bent right inhaled third rib T9 extended rotated and side bent left L2 flexed rotated and side bent right L4 flexed rotated and side bent right  sacrum right on right     Impression and Recommendations:     The above documentation has been reviewed and is accurate and complete , DO

## 2021-06-01 ENCOUNTER — Other Ambulatory Visit: Payer: Self-pay

## 2021-06-01 ENCOUNTER — Ambulatory Visit (INDEPENDENT_AMBULATORY_CARE_PROVIDER_SITE_OTHER): Payer: Commercial Managed Care - PPO | Admitting: Family Medicine

## 2021-06-01 VITALS — HR 81 | Ht 69.0 in | Wt 190.0 lb

## 2021-06-01 DIAGNOSIS — M5137 Other intervertebral disc degeneration, lumbosacral region: Secondary | ICD-10-CM | POA: Diagnosis not present

## 2021-06-01 DIAGNOSIS — M9908 Segmental and somatic dysfunction of rib cage: Secondary | ICD-10-CM

## 2021-06-01 DIAGNOSIS — M9902 Segmental and somatic dysfunction of thoracic region: Secondary | ICD-10-CM

## 2021-06-01 DIAGNOSIS — M9904 Segmental and somatic dysfunction of sacral region: Secondary | ICD-10-CM

## 2021-06-01 DIAGNOSIS — M9903 Segmental and somatic dysfunction of lumbar region: Secondary | ICD-10-CM | POA: Diagnosis not present

## 2021-06-01 DIAGNOSIS — M9901 Segmental and somatic dysfunction of cervical region: Secondary | ICD-10-CM

## 2021-06-01 NOTE — Patient Instructions (Signed)
Good to see you! Keep stretching hip flexors Core strength getting better See you again in 6-8 weeks

## 2021-06-01 NOTE — Assessment & Plan Note (Signed)
Chronic discomfort.  DDD Discussed with patient the regimen and home exercise.  Discussed hip abductor strengthening.  Patient needs to continue to work on stretching of the hip flexors.  Patient being a young mother though is going to continue to have some discomfort and pain.  Follow-up with me again in 6 to 8 weeks we will

## 2021-06-02 ENCOUNTER — Ambulatory Visit: Payer: Commercial Managed Care - PPO | Attending: Certified Nurse Midwife

## 2021-06-02 DIAGNOSIS — M62830 Muscle spasm of back: Secondary | ICD-10-CM | POA: Diagnosis present

## 2021-06-02 DIAGNOSIS — M533 Sacrococcygeal disorders, not elsewhere classified: Secondary | ICD-10-CM | POA: Insufficient documentation

## 2021-06-02 DIAGNOSIS — M25551 Pain in right hip: Secondary | ICD-10-CM | POA: Diagnosis present
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
## 2021-06-16 ENCOUNTER — Ambulatory Visit: Payer: Commercial Managed Care - PPO

## 2021-06-16 ENCOUNTER — Other Ambulatory Visit: Payer: Self-pay

## 2021-06-16 DIAGNOSIS — M533 Sacrococcygeal disorders, not elsewhere classified: Secondary | ICD-10-CM | POA: Diagnosis not present

## 2021-06-16 DIAGNOSIS — M62830 Muscle spasm of back: Secondary | ICD-10-CM

## 2021-06-16 DIAGNOSIS — M25551 Pain in right hip: Secondary | ICD-10-CM
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
## 2021-06-23 ENCOUNTER — Other Ambulatory Visit: Payer: Self-pay

## 2021-06-23 ENCOUNTER — Ambulatory Visit: Payer: Commercial Managed Care - PPO | Attending: Certified Nurse Midwife | Admitting: Physical Therapy

## 2021-06-23 DIAGNOSIS — G8929 Other chronic pain: Secondary | ICD-10-CM | POA: Insufficient documentation

## 2021-06-23 DIAGNOSIS — M542 Cervicalgia: Secondary | ICD-10-CM | POA: Diagnosis present

## 2021-06-23 DIAGNOSIS — M545 Low back pain, unspecified: Secondary | ICD-10-CM | POA: Insufficient documentation

## 2021-06-23 DIAGNOSIS — M62830 Muscle spasm of back: Secondary | ICD-10-CM | POA: Insufficient documentation

## 2021-06-23 DIAGNOSIS — M533 Sacrococcygeal disorders, not elsewhere classified: Secondary | ICD-10-CM | POA: Insufficient documentation

## 2021-06-23 DIAGNOSIS — M25551 Pain in right hip: Secondary | ICD-10-CM | POA: Insufficient documentation

## 2021-06-23 DIAGNOSIS — M25512 Pain in left shoulder: Secondary | ICD-10-CM | POA: Diagnosis present
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
## 2021-06-23 NOTE — Patient Instructions (Signed)
? ?  _________ ? ?Feet slides : ?  ?Points of contact at sitting bones  ?Four points of contact of foot,  ?Side knee back while keeping knee out along 2-3rd toe line  ? ?Heel up, ankle not twist out ?Lower heel while keeping knee out along 2-3rd toe line ?Four points of contact of foot, ?Slide foot back while keeping knee out along 2-3rd toe line ?  ?Repeated with other foot  ?  ?_________ ? ?Feet care :  Self -feet massage  ? ?Handshake : fingers between toes, moving ballmounds/toes back and forth several times while other hand anchors at arch. Do the same at the hind/mid foot.  ?Heel to toes upward to a letter Big Letter T strokes to spread ballmounds and toes, several times, pinch between webs of toes  ?Run finger tips along top of foot between long bones "comb between the bones"  ? ? ?Wiggle toes and spread them out when relaxing  ? ?

## 2021-07-05 ENCOUNTER — Other Ambulatory Visit: Payer: Self-pay

## 2021-07-05 ENCOUNTER — Ambulatory Visit: Payer: Commercial Managed Care - PPO

## 2021-07-05 DIAGNOSIS — M542 Cervicalgia: Secondary | ICD-10-CM

## 2021-07-05 DIAGNOSIS — M62830 Muscle spasm of back: Secondary | ICD-10-CM

## 2021-07-05 DIAGNOSIS — M533 Sacrococcygeal disorders, not elsewhere classified: Secondary | ICD-10-CM

## 2021-07-05 DIAGNOSIS — M25551 Pain in right hip: Secondary | ICD-10-CM

## 2021-07-06 NOTE — Therapy (Signed)
[place] [facility] ?[place], [place], [address] ?Phone: [phone]   Fax:  [phone] ? ?Physical Therapy Treatment ? ?Patient Details  ?Name: Regina Gray ?MRN: [id number] ?Date of Birth: Sep 24, 1990 ?Referring Provider (PT): Dani Gobble ? ? ?Encounter Date: 07/05/2021 ? ? PT End of Session - 07/06/21 0727   ? ? Visit Number 22   ? Date for PT Re-Evaluation 25/05/39   Recert 76/73/41, 93/79/02  ? PT Start Time 4097   ? PT Stop Time 3532   ? PT Time Calculation (min) 47 min   ? Activity Tolerance Patient tolerated treatment well;No increased pain   ? Behavior During Therapy [facility] for tasks assessed/performed   ? ?  ?  ? ?  ? ? ?Past Medical History:  ?Diagnosis Date  ? Complication of anesthesia   ? woke up during surgery  ? ? ?Past Surgical History:  ?Procedure Laterality Date  ? ANTERIOR CRUCIATE LIGAMENT REPAIR    ? APPENDECTOMY    ? WISDOM TOOTH EXTRACTION    ? ? ?There were no vitals filed for this visit. ? ? Subjective Assessment - 07/06/21 0725   ? ? Subjective Patient is having increased pain radiating from sacrum to anterior aspect of leg. Patient is having difficulty with all movements.   ? 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 Pt wants to get back to [employer], running, swimming, walk.Denied falls onto tailbone, injuries, pelvic floor issues. Pt had L ACL repaired. MRI was taken 01/25/21  on her R hip with results of "No MR findings of the pelvis explain R groin pain.  Probable annular tear and disc protusion L5-S1. S1 nerve root is getting pinched."   ? Patient Stated Goals know what is going on and how to stretch and what kind of exercises to do   ? Currently in Pain? Yes   ? Pain Score 5    ? Pain Location Leg   ? Pain Orientation Right   ? Pain Descriptors / Indicators Aching;Shooting   ? ?  ?  ? ?   ? ? ? ? ? ? ? ? ? ? ?Treatment: ?  ?  ?Palpation with intermittent STM to assess musculature: along R gastroc, quad, hip musculature, R low back, IT band  ?  ?Hip flexion and extension with trigger point 15x; educated on home program.  ? ?Grade II-III mobilizations to lumbar CPA with trunk extensions L2-3, L3-4, L4-5, L5-S1; centralized pain and reduced pain levels x 15 minutes ? ?Use of sacrum belt to reduce pain with movement-patient reports decreased discomfort with movement.  ? ?Standing trunk extension 10x (added to HEP) ?  ?  ?Trigger Point Dry Needling (TDN), unbilled ?Education performed with patient regarding potential benefit of TDN. Reviewed precautions and risks with patient. Reviewed special precautions/risks over lung fields which include pneumothorax. Reviewed signs and symptoms of pneumothorax and advised pt to go to ER immediately if these symptoms develop advise them of dry needling treatment. Extensive time spent with pt to ensure full understanding of TDN risks. Pt provided verbal consent to treatment. TDN performed to  with 0.3 x 60 single needle placements with local twitch response (LTR). Pistoning technique utilized. Improved pain-free motion following intervention. Musculature targeted; R hip flexors, R quad,  R IT band, R hamstring. X 12 minutes  ?  ?Pt  educated throughout session about proper posture and technique with exercises. Improved exercise technique, movement at target joints, use of target muscles after min to mod verbal, visual, tactile cues ? ? ?Patient has significant pain upon initial presentation. Utilization of CPA's to lumbar spine with mobilization with movement of trunk extensions allowed for centralization of pain to spine and reduction of symptoms. Patient shows signs indicating possible disc bulge. She will require follow up next week to reduce pain. Patient will continue to benefit from skilled physical therapy to reduce pain, improve alignment, and return to  PLOF ? ? ? ? ? ? ? ? ? ? ? ? ? ? ? PT Education - 07/06/21 0726   ? ? Education Details trunk extensions, TDN   ? Person(s) Educated Patient   ? Methods Explanation;Demonstration;Tactile cues;Verbal cues   ? Comprehension Verbalized understanding;Returned demonstration;Verbal cues required;Tactile cues required   ? ?  ?  ? ?  ? ? ? ? ? ? PT Long Term Goals - 06/23/21 1429   ? ?  ? PT LONG TERM GOAL #1  ? Title Pt demo levelled pelvic girlde and shoulder across 2 visits in order to progress to deep core HEP for optimal spinal/ pelvic stability for ADLs   ? Baseline R shoulder and iliac crest lower   ? Time 2   ? Period Weeks   ? Status Achieved   ?  ? PT LONG TERM GOAL #2  ? Title Pt will demo no R convex curve at thoracic spine, decreased mm tightenss at posterior thoracic area, and report her back rib has stayed in place across 1 month in order to to return to fitness with les risk for injuries   ? Time 10   ? Period Weeks   ? Status Achieved   ?  ? PT LONG TERM GOAL #3  ? Title Pt will demo IND with proper body mechanics with baby handling, t/f, and fitness routine to minimize relapse of Sx   ? Time 8   ? Period Weeks   ? Status Achieved   ?  ? PT LONG TERM GOAL #4  ? Title Pt will increase her FOTO hip score from 49 pts to > 60 pts in order to return ADLs and fitness ( 10/20: 54pts, 04/21/21: 85   pts)   ? Time 10   ? Period Weeks   ? Status Achieved   ?  ? PT LONG TERM GOAL #5  ? Title Pt will report no neck / shoulder pain and be able to sleep on her L side and on her back  through the night   ? Time 6   ? Period Weeks   ? Status Achieved   ?  ? Additional Long Term Goals  ? Additional Long Term Goals Yes   ?  ? PT LONG TERM GOAL #6  ? Title Pt will demo no rounded shoulders across 1 week in order to minimize neck pain and to continue lifting her babies   ? Time 8   ? Period Weeks   ? Status Achieved   ?  ? PT LONG TERM GOAL #7  ? Title Pt will demo proper lengthening of pelvic floor and coordination for pelvic  stability in order to optimize pelvic girdle stabilty   ? Time 8   ? Period Weeks   ? Status Achieved   ?  ? PT LONG TERM GOAL #8  ? Title Pt will demo improved flexibility in hamstrings, quads,  IT band and IND with yoga sequence in order to progress to fitness   ? Time 10   ? Period Weeks   ? Status Achieved   ?  ? PT LONG TERM GOAL  #9  ? TITLE Pt will demo decreased tightness at peroneal longus/ brevis R and gait improvements with more DF/knee flexion/ hip flexion/ push off and longer stride in order to minimize return of IT band tightness/ knee pain when running at faster spead on treadmill.   ? Baseline 2/23: hasn't began running yet  ( 06/23/21:  returned to running 1 mile 1-2 x a week)   ? Time 12   ? Period Weeks   ? Status Partially Met   ? Target Date 07/14/21   ?  ? PT LONG TERM GOAL  #10  ? TITLE Pt will demo increased mobility at R midfoot joints and less adducted knees in gait and in sitting testing position to minimize ITband tightness with running and be more IND   ? Baseline hypomobile midfoot joints R , adducted R knee   ? Time 10   ? Period Weeks   ? Status New   ? Target Date 09/01/21   ? ?  ?  ? ?  ? ? ? ? ? ? ? ? Plan - 07/06/21 0734   ? ? Clinical Impression Statement Patient has significant pain upon initial presentation. Utilization of CPA's to lumbar spine with mobilization with movement of trunk extensions allowed for centralization of pain to spine and reduction of symptoms. Patient shows signs indicating possible disc bulge. She will require follow up next week to reduce pain. Patient will continue to benefit from skilled physical therapy to reduce pain, improve alignment, and return to PLOF   ? Examination-Activity Limitations Sleep   ? Stability/Clinical Decision Making Evolving/Moderate complexity   ? Rehab Potential Good   ? PT Frequency 1x / week   ? PT Duration Other (comment)   10  ? PT Treatment/Interventions Functional mobility training;Therapeutic activities;Therapeutic  exercise;Patient/family education;Neuromuscular re-education;Stair training;Moist Heat;Cryotherapy;Balance training;Manual techniques;Energy conservation;Scar mobilization;Dry needling;Taping;Joint Manipulations;Splinting;Manual lymph

## 2021-07-11 ENCOUNTER — Ambulatory Visit: Payer: Commercial Managed Care - PPO

## 2021-07-11 ENCOUNTER — Other Ambulatory Visit: Payer: Self-pay

## 2021-07-11 DIAGNOSIS — M533 Sacrococcygeal disorders, not elsewhere classified: Secondary | ICD-10-CM

## 2021-07-11 DIAGNOSIS — M25551 Pain in right hip: Secondary | ICD-10-CM

## 2021-07-11 DIAGNOSIS — G8929 Other chronic pain: Secondary | ICD-10-CM

## 2021-07-11 DIAGNOSIS — M62830 Muscle spasm of back: Secondary | ICD-10-CM

## 2021-07-12 NOTE — Therapy (Signed)
[place] ?[facility] ?[address] ?[place], [place], [address] ?Phone: [phone]   Fax:  [phone] ? ?Physical Therapy Treatment/RECERT ? ?Patient Details  ?Name: Regina DineMary Katherine Gray ?MRN: [id number] ?Date of Birth: Sep 09, 1990 ?Referring Provider (PT): Serafina RoyalsMichelle Lawhorn ? ? ?Encounter Date: 07/11/2021 ? ? PT End of Session - 07/12/21 [id number]   ? ? Visit Number 23   ? Number of Visits 35   ? Date for PT Re-Evaluation 10/04/21   Recert 02/10/21, 04/21/21  ? PT Start Time 1645   ? PT Stop Time 1731   ? PT Time Calculation (min) 46 min   ? Activity Tolerance Patient tolerated treatment well   ? [facility] tasks assessed/performed   ? ?  ?  ? ?  ? ? ?Past Medical History:  ?Diagnosis Date  ? Complication of anesthesia   ? woke up during surgery  ? ? ?Past Surgical History:  ?Procedure Laterality Date  ? ANTERIOR CRUCIATE LIGAMENT REPAIR    ? APPENDECTOMY    ? WISDOM TOOTH EXTRACTION    ? ? ?There were no vitals filed for this visit. ? ? Subjective Assessment - 07/12/21 0721   ? ? Subjective Patient reports her pain has started to centralize since last session but continues to be present in back.   ? 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 Pt wants to get back to [employer], running, swimming, walk.Denied falls onto tailbone, injuries, pelvic floor issues. Pt had L ACL repaired. MRI was taken 01/25/21  on her R hip with results of "No MR findings of the pelvis explain R groin pain.  Probable annular tear and disc protusion L5-S1. S1 nerve root is getting pinched."   ? Limitations Sitting;Standing;Lifting;Walking;House hold activities   ? Patient Stated Goals know what is going on and how to stretch and what kind of exercises to do   ? Currently in Pain? Yes   ? Pain Score 5    ? Pain Location Back   ? Pain Orientation Lower   ? Pain Descriptors /  Indicators Aching   ? Pain Type Acute pain   ? Pain Onset 1 to 4 weeks ago   ? Pain Frequency Constant   ? ?  ?  ? ?  ? ? ? ? ? ? ? ? ? ?Goals: ?Pain with running: limited by new onset of back pain  ?Mobility of R midfoot joints and less adducted knees: improved but not fully able to test due to new back injury  ? ?New goal: ?MODI: 28% ?  ?Treatment: ?  ?  ?Palpation with intermittent STM to assess musculature: along R gastroc, quad, hip musculature, R low back, IT band  ?  ?Distraction supine position:  ?-SAD 4x30 seconds ?-figure 4 inferior direction 4x30 seconds ?  ?Grade II-III mobilizations to lumbar CPA with trunk extensions L2-3, L3-4, L4-5, L5-S1; centralized pain and reduced pain levels x 15 minutes ?  ?  ?Trigger Point Dry Needling (TDN), unbilled ?Education performed with patient regarding potential benefit of TDN. Reviewed precautions and risks with patient. Reviewed special precautions/risks over lung fields which include pneumothorax. Reviewed signs and symptoms of pneumothorax and advised pt to go to ER immediately if these symptoms develop advise them of dry needling treatment. Extensive time spent with pt to ensure full understanding of TDN risks.  Pt provided verbal consent to treatment. TDN performed to  with 0.3 x 60 single needle placements with local twitch response (LTR). Pistoning technique utilized. Improved pain-free motion following intervention. Musculature targeted; R hip flexors, R quad,  R adductors R hamstring, R gluteus. X 12 minutes  ?  ?Pt educated throughout session about proper posture and technique with exercises. Improved exercise technique, movement at target joints, use of target muscles after min to mod verbal, visual, tactile cues ? ? ?Patient's pain is beginning to centralize indicating progression towards correct direction with interventions. Patient's goals addressed with new goal of MODI due to new onset/injury of back pain. Patient's symptoms consistent with indicators  for bulging disc and will benefit from focalized treatments. Extension based interventions encouraged for HEP with patient demonstrating and verbalizing understanding. Patient will continue to benefit from skilled physical therapy to reduce pain, improve alignment, and return to PLOF ? ? ? ? ? ? ? ? ? ? ? ? ? ? ? ? PT Education - 07/12/21 0722   ? ? Education Details goals, POC, TDN   ? Person(s) Educated Patient   ? Methods Explanation;Demonstration;Tactile cues;Verbal cues   ? Comprehension Verbalized understanding;Returned demonstration;Verbal cues required;Tactile cues required   ? ?  ?  ? ?  ? ? ? ? ? ? PT Long Term Goals - 07/12/21 0729   ? ?  ? PT LONG TERM GOAL #1  ? Title Pt demo levelled pelvic girlde and shoulder across 2 visits in order to progress to deep core HEP for optimal spinal/ pelvic stability for ADLs   ? Baseline R shoulder and iliac crest lower   ? Time 2   ? Period Weeks   ? Status Achieved   ?  ? PT LONG TERM GOAL #2  ? Title Pt will demo no R convex curve at thoracic spine, decreased mm tightenss at posterior thoracic area, and report her back rib has stayed in place across 1 month in order to to return to fitness with les risk for injuries   ? Time 10   ? Period Weeks   ? Status Achieved   ?  ? PT LONG TERM GOAL #3  ? Title Pt will demo IND with proper body mechanics with baby handling, t/f, and fitness routine to minimize relapse of Sx   ? Time 8   ? Period Weeks   ? Status Achieved   ?  ? PT LONG TERM GOAL #4  ? Title Pt will increase her FOTO hip score from 49 pts to > 60 pts in order to return ADLs and fitness ( 10/20: 11BJY, 04/21/21: 85   pts)   ? Time 10   ? Period Weeks   ? Status Achieved   ?  ? PT LONG TERM GOAL #5  ? Title Pt will report no neck / shoulder pain and be able to sleep on her L side and on her back  through the night   ? Time 6   ? Period Weeks   ? Status Achieved   ?  ? Additional Long Term Goals  ? Additional Long Term Goals Yes   ?  ? PT LONG TERM GOAL #6  ?  Title Pt will demo no rounded shoulders across 1 week in order to minimize neck pain and to continue lifting her babies   ? Time 8   ? Period Weeks   ? Status Achieved   ?  ? PT LONG TERM GOAL #7  ?  Title Pt will demo proper lengthening of pelvic floor and coordination for pelvic stability in order to optimize pelvic girdle stabilty   ? Time 8   ? Period Weeks   ? Status Achieved   ?  ? PT LONG TERM GOAL #8  ? Title Pt will demo improved flexibility in hamstrings, quads, IT band and IND with yoga sequence in order to progress to fitness   ? Time 10   ? Period Weeks   ? Status Achieved   ?  ? PT LONG TERM GOAL  #9  ? TITLE Pt will demo decreased tightness at peroneal longus/ brevis R and gait improvements with more DF/knee flexion/ hip flexion/ push off and longer stride in order to minimize return of IT band tightness/ knee pain when running at faster spead on treadmill.   ? Baseline 2/23: hasn't began running yet  ( 06/23/21:  returned to running 1 mile 1-2 x a week) 3/21: unable to run due to new onset of back pain   ? Time 12   ? Period Weeks   ? Status On-going   ? Target Date 10/04/21   ?  ? PT LONG TERM GOAL  #10  ? TITLE Pt will demo increased mobility at R midfoot joints and less adducted knees in gait and in sitting testing position to minimize ITband tightness with running and be more IND   ? Baseline hypomobile midfoot joints R , adducted R knee 3/21: improved mobility; unable to run yet due to new onset back pain   ? Time 12   ? Period Weeks   ? Status On-going   ? Target Date 10/04/21   ?  ? PT LONG TERM GOAL  #11  ? TITLE Patient will reduce modified Oswestry score to <20 as to demonstrate minimal disability with ADLs including improved sleeping tolerance, walking/sitting tolerance etc for better mobility with ADLs.   ? Baseline 3/21: 28%   ? Time 12   ? Period Weeks   ? Status New   ? Target Date 10/04/21   ? ?  ?  ? ?  ? ? ? ? ? ? ? ? Plan - 07/12/21 0732   ? ? Clinical Impression Statement Patient's  pain is beginning to centralize indicating progression towards correct direction with interventions. Patient's goals addressed with new goal of MODI due to new onset/injury of back pain. Patient's symptoms

## 2021-07-19 NOTE — Progress Notes (Signed)
?Terrilee Files D.O. ?[facility] ?[address] [place] [address] ?Phone: [phone] ?Subjective:   ?I, Nadine Counts, am serving as a Neurosurgeon for Dr. Antoine Primas. ?This visit occurred during the SARS-CoV-2 public health emergency.  Safety protocols were in place, including screening questions prior to the visit, additional usage of staff PPE, and extensive cleaning of exam room while observing appropriate contact time as indicated for disinfecting solutions.  ? ?I'm seeing this patient by the request  of:  Erasmo Downer, MD ? ?CC: Back and neck pain follow-up ? ?[id number]  ?Regina Gray is a 31 y.o. female coming in with complaint of back and neck pain. OMT 06/01/2021. Patient states same per usual.  Increased tightness noted in the hip noted.  Continues to have intermittent radicular symptoms down the right leg but no significant weakness. ? ?Medications patient has been prescribed: None ? ?Taking: ? ? ?  ? ? ? ? ?Reviewed prior external information including notes and imaging from previsou exam, outside providers and external EMR if available.  ? ?As well as notes that were available from care everywhere and other healthcare systems. ? ?Past medical history, social, surgical and family history all reviewed in electronic medical record.  No pertanent information unless stated regarding to the chief complaint.  ? ?Past Medical History:  ?Diagnosis Date  ? Complication of anesthesia   ? woke up during surgery  ?  ?Allergies  ?Allergen Reactions  ? Codeine   ? Hydromorphone   ?  Other reaction(s): Vomiting  ? Oxycodone   ?  Other reaction(s): Vomiting  ? Sulfa Antibiotics   ? ? ? ?Review of Systems: ? No headache, visual changes, nausea, vomiting, diarrhea, constipation, dizziness, abdominal pain, skin rash, fevers, chills, night sweats, weight loss, swollen lymph nodes, body aches, joint swelling, chest pain, shortness of breath, mood changes. POSITIVE muscle aches ? ?Objective   ?Blood pressure 126/90, pulse 83, height 5\' 9"  (1.753 m), weight 191 lb (86.6 kg), SpO2 98 %, currently breastfeeding. ?  ?General: No apparent distress alert and oriented x3 mood and affect normal, dressed appropriately.  ?HEENT: Pupils equal, extraocular movements intact  ?Respiratory: Patient's speak in full sentences and does not appear short of breath  ?Cardiovascular: No lower extremity edema, non tender, no erythema  ?Low back exam does have some loss of lordosis.  Some tenderness to palpation in the paraspinal musculature.  Patient does have tightness of the hip flexors right greater than left.  Patient was sore over the pelvic bone.  This seems to be more over the pubic symphysis and may even have some mild swelling noted.  Tenderness to palpation in the paraspinal musculature in the lumbar spine ? ?Osteopathic findings ? ?C2 flexed rotated and side bent right ?C7 flexed rotated and side bent left ?T3 extended rotated and side bent right inhaled rib ?T9 extended rotated and side bent left ?L2 flexed rotated and side bent right ?Sacrum right on right ? ? ? ? ?  ?Assessment and Plan: ? ?Degeneration of intervertebral disc at L5-S1 level ?Chronic, with mild exacerbation.  Patient also has significant tightness of the hip flexor that I think contributes to some of the groin pain.  Patient has been recently have a viral infection and I am concerned that patient also has a little bit of osteitis pubis noted today.  Patient given prednisone for short course.  Warned that with breast-feeding is safe but may decrease milk production.  Follow-up again in 6 to  8 weeks. ? ?Osteitis pubis (HCC) ?Discussed with patient in great length, and icing regimen and home exercises. ?Patient given a short course of prednisone with patient breast-feeding.  I hope that this will make improvement.  ? ?Nonallopathic problems ? ?Decision today to treat with OMT was based on Physical Exam ? ?After verbal consent patient was treated  with HVLA, ME, FPR techniques in cervical, rib, thoracic, lumbar, and sacral  areas ? ?Patient tolerated the procedure well with improvement in symptoms ? ?Patient given exercises, stretches and lifestyle modifications ? ?See medications in patient instructions if given ? ?Patient will follow up in 4-8 weeks ? ?  ? ?The above documentation has been reviewed and is accurate and complete Judi Saa, DO ? ? ? ?  ? ? Note: This dictation was prepared with Dragon dictation along with smaller phrase technology. Any transcriptional errors that result from this process are unintentional.    ?  ?  ? ?

## 2021-07-20 ENCOUNTER — Ambulatory Visit (INDEPENDENT_AMBULATORY_CARE_PROVIDER_SITE_OTHER): Payer: Commercial Managed Care - PPO | Admitting: Family Medicine

## 2021-07-20 VITALS — BP 126/90 | HR 83 | Ht 69.0 in | Wt 191.0 lb

## 2021-07-20 DIAGNOSIS — M869 Osteomyelitis, unspecified: Secondary | ICD-10-CM | POA: Insufficient documentation

## 2021-07-20 DIAGNOSIS — M9902 Segmental and somatic dysfunction of thoracic region: Secondary | ICD-10-CM

## 2021-07-20 DIAGNOSIS — M9903 Segmental and somatic dysfunction of lumbar region: Secondary | ICD-10-CM | POA: Diagnosis not present

## 2021-07-20 DIAGNOSIS — M898X8 Other specified disorders of bone, other site: Secondary | ICD-10-CM | POA: Insufficient documentation

## 2021-07-20 DIAGNOSIS — M9904 Segmental and somatic dysfunction of sacral region: Secondary | ICD-10-CM

## 2021-07-20 DIAGNOSIS — M5137 Other intervertebral disc degeneration, lumbosacral region: Secondary | ICD-10-CM | POA: Diagnosis not present

## 2021-07-20 DIAGNOSIS — M9901 Segmental and somatic dysfunction of cervical region: Secondary | ICD-10-CM

## 2021-07-20 DIAGNOSIS — M9908 Segmental and somatic dysfunction of rib cage: Secondary | ICD-10-CM

## 2021-07-20 MED ORDER — PREDNISONE 20 MG PO TABS: 20.0000 mg | ORAL_TABLET | Freq: Every day | ORAL | 0 refills | Status: DC

## 2021-07-20 NOTE — Assessment & Plan Note (Signed)
Chronic, with mild exacerbation.  Patient also has significant tightness of the hip flexor that I think contributes to some of the groin pain.  Patient has been recently have a viral infection and I am concerned that patient also has a little bit of osteitis pubis noted today.  Patient given prednisone for short course.  Warned that with breast-feeding is safe but may decrease milk production.  Follow-up again in 6 to 8 weeks. ?

## 2021-07-20 NOTE — Assessment & Plan Note (Signed)
Discussed with patient in great length, and icing regimen and home exercises. ?Patient given a short course of prednisone with patient breast-feeding.  I hope that this will make improvement. ?

## 2021-07-20 NOTE — Patient Instructions (Addendum)
Good to see you! ?Prescription filled ?Work on [employer] ?Wart remover cream daily to foot and hand then use a pumice stone ?See you again in 6 weeks ?

## 2021-07-26 ENCOUNTER — Ambulatory Visit: Payer: Commercial Managed Care - PPO | Attending: Certified Nurse Midwife

## 2021-07-26 DIAGNOSIS — M62830 Muscle spasm of back: Secondary | ICD-10-CM | POA: Diagnosis present

## 2021-07-26 DIAGNOSIS — M542 Cervicalgia: Secondary | ICD-10-CM | POA: Diagnosis present

## 2021-07-26 DIAGNOSIS — M545 Low back pain, unspecified: Secondary | ICD-10-CM | POA: Insufficient documentation

## 2021-07-26 DIAGNOSIS — M25551 Pain in right hip: Secondary | ICD-10-CM | POA: Insufficient documentation

## 2021-07-26 DIAGNOSIS — M25512 Pain in left shoulder: Secondary | ICD-10-CM | POA: Insufficient documentation

## 2021-07-26 DIAGNOSIS — M533 Sacrococcygeal disorders, not elsewhere classified: Secondary | ICD-10-CM | POA: Diagnosis present

## 2021-07-26 DIAGNOSIS — G8929 Other chronic pain: Secondary | ICD-10-CM | POA: Diagnosis present

## 2021-07-26 NOTE — Therapy (Signed)
[place] ?[facility] ?[address] ?[place], [place], [address] ?Phone: [phone]   Fax:  [phone] ? ?Physical Therapy Treatment ? ?Patient Details  ?Name: Regina Gray ?MRN: [id number] ?Date of Birth: 1991-04-03 ?Referring Provider (PT): Serafina Royals ? ? ?Encounter Date: 07/26/2021 ? ? PT End of Session - 07/26/21 0709   ? ? Visit Number 24   ? Number of Visits 35   ? Date for PT Re-Evaluation 10/04/21   Recert 02/10/21, 04/21/21  ? PT Start Time [id number]   ? PT Stop Time 0800   ? PT Time Calculation (min) 47 min   ? Activity Tolerance Patient tolerated treatment well   ? Behavior During Therapy [facility] for tasks assessed/performed   ? ?  ?  ? ?  ? ? ?Past Medical History:  ?Diagnosis Date  ? Complication of anesthesia   ? woke up during surgery  ? ? ?Past Surgical History:  ?Procedure Laterality Date  ? ANTERIOR CRUCIATE LIGAMENT REPAIR    ? APPENDECTOMY    ? WISDOM TOOTH EXTRACTION    ? ? ?There were no vitals filed for this visit. ? ? Subjective Assessment - 07/26/21 0809   ? ? Subjective Patient reports her pain is improving but still occasionally present. Has started doing kettlebell work outs.   ? 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 Pt wants to get back to [employer], running, swimming, walk.Denied falls onto tailbone, injuries, pelvic floor issues. Pt had L ACL repaired. MRI was taken 01/25/21  on her R hip with results of "No MR findings of the pelvis explain R groin pain.  Probable annular tear and disc protusion L5-S1. S1 nerve root is getting pinched."   ? Limitations Sitting;Standing;Lifting;Walking;House hold activities   ? Patient Stated Goals know what is going on and how to stretch and what kind of exercises to do   ? Currently in Pain? Yes   ? Pain Score 2    ? Pain Location Back   ? Pain Orientation Lower   ? Pain Descriptors /  Indicators Aching   ? Pain Type Acute pain   ? Pain Onset 1 to 4 weeks ago   ? Pain Frequency Constant   ? ?  ?  ? ?  ? ? ? ? ? ? ? ? ? ? ?Treatment: ?  ?  ?Palpation with intermittent STM to assess musculature: along R gastroc, quad, hip musculature, R low back, IT band  ?  ?  ?Grade II-III mobilizations to lumbar CPA with trunk extensions L2-3, L3-4, L4-5, L5-S1; centralized pain and reduced pain levels x 15 minutes ?  ?Hamstring lengthening stretch 45 seconds each LE ?Sciatic nerve glide with df/pf while leg on PT shoulder 20x ?Single knee to chest 30 seconds each LE ?Cross body knee to chest 30 seconds each LE ?Figure 4 stretch 3 seconds each LE  ?  ?Trigger Point Dry Needling (TDN), unbilled ?Education performed with patient regarding potential benefit of TDN. Reviewed precautions and risks with patient. Reviewed special precautions/risks over lung fields which include pneumothorax. Reviewed signs and symptoms of pneumothorax and advised pt to go to ER immediately if these symptoms develop advise them of dry needling treatment. Extensive time spent with pt to ensure full understanding of TDN risks. Pt provided verbal consent to treatment. TDN performed to  with 0.3  x 60, 0.3x 0.3 single needle placements with local twitch response (LTR). Pistoning technique utilized. Improved pain-free motion following intervention. Musculature targeted; R gluteus musculature, R obturatus internus and piriformis, R and L lumbar and thoracic paraspinals X 12 minutes  ?  ?Pt educated throughout session about proper posture and technique with exercises. Improved exercise technique, movement at target joints, use of target muscles after min to mod verbal, visual, tactile cues ? ? ? ?Patient is having improved symptoms with decreased pain in low back and radiating down leg. Discomfort and pain is still occasionally present indicating patient's issue is improving but not completely corrected at this time. Utilization of CPA's to  lumbar spine with mobilization with movement of trunk extensions allowed for centralization of pain to spine and reduction of symptoms. Patient shows signs indicating possible disc bulge. Patient will continue to benefit from skilled physical therapy to reduce pain, improve alignment, and return to PLOF ? ? ? ? ? ? ? ? ? ? ? ? ? ? PT Education - 07/26/21 0709   ? ? Education Details pain reduction TDN   ? Person(s) Educated Patient   ? Methods Explanation;Demonstration;Tactile cues;Verbal cues   ? Comprehension Verbalized understanding;Returned demonstration;Verbal cues required;Tactile cues required   ? ?  ?  ? ?  ? ? ? ? ? ? PT Long Term Goals - 07/12/21 0729   ? ?  ? PT LONG TERM GOAL #1  ? Title Pt demo levelled pelvic girlde and shoulder across 2 visits in order to progress to deep core HEP for optimal spinal/ pelvic stability for ADLs   ? Baseline R shoulder and iliac crest lower   ? Time 2   ? Period Weeks   ? Status Achieved   ?  ? PT LONG TERM GOAL #2  ? Title Pt will demo no R convex curve at thoracic spine, decreased mm tightenss at posterior thoracic area, and report her back rib has stayed in place across 1 month in order to to return to fitness with les risk for injuries   ? Time 10   ? Period Weeks   ? Status Achieved   ?  ? PT LONG TERM GOAL #3  ? Title Pt will demo IND with proper body mechanics with baby handling, t/f, and fitness routine to minimize relapse of Sx   ? Time 8   ? Period Weeks   ? Status Achieved   ?  ? PT LONG TERM GOAL #4  ? Title Pt will increase her FOTO hip score from 49 pts to > 60 pts in order to return ADLs and fitness ( 10/20: 70VXB, 04/21/21: 85   pts)   ? Time 10   ? Period Weeks   ? Status Achieved   ?  ? PT LONG TERM GOAL #5  ? Title Pt will report no neck / shoulder pain and be able to sleep on her L side and on her back  through the night   ? Time 6   ? Period Weeks   ? Status Achieved   ?  ? Additional Long Term Goals  ? Additional Long Term Goals Yes   ?  ? PT LONG  TERM GOAL #6  ? Title Pt will demo no rounded shoulders across 1 week in order to minimize neck pain and to continue lifting her babies   ? Time 8   ? Period Weeks   ? Status Achieved   ?  ? PT LONG TERM  GOAL #7  ? Title Pt will demo proper lengthening of pelvic floor and coordination for pelvic stability in order to optimize pelvic girdle stabilty   ? Time 8   ? Period Weeks   ? Status Achieved   ?  ? PT LONG TERM GOAL #8  ? Title Pt will demo improved flexibility in hamstrings, quads, IT band and IND with yoga sequence in order to progress to fitness   ? Time 10   ? Period Weeks   ? Status Achieved   ?  ? PT LONG TERM GOAL  #9  ? TITLE Pt will demo decreased tightness at peroneal longus/ brevis R and gait improvements with more DF/knee flexion/ hip flexion/ push off and longer stride in order to minimize return of IT band tightness/ knee pain when running at faster spead on treadmill.   ? Baseline 2/23: hasn't began running yet  ( 06/23/21:  returned to running 1 mile 1-2 x a week) 3/21: unable to run due to new onset of back pain   ? Time 12   ? Period Weeks   ? Status On-going   ? Target Date 10/04/21   ?  ? PT LONG TERM GOAL  #10  ? TITLE Pt will demo increased mobility at R midfoot joints and less adducted knees in gait and in sitting testing position to minimize ITband tightness with running and be more IND   ? Baseline hypomobile midfoot joints R , adducted R knee 3/21: improved mobility; unable to run yet due to new onset back pain   ? Time 12   ? Period Weeks   ? Status On-going   ? Target Date 10/04/21   ?  ? PT LONG TERM GOAL  #11  ? TITLE Patient will reduce modified Oswestry score to <20 as to demonstrate minimal disability with ADLs including improved sleeping tolerance, walking/sitting tolerance etc for better mobility with ADLs.   ? Baseline 3/21: 28%   ? Time 12   ? Period Weeks   ? Status New   ? Target Date 10/04/21   ? ?  ?  ? ?  ? ? ? ? ? ? ? ? Plan - 07/26/21 0810   ? ? Clinical Impression  Statement Patient is having improved symptoms with decreased pain in low back and radiating down leg. Discomfort and pain is still occasionally present indicating patient's issue is improving but not completely corr

## 2021-08-04 ENCOUNTER — Ambulatory Visit: Payer: Commercial Managed Care - PPO

## 2021-08-09 ENCOUNTER — Ambulatory Visit: Payer: Commercial Managed Care - PPO

## 2021-08-09 DIAGNOSIS — G8929 Other chronic pain: Secondary | ICD-10-CM

## 2021-08-09 DIAGNOSIS — M533 Sacrococcygeal disorders, not elsewhere classified: Secondary | ICD-10-CM

## 2021-08-09 DIAGNOSIS — M62830 Muscle spasm of back: Secondary | ICD-10-CM

## 2021-08-09 DIAGNOSIS — M25551 Pain in right hip: Secondary | ICD-10-CM

## 2021-08-09 NOTE — Therapy (Signed)
[place] ?[facility] ?[address] ?[place], [place], [address] ?Phone: [phone]   Fax:  [phone] ? ?Physical Therapy Treatment ? ?Patient Details  ?Name: Regina Gray ?MRN: [id number] ?Date of Birth: 1990-10-10 ?Referring Provider (PT): Serafina RoyalsMichelle Lawhorn ? ? ?Encounter Date: 08/09/2021 ? ? PT End of Session - 08/09/21 1255   ? ? Visit Number 25   ? Number of Visits 35   ? Date for PT Re-Evaluation 10/04/21   Recert 02/10/21, 04/21/21  ? PT Start Time 1259   ? PT Stop Time 1344   ? PT Time Calculation (min) 45 min   ? Activity Tolerance Patient tolerated treatment well   ? [facility] assessed/performed   ? ?  ?  ? ?  ? ? ?Past Medical History:  ?Diagnosis Date  ? Complication of anesthesia   ? woke up during surgery  ? ? ?Past Surgical History:  ?Procedure Laterality Date  ? ANTERIOR CRUCIATE LIGAMENT REPAIR    ? APPENDECTOMY    ? WISDOM TOOTH EXTRACTION    ? ? ?There were no vitals filed for this visit. ? ? Subjective Assessment - 08/09/21 1413   ? ? Subjective Patient has a flare up in her back after her trip to [place] and back. Having severe pain in her pelvic region only, only radiating mid glute.   ? 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 Pt wants to get back to [employer], running, swimming, walk.Denied falls onto tailbone, injuries, pelvic floor issues. Pt had L ACL repaired. MRI was taken 01/25/21  on her R hip with results of "No MR findings of the pelvis explain R groin pain.  Probable annular tear and disc protusion L5-S1. S1 nerve root is getting pinched."   ? Limitations Sitting;Standing;Lifting;Walking;House hold activities   ? Patient Stated Goals know what is going on and how to stretch and what kind of exercises to do   ? Currently in Pain? Yes   ? Pain Score 8    ? Pain Location Pelvis   ? Pain  Orientation Lower   ? Pain Descriptors / Indicators Aching   ? Pain Type Acute pain   ? Pain Onset 1 to 4 weeks ago   ? Pain Frequency Constant   ? ?  ?  ? ?  ? ? ? ? ? ? ?Treatment: ?  ?  ?Palpation with intermittent STM to assess musculature: along R gastroc, quad, hip musculature, R low back, IT band  ?  ?  ?Grade II-III mobilizations to lumbar CPA with trunk extensions L2-3, L3-4, L4-5, L5-S1; centralized pain and reduced pain levels x 15 minutes ?  ?Hamstring lengthening stretch 45 seconds each LE ?Sciatic nerve glide with df/pf while leg on PT shoulder 20x ?Single knee to chest 30 seconds each LE ?Cross body knee to chest 30 seconds each LE ?Figure 4 stretch 3 seconds each LE  ? ?Posterior pelvic tilt 10x (added to HEP) ?Posterior pelvic tilt with adduction ball squeeze 10x 3 second holds  ?  ?Trigger Point Dry Needling (TDN), unbilled ?Education performed with patient regarding potential benefit of TDN. Reviewed precautions and risks with patient. Reviewed special precautions/risks over lung fields which include pneumothorax. Reviewed signs and symptoms of pneumothorax and advised pt to go to ER immediately if these symptoms develop advise them of  dry needling treatment. Extensive time spent with pt to ensure full understanding of TDN risks. Pt provided verbal consent to treatment. TDN performed to  with 0.3 x 60, 0.3x 0.3 single needle placements with local twitch response (LTR). Pistoning technique utilized. Improved pain-free motion following intervention. Musculature targeted; R gluteus musculature, R obturatus internus and piriformis, R and L lumbar and thoracic x8 minutes.  ?  ?Pt educated throughout session about proper posture and technique with exercises. Improved exercise technique, movement at target joints, use of target muscles after min to mod verbal, visual, tactile cues ?  ?  ? ?Patient has regression of symptoms indicating flare up of disc and associated musculature guarding. Patient responds  well to mobilizations with prone press ups and TDN for muscle tissue tension reduction. She reports decreased pain by end of session. Utilization of posterior pelvic tilts for alignment introduced. Patient will continue to benefit from skilled physical therapy to reduce pain, improve alignment, and return to PLOF ? ? ? ? ? ? ? ? ? ? ? ? ? ? ? ? ? ? ? ? PT Education - 08/09/21 1254   ? ? Education Details exercise technique, body mechanics, pain reduction   ? Person(s) Educated Patient   ? Methods Explanation;Demonstration;Tactile cues;Verbal cues   ? Comprehension Verbalized understanding;Returned demonstration;Verbal cues required;Tactile cues required   ? ?  ?  ? ?  ? ? ? ? ? ? PT Long Term Goals - 07/12/21 0729   ? ?  ? PT LONG TERM GOAL #1  ? Title Pt demo levelled pelvic girlde and shoulder across 2 visits in order to progress to deep core HEP for optimal spinal/ pelvic stability for ADLs   ? Baseline R shoulder and iliac crest lower   ? Time 2   ? Period Weeks   ? Status Achieved   ?  ? PT LONG TERM GOAL #2  ? Title Pt will demo no R convex curve at thoracic spine, decreased mm tightenss at posterior thoracic area, and report her back rib has stayed in place across 1 month in order to to return to fitness with les risk for injuries   ? Time 10   ? Period Weeks   ? Status Achieved   ?  ? PT LONG TERM GOAL #3  ? Title Pt will demo IND with proper body mechanics with baby handling, t/f, and fitness routine to minimize relapse of Sx   ? Time 8   ? Period Weeks   ? Status Achieved   ?  ? PT LONG TERM GOAL #4  ? Title Pt will increase her FOTO hip score from 49 pts to > 60 pts in order to return ADLs and fitness ( 10/20: 02HEN, 04/21/21: 85   pts)   ? Time 10   ? Period Weeks   ? Status Achieved   ?  ? PT LONG TERM GOAL #5  ? Title Pt will report no neck / shoulder pain and be able to sleep on her L side and on her back  through the night   ? Time 6   ? Period Weeks   ? Status Achieved   ?  ? Additional Long Term  Goals  ? Additional Long Term Goals Yes   ?  ? PT LONG TERM GOAL #6  ? Title Pt will demo no rounded shoulders across 1 week in order to minimize neck pain and to continue lifting her babies   ? Time 8   ?  Period Weeks   ? Status Achieved   ?  ? PT LONG TERM GOAL #7  ? Title Pt will demo proper lengthening of pelvic floor and coordination for pelvic stability in order to optimize pelvic girdle stabilty   ? Time 8   ? Period Weeks   ? Status Achieved   ?  ? PT LONG TERM GOAL #8  ? Title Pt will demo improved flexibility in hamstrings, quads, IT band and IND with yoga sequence in order to progress to fitness   ? Time 10   ? Period Weeks   ? Status Achieved   ?  ? PT LONG TERM GOAL  #9  ? TITLE Pt will demo decreased tightness at peroneal longus/ brevis R and gait improvements with more DF/knee flexion/ hip flexion/ push off and longer stride in order to minimize return of IT band tightness/ knee pain when running at faster spead on treadmill.   ? Baseline 2/23: hasn't began running yet  ( 06/23/21:  returned to running 1 mile 1-2 x a week) 3/21: unable to run due to new onset of back pain   ? Time 12   ? Period Weeks   ? Status On-going   ? Target Date 10/04/21   ?  ? PT LONG TERM GOAL  #10  ? TITLE Pt will demo increased mobility at R midfoot joints and less adducted knees in gait and in sitting testing position to minimize ITband tightness with running and be more IND   ? Baseline hypomobile midfoot joints R , adducted R knee 3/21: improved mobility; unable to run yet due to new onset back pain   ? Time 12   ? Period Weeks   ? Status On-going   ? Target Date 10/04/21   ?  ? PT LONG TERM GOAL  #11  ? TITLE Patient will reduce modified Oswestry score to <20 as to demonstrate minimal disability with ADLs including improved sleeping tolerance, walking/sitting tolerance etc for better mobility with ADLs.   ? Baseline 3/21: 28%   ? Time 12   ? Period Weeks   ? Status New   ? Target Date 10/04/21   ? ?  ?  ? ?   ? ? ? ? ? ? ? ? Plan - 08/09/21 1703   ? ? Clinical Impression Statement Patient has regression of symptoms indicating flare up of disc and associated musculature guarding. Patient responds well to mobilizations with prone p

## 2021-08-12 ENCOUNTER — Encounter: Payer: Commercial Managed Care - PPO | Admitting: Physical Therapy

## 2021-08-18 ENCOUNTER — Ambulatory Visit: Payer: Commercial Managed Care - PPO | Admitting: Physical Therapy

## 2021-08-18 DIAGNOSIS — M25551 Pain in right hip: Secondary | ICD-10-CM | POA: Diagnosis not present

## 2021-08-18 DIAGNOSIS — M62830 Muscle spasm of back: Secondary | ICD-10-CM

## 2021-08-18 DIAGNOSIS — M533 Sacrococcygeal disorders, not elsewhere classified: Secondary | ICD-10-CM

## 2021-08-18 DIAGNOSIS — G8929 Other chronic pain: Secondary | ICD-10-CM

## 2021-08-18 DIAGNOSIS — M25512 Pain in left shoulder: Secondary | ICD-10-CM

## 2021-08-18 DIAGNOSIS — M542 Cervicalgia: Secondary | ICD-10-CM

## 2021-08-19 NOTE — Therapy (Signed)
?[facility] ?[place], [place], [address] ?Phone: [phone]   Fax:  [phone] ? ?Physical Therapy Treatment ? ?Patient Details  ?Name: Regina Gray ?MRN: [id number] ?Date of Birth: Apr 05, 1991 ?Referring Provider (PT): Dani Gobble ? ? ?Encounter Date: 08/18/2021 ? ? PT End of Session - 08/19/21 1231   ? ? Visit Number 26   ? Number of Visits 35   ? Date for PT Re-Evaluation [id number]   Recert [id number], [id number]  ? PT Start Time 1400   ? PT Stop Time 1500   ? PT Time Calculation (min) 60 min   ? Activity Tolerance Patient tolerated treatment well   ? Behavior During Therapy [facility] for tasks assessed/performed   ? ?  ?  ? ?  ? ? ?Past Medical History:  ?Diagnosis Date  ? Complication of anesthesia   ? woke up during surgery  ? ? ?Past Surgical History:  ?Procedure Laterality Date  ? ANTERIOR CRUCIATE LIGAMENT REPAIR    ? APPENDECTOMY    ? WISDOM TOOTH EXTRACTION    ? ? ?There were no vitals filed for this visit. ? ? Subjective Assessment - 08/18/21 1417   ? ? Subjective Pt has not had any radiating pain and pain now is localized at R SIJ. Prior to disc injury, pt's exercises included kettle bells 25 lb exercises and walking.   ? 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 Pt wants to get back to [employer], running, swimming, walk.Denied falls onto tailbone, injuries, pelvic floor issues. Pt had L ACL repaired. MRI was taken 01/25/21  on her R hip with results of "No MR findings of the pelvis explain R groin pain.  Probable annular tear and disc protusion L5-S1. S1 nerve root is getting pinched."   ? Limitations Sitting;Standing;Lifting;Walking;House hold activities   ? Patient Stated Goals know what is going on and how to stretch and what kind of exercises to do   ? Pain Onset 1 to 4 weeks ago   ? ?  ?  ? ?  ? ? ? ? ? [facility] PT  Assessment - 08/19/21 1230   ? ?  ? Coordination  ? Coordination and Movement Description poor propioception of pelvic tilts, overuse of gluts in posterior tilt , overuse of upper trap   ?  ? Lunges  ? Comments poor foot and knee stability with kettle bells   ?  ? Palpation  ? Palpation comment tightness / tenderness at pubic symphysis attachments R   ? ?  ?  ? ?  ? ? ? ? ? ? ? ? ? ? ? ? ? ? ? ? [facility] PT Treatment/Exercise - 08/19/21 1229   ? ?  ? Therapeutic Activites   ? Other Therapeutic Activities modified her kettle bell and core exericses   ?  ? Neuro Re-ed   ? Neuro Re-ed Details  cued deep core HEP and for backward lunges with knee/feet  propioception, squat technique with knee and feet placement and less lumbar lordosis   ?  ? Manual Therapy  ? Manual therapy comments STM/MWM at pubic symphysis attachments R   ? ?  ?  ? ?  ? ? ? ? ? ? ? ? ? ? ? ? ? ? ? PT Long Term Goals - 08/19/21 1233   ? ?  ?  PT LONG TERM GOAL #1  ? Title Pt demo levelled pelvic girlde and shoulder across 2 visits in order to progress to deep core HEP for optimal spinal/ pelvic stability for ADLs   ? Baseline R shoulder and iliac crest lower   ? Time 2   ? Period Weeks   ? Status Achieved   ?  ? PT LONG TERM GOAL #2  ? Title Pt will demo no R convex curve at thoracic spine, decreased mm tightenss at posterior thoracic area, and report her back rib has stayed in place across 1 month in order to to return to fitness with les risk for injuries   ? Time 10   ? Period Weeks   ? Status Achieved   ?  ? PT LONG TERM GOAL #3  ? Title Pt will demo IND with proper body mechanics with baby handling, t/f, and fitness routine to minimize relapse of Sx   ? Time 8   ? Period Weeks   ? Status Achieved   ?  ? PT LONG TERM GOAL #4  ? Title Pt will increase her FOTO hip score from 49 pts to > 60 pts in order to return ADLs and fitness ( 10/20: 54pts, 04/21/21: 85   pts)   ? Time 10   ? Period Weeks   ? Status Achieved   ?  ? PT LONG TERM GOAL #5  ?  Title Pt will report no neck / shoulder pain and be able to sleep on her L side and on her back  through the night   ? Time 6   ? Period Weeks   ? Status Achieved   ?  ? PT LONG TERM GOAL #6  ? Title Pt will demo no rounded shoulders across 1 week in order to minimize neck pain and to continue lifting her babies   ? Time 8   ? Period Weeks   ? Status Achieved   ?  ? PT LONG TERM GOAL #7  ? Title Pt will demo proper lengthening of pelvic floor and coordination for pelvic stability in order to optimize pelvic girdle stabilty   ? Time 8   ? Period Weeks   ? Status Achieved   ?  ? PT LONG TERM GOAL #8  ? Title Pt will demo improved flexibility in hamstrings, quads, IT band and IND with yoga sequence in order to progress to fitness   ? Time 10   ? Period Weeks   ? Status Achieved   ?  ? PT LONG TERM GOAL  #9  ? TITLE Pt will demo decreased tightness at peroneal longus/ brevis R and gait improvements with more DF/knee flexion/ hip flexion/ push off and longer stride in order to minimize return of IT band tightness/ knee pain when running at faster spead on treadmill.   ? Baseline 2/23: hasn't began running yet  ( 06/23/21:  returned to running 1 mile 1-2 x a week) 3/21: unable to run due to new onset of back pain   ? Time 12   ? Period Weeks   ? Status On-going   ? Target Date 10/04/21   ?  ? PT LONG TERM GOAL  #10  ? TITLE Pt will demo increased mobility at R midfoot joints and less adducted knees in gait and in sitting testing position to minimize ITband tightness with running and be more IND   ? Baseline hypomobile midfoot joints R , adducted R knee 3/21: improved  mobility; unable to run yet due to new onset back pain   ? Time 12   ? Period Weeks   ? Status On-going   ? Target Date 10/04/21   ?  ? PT LONG TERM GOAL  #11  ? TITLE Patient will reduce modified Oswestry score to <20 as to demonstrate minimal disability with ADLs including improved sleeping tolerance, walking/sitting tolerance etc for better mobility with  ADLs.   ? Baseline 3/21: 28%   ? Time 12   ? Period Weeks   ? Status New   ? Target Date 10/04/21   ? ?  ?  ? ?  ? ? ? ? ? ? ? ? Plan - 08/19/21 1232   ? ? Clinical Impression Statement Focus today on technique with her kettle bell exercises lunges and past core exercises.  Cued for deep core techniques to minimize overuse of upper traps and glutes.  Modified lunges to backward lunges instead of front lunges to promote more anterior tilt of pelvis which counterbalances pt's past presentation and tendency to be in posterior tilt . Excessive cues for more DF/EV and pressure through 1st metatarsal in front lunge foot to minimize adducted knee/ tight IT band which were her past deficits.  ? ?Manual treatment was provided at the pubic bone region to minimize tightness. Pt reported decrease tenderness post Tx and demo'd improved lengthening of pelvic floor and will help with decreasing tight adductor mm which pt previously presented.   ? ?Educated patient on the importance of deep core exercises, knee and foot proprioception in her exercise routine, and modifications to exercises to minimize relapse of herniated disc in the lumbar spine and previous overactivity of pelvic floor and upper body muscles.  ? ?Pt continues to benefit from skilled PT. 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   ? Personal Factors and Comorbidities Past/Current Experience;Sex;Time since onset of injury/illness/exacerbation   ? Examination-Activity Limitations Sleep;Bend;Caring for Others;Carry;Lift;Stairs;Stand;Squat   ? Examination-Participation Restrictions Cleaning;Community Activity;Meal Prep;Laundry;[employer] Work   ? Stability/Clinical Decision Making Evolving/Moderate complexity   ? Rehab Potential Good   ?  PT Frequency 1x / week   ? PT Duration 12 weeks   10  ? PT Treatment/Interventions Functional mobility training;Therapeutic activities;Therapeutic exercise;Patient/family education;Neuromuscular re-education;Stair

## 2021-08-24 ENCOUNTER — Ambulatory Visit: Payer: Commercial Managed Care - PPO | Attending: Certified Nurse Midwife

## 2021-08-24 ENCOUNTER — Encounter: Payer: Commercial Managed Care - PPO | Admitting: Physical Therapy

## 2021-08-24 DIAGNOSIS — M25551 Pain in right hip: Secondary | ICD-10-CM | POA: Insufficient documentation

## 2021-08-24 DIAGNOSIS — M542 Cervicalgia: Secondary | ICD-10-CM | POA: Insufficient documentation

## 2021-08-24 DIAGNOSIS — G8929 Other chronic pain: Secondary | ICD-10-CM | POA: Diagnosis present

## 2021-08-24 DIAGNOSIS — M62830 Muscle spasm of back: Secondary | ICD-10-CM | POA: Insufficient documentation

## 2021-08-24 DIAGNOSIS — M533 Sacrococcygeal disorders, not elsewhere classified: Secondary | ICD-10-CM | POA: Diagnosis present

## 2021-08-24 DIAGNOSIS — M545 Low back pain, unspecified: Secondary | ICD-10-CM | POA: Diagnosis present

## 2021-08-24 DIAGNOSIS — M25512 Pain in left shoulder: Secondary | ICD-10-CM | POA: Insufficient documentation

## 2021-08-24 NOTE — Therapy (Signed)
[place] ?[facility] ?[address] ?[place], [place], [address] ?Phone: [phone]   Fax:  [phone] ? ?Physical Therapy Treatment ? ?Patient Details  ?Name: Regina Gray ?MRN: [id number] ?Date of Birth: [birth year] ?Referring Provider (PT): Serafina Royals ? ? ?Encounter Date: 08/24/2021 ? ? PT End of Session - 08/24/21 0934   ? ? Visit Number 27   ? Number of Visits 35   ? Date for PT Re-Evaluation 10/04/21   Recert 02/10/21, 04/21/21  ? PT Start Time [phone]   ? PT Stop Time 0800   ? PT Time Calculation (min) 43 min   ? Activity Tolerance Patient tolerated treatment well   ? Behavior During Therapy [facility] for tasks assessed/performed   ? ?  ?  ? ?  ? ? ?Past Medical History:  ?Diagnosis Date  ? Complication of anesthesia   ? woke up during surgery  ? ? ?Past Surgical History:  ?Procedure Laterality Date  ? ANTERIOR CRUCIATE LIGAMENT REPAIR    ? APPENDECTOMY    ? WISDOM TOOTH EXTRACTION    ? ? ?There were no vitals filed for this visit. ? ? Subjective Assessment - 08/24/21 0933   ? ? Subjective Patient reports significant stiffness of her back and upper traps.   ? 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 Pt wants to get back to [employer], running, swimming, walk.Denied falls onto tailbone, injuries, pelvic floor issues. Pt had L ACL repaired. MRI was taken 01/25/21  on her R hip with results of "No MR findings of the pelvis explain R groin pain.  Probable annular tear and disc protusion L5-S1. S1 nerve root is getting pinched."   ? Limitations Sitting;Standing;Lifting;Walking;House hold activities   ? Patient Stated Goals know what is going on and how to stretch and what kind of exercises to do   ? Currently in Pain? Yes   ? Pain Score 4    ? Pain Location --   back and upper traps  ? Pain Orientation Right   ? Pain Descriptors / Indicators Aching   ? Pain  Type Acute pain   ? Pain Onset 1 to 4 weeks ago   ? ?  ?  ? ?  ? ? ? ? ?Treatment: ?  ?  ?Palpation with intermittent STM to assess musculature: along R gastroc, quad, hip musculature, R low back, IT band  ?  ? upper trap stretch 30 seconds each side; three direction  ? ?Grade II-III mobilizations to lumbar CPA with trunk extensions L2-3, L3-4, L4-5, L5-S1; centralized pain and reduced pain levels x 15 minutes ?  ?Hamstring lengthening stretch 45 seconds each LE ?Sciatic nerve glide with df/pf while leg on PT shoulder 20x ?Single knee to chest 30 seconds each LE ?Cross body knee to chest 30 seconds each LE ?Figure 4 stretch 3 seconds each LE  ?  ? ?  ?Trigger Point Dry Needling (TDN), unbilled ?Education performed with patient regarding potential benefit of TDN. Reviewed precautions and risks with patient. Reviewed special precautions/risks over lung fields which include pneumothorax. Reviewed signs and symptoms of pneumothorax and advised pt to go to ER immediately if these symptoms develop advise them of dry needling treatment. Extensive time spent with pt to ensure full understanding of TDN risks. Pt provided verbal consent to treatment. TDN performed to  with 0.3  x 60, 0.3x 0.3 single needle placements with local twitch response (LTR). Pistoning technique utilized. Improved pain-free motion following intervention. Musculature targeted; R gluteus musculature, R obturatus internus and piriformis, R hamstring, R quad, R lumbar, R and L upper traps x13 minutes.  ?  ?Pt educated throughout session about proper posture and technique with exercises. Improved exercise technique, movement at target joints, use of target muscles after min to mod verbal, visual, tactile cues ? ? ? ? ? ?Patient has significant trigger points of bilateral upper traps this session that are released with TDN. Patient educated on stretching between sessions for reduction of return of spasms. Patient additionally has a very tight R piriformis  that is relieved by end of session. Patient will continue to benefit from skilled physical therapy to reduce pain, improve alignment, and return to PLOF ? ? ? ? ? ? ? ? ? ? ? ? ? ? ? ? ? ? PT Education - 08/24/21 0934   ? ? Education Details exercise technique, body mechanics, pain reduction   ? Person(s) Educated Patient   ? Methods Explanation;Demonstration;Tactile cues;Verbal cues   ? Comprehension Verbalized understanding;Returned demonstration;Verbal cues required;Tactile cues required   ? ?  ?  ? ?  ? ? ? ? ? ? PT Long Term Goals - 08/19/21 1233   ? ?  ? PT LONG TERM GOAL #1  ? Title Pt demo levelled pelvic girlde and shoulder across 2 visits in order to progress to deep core HEP for optimal spinal/ pelvic stability for ADLs   ? Baseline R shoulder and iliac crest lower   ? Time 2   ? Period Weeks   ? Status Achieved   ?  ? PT LONG TERM GOAL #2  ? Title Pt will demo no R convex curve at thoracic spine, decreased mm tightenss at posterior thoracic area, and report her back rib has stayed in place across 1 month in order to to return to fitness with les risk for injuries   ? Time 10   ? Period Weeks   ? Status Achieved   ?  ? PT LONG TERM GOAL #3  ? Title Pt will demo IND with proper body mechanics with baby handling, t/f, and fitness routine to minimize relapse of Sx   ? Time 8   ? Period Weeks   ? Status Achieved   ?  ? PT LONG TERM GOAL #4  ? Title Pt will increase her FOTO hip score from 49 pts to > 60 pts in order to return ADLs and fitness ( 10/20: [id number], 04/21/21: 85   pts)   ? Time 10   ? Period Weeks   ? Status Achieved   ?  ? PT LONG TERM GOAL #5  ? Title Pt will report no neck / shoulder pain and be able to sleep on her L side and on her back  through the night   ? Time 6   ? Period Weeks   ? Status Achieved   ?  ? PT LONG TERM GOAL #6  ? Title Pt will demo no rounded shoulders across 1 week in order to minimize neck pain and to continue lifting her babies   ? Time 8   ? Period Weeks   ? Status  Achieved   ?  ? PT LONG TERM GOAL #7  ? Title Pt will demo proper lengthening of pelvic floor and coordination for pelvic stability in order to optimize pelvic girdle stabilty   ?  Time 8   ? Period Weeks   ? Status Achieved   ?  ? PT LONG TERM GOAL #8  ? Title Pt will demo improved flexibility in hamstrings, quads, IT band and IND with yoga sequence in order to progress to fitness   ? Time 10   ? Period Weeks   ? Status Achieved   ?  ? PT LONG TERM GOAL  #9  ? TITLE Pt will demo decreased tightness at peroneal longus/ brevis R and gait improvements with more DF/knee flexion/ hip flexion/ push off and longer stride in order to minimize return of IT band tightness/ knee pain when running at faster spead on treadmill.   ? Baseline 2/23: hasn't began running yet  ( 06/23/21:  returned to running 1 mile 1-2 x a week) 3/21: unable to run due to new onset of back pain   ? Time 12   ? Period Weeks   ? Status On-going   ? Target Date 10/04/21   ?  ? PT LONG TERM GOAL  #10  ? TITLE Pt will demo increased mobility at R midfoot joints and less adducted knees in gait and in sitting testing position to minimize ITband tightness with running and be more IND   ? Baseline hypomobile midfoot joints R , adducted R knee 3/21: improved mobility; unable to run yet due to new onset back pain   ? Time 12   ? Period Weeks   ? Status On-going   ? Target Date 10/04/21   ?  ? PT LONG TERM GOAL  #11  ? TITLE Patient will reduce modified Oswestry score to <20 as to demonstrate minimal disability with ADLs including improved sleeping tolerance, walking/sitting tolerance etc for better mobility with ADLs.   ? Baseline 3/21: 28%   ? Time 12   ? Period Weeks   ? Status New   ? Target Date 10/04/21   ? ?  ?  ? ?  ? ? ? ? ? ? ? ? Plan - 08/24/21 0935   ? ? Clinical Impression Statement Patient has significant trigger points of bilateral upper traps this session that are released with TDN. Patient educated on stretching between sessions for reduction of  return of spasms. Patient additionally has a very tight R piriformis that is relieved by end of session. Patient will continue to benefit from skilled physical therapy to reduce pain, improve alignment, and re

## 2021-08-31 ENCOUNTER — Ambulatory Visit: Payer: Commercial Managed Care - PPO | Admitting: Physical Therapy

## 2021-08-31 DIAGNOSIS — M25551 Pain in right hip: Secondary | ICD-10-CM

## 2021-08-31 DIAGNOSIS — M533 Sacrococcygeal disorders, not elsewhere classified: Secondary | ICD-10-CM

## 2021-08-31 DIAGNOSIS — M542 Cervicalgia: Secondary | ICD-10-CM

## 2021-08-31 DIAGNOSIS — G8929 Other chronic pain: Secondary | ICD-10-CM

## 2021-08-31 DIAGNOSIS — M62830 Muscle spasm of back: Secondary | ICD-10-CM

## 2021-08-31 NOTE — Patient Instructions (Signed)
Squats and side step squats in lieu of backward lunges  ? ? ?Lateral lunge with knee and toes out slight ?Focus on knee ? ? ?__ ? ?Low cobra: modified  ? ?On belly, palms under armpits, elbows pointed to ceiling  ?Inhale: lengthen crown of the head away from shoulders ?Exhale, feel belly hug in and press palms into the floor, squeezing elbows/shoulder blades towards each other while chest lifts about 5 cm off of the floor. You should feel the hinging movement at mid back and not the low back.  ? ? ?Locust pose  ?Pillow under hips if needed for decreased low back pain ? ?Palms face midline by hips  ?Finger tips shooting straight down  ?Imagine holding pencil under your armpits ?Draw shoulders away from ears ?Inhale ?Exhale lift chest up slightly without feeling it in your back. The bend happens in the midback  ?(keep chin tucked)  ? ?

## 2021-08-31 NOTE — Therapy (Signed)
[place] ?[place] [facility] ?[place], [place], [address] ?Phone: [phone]   Fax:  [phone] ? ?Physical Therapy Treatment ? ?Patient Details  ?Name: Regina Gray ?MRN: [id number] ?Date of Birth: 1991-01-24 ?Referring Provider (PT): Dani Gobble ? ? ?Encounter Date: 08/31/2021 ? ? PT End of Session - 08/31/21 0902   ? ? Visit Number 28   ? Number of Visits 35   ? Date for PT Re-Evaluation [id number]   Recert [id number], [id number]  ? PT Start Time 0805   ? PT Stop Time [id number]   ? PT Time Calculation (min) 59 min   ? Activity Tolerance Patient tolerated treatment well   ? Behavior During Therapy [facility] for tasks assessed/performed   ? ?  ?  ? ?  ? ? ?Past Medical History:  ?Diagnosis Date  ? Complication of anesthesia   ? woke up during surgery  ? ? ?Past Surgical History:  ?Procedure Laterality Date  ? ANTERIOR CRUCIATE LIGAMENT REPAIR    ? APPENDECTOMY    ? WISDOM TOOTH EXTRACTION    ? ? ?There were no vitals filed for this visit. ? ? Subjective Assessment - 08/31/21 0810   ? ? Subjective Patients reports the twinge at the R low ab to belly button  is less intense since last session. It still come on like a muscle pain once a day randomly when she is upright walking or standing.   ? 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 Pt wants to get back to [employer], running, swimming, walk.Denied falls onto tailbone, injuries, pelvic floor issues. Pt had L ACL repaired. MRI was taken 01/25/21  on her R hip with results of "No MR findings of the pelvis explain R groin pain.  Probable annular tear and disc protusion L5-S1. S1 nerve root is getting pinched."   ? Limitations Sitting;Standing;Lifting;Walking;House hold activities   ? Patient Stated Goals know what is going on and how to stretch and what kind of exercises to do   ? Pain Onset 1 to 4 weeks ago   ? ?   ?  ? ?  ? ? ? ? ? [facility] PT Assessment - 08/31/21 0816   ? ?  ? Coordination  ? Coordination and Movement Description sequential activation of scapular mm for depression/ retraction   ?  ? Lunges  ? Comments front leg with poor foot/hip alignment, wide BOS without cue,  lateral lunge: lateral foot pointed straight,   ?  ? Palpation  ? SI assessment  R ilia more anteriorly rotated   ? Palpation comment tightness / tenderness at pubic symphysis attachments R, gracilis, adductor, sartoris,  hamstring R   ? ?  ?  ? ?  ? ? ? ? ? ? ? ? ? ? ? ? ? ? ? ? [facility] PT Treatment/Exercise - 08/31/21 0900   ? ?  ? Therapeutic Activites   ? Other Therapeutic Activities explained to withhold backward lunges this week due to pelvic obliquity   ?  ? Neuro Re-ed   ? Neuro Re-ed Details  cued for proper alignment in lateral lunges, cued for scapular retraction depression HEP   ?  ? Manual Therapy  ? Manual therapy comments STM/MWM at pubic symphysis attachments R, femoral triangle and hamstring to promote mobilty   ? ?  ?  ? ?  ? ? ? ? ? ? ? ? ? ? ? ? ? ? ?  PT Long Term Goals - 08/19/21 1233   ? ?  ? PT LONG TERM GOAL #1  ? Title Pt demo levelled pelvic girlde and shoulder across 2 visits in order to progress to deep core HEP for optimal spinal/ pelvic stability for ADLs   ? Baseline R shoulder and iliac crest lower   ? Time 2   ? Period Weeks   ? Status Achieved   ?  ? PT LONG TERM GOAL #2  ? Title Pt will demo no R convex curve at thoracic spine, decreased mm tightenss at posterior thoracic area, and report her back rib has stayed in place across 1 month in order to to return to fitness with les risk for injuries   ? Time 10   ? Period Weeks   ? Status Achieved   ?  ? PT LONG TERM GOAL #3  ? Title Pt will demo IND with proper body mechanics with baby handling, t/f, and fitness routine to minimize relapse of Sx   ? Time 8   ? Period Weeks   ? Status Achieved   ?  ? PT LONG TERM GOAL #4  ? Title Pt will increase her FOTO hip score  from 49 pts to > 60 pts in order to return ADLs and fitness ( 10/20: 54pts, 04/21/21: 85   pts)   ? Time 10   ? Period Weeks   ? Status Achieved   ?  ? PT LONG TERM GOAL #5  ? Title Pt will report no neck / shoulder pain and be able to sleep on her L side and on her back  through the night   ? Time 6   ? Period Weeks   ? Status Achieved   ?  ? PT LONG TERM GOAL #6  ? Title Pt will demo no rounded shoulders across 1 week in order to minimize neck pain and to continue lifting her babies   ? Time 8   ? Period Weeks   ? Status Achieved   ?  ? PT LONG TERM GOAL #7  ? Title Pt will demo proper lengthening of pelvic floor and coordination for pelvic stability in order to optimize pelvic girdle stabilty   ? Time 8   ? Period Weeks   ? Status Achieved   ?  ? PT LONG TERM GOAL #8  ? Title Pt will demo improved flexibility in hamstrings, quads, IT band and IND with yoga sequence in order to progress to fitness   ? Time 10   ? Period Weeks   ? Status Achieved   ?  ? PT LONG TERM GOAL  #9  ? TITLE Pt will demo decreased tightness at peroneal longus/ brevis R and gait improvements with more DF/knee flexion/ hip flexion/ push off and longer stride in order to minimize return of IT band tightness/ knee pain when running at faster spead on treadmill.   ? Baseline 2/23: hasn't began running yet  ( 06/23/21:  returned to running 1 mile 1-2 x a week) 3/21: unable to run due to new onset of back pain   ? Time 12   ? Period Weeks   ? Status On-going   ? Target Date 10/04/21   ?  ? PT LONG TERM GOAL  #10  ? TITLE Pt will demo increased mobility at R midfoot joints and less adducted knees in gait and in sitting testing position to minimize ITband tightness with running and be more IND   ?  Baseline hypomobile midfoot joints R , adducted R knee 3/21: improved mobility; unable to run yet due to new onset back pain   ? Time 12   ? Period Weeks   ? Status On-going   ? Target Date 10/04/21   ?  ? PT LONG TERM GOAL  #11  ? TITLE Patient will reduce  modified Oswestry score to <20 as to demonstrate minimal disability with ADLs including improved sleeping tolerance, walking/sitting tolerance etc for better mobility with ADLs.   ? Baseline 3/21: 28%   ? Time 12   ? Period Weeks   ? Status New   ? Target Date 10/04/21   ? ?  ?  ? ?  ? ? ? ? ? ? ? ? Plan - 08/31/21 0902   ? ? Clinical Impression Statement Pt demo'd R anteriorly rotated ilia today along with poor alignment and technique with backward lunges and lateral lunges. Pt demo'd improved form with cues for lateral lunges and was educated to withhold on backward lunges due to pelvic obliquities and add more squats to promote pelvic stability.  ? ?Manual Tx addressed the pubic symphysis mm attachments on R which is associated with her c/o of twinge from groin to R of umbilicus. 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 Pt showed improved mobility and less tenderness at the pubic symphysis area. Advised pt to communicate with PCP and orthopedic MD about the lymph node at R groin she has noticed for a year that is swollen. Pt voiced understanding.  ? ?Provided cues and modifications for less upper trap overuse in prone exercises for upper body, modifying the exercises she selected for self.  PLan to continue educating pt on principles to promote deep core and good alignment in fitness routine to minimize relapse of Sx and past structural asymmetries. Pt continues to benefit from skilled PT.  ? Personal Factors and Comorbidities Past/Current Experience;Sex;Time since onset of injury/illness/exacerbation   ? Examination-Activity Limitations Sleep;Bend;Caring for Others;Carry;Lift;Stairs;Stand;Squat   ? Examination-Participation Restrictions Cleaning;Community Activity;Meal Prep;Laundry;[place] [employer] Work   ? Stability/Clinical Decision Making Evolving/Moderate complexity   ? Rehab Potential Good   ?  PT Frequency 1x / week   ? PT Duration 12 weeks   10  ? PT Treatment/Interventions Functional mobility training;Therapeutic activities;Therapeutic exercise;Patient/family education;Neuromuscular re-educa

## 2021-09-06 ENCOUNTER — Ambulatory Visit: Payer: Commercial Managed Care - PPO | Admitting: Physical Therapy

## 2021-09-06 NOTE — Progress Notes (Signed)
?Terrilee Files D.O. ? Sports Medicine ?[address] [place] [address] ?Phone: [phone] ?Subjective:   ?I, Nadine Counts, am serving as a Neurosurgeon for Dr. Antoine Primas. ?This visit occurred during the SARS-CoV-2 public health emergency.  Safety protocols were in place, including screening questions prior to the visit, additional usage of staff PPE, and extensive cleaning of exam room while observing appropriate contact time as indicated for disinfecting solutions.  ? ?I'm seeing this patient by the request  of:  Erasmo Downer, MD ? ?CC: back and neck pain f/u  ? ?[id number]  ?Regina Gray is a 31 y.o. female coming in with complaint of back and neck pain. OMT 07/20/2021.  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  Was given prednisone and was continuing with physical therapy.  Patient states same per usual. Feeling a little better. After taking prednisone had a funny reaction over screw in knee. Lasted for about a week. No other complaints. ? ?Medications patient has been prescribed: Prednisone ? ?Taking: ? ? ?  ? ? ? ? ?Reviewed prior external information including notes and imaging from previsou exam, outside providers and external EMR if available.  ? ?As well as notes that were available from care everywhere and other healthcare systems. ? ?Past medical history, social, surgical and family history all reviewed in electronic medical record.  No pertanent information unless stated regarding to the chief complaint.  ? ?Past Medical History:  ?Diagnosis Date  ? Complication of anesthesia   ? woke up during surgery  ?  ?Allergies  ?Allergen Reactions  ? Codeine   ? Hydromorphone   ?  Other reaction(s): Vomiting  ? Oxycodone   ?  Other reaction(s): Vomiting  ? Sulfa Antibiotics   ? ? ? ?Review of Systems: ? No headache, visual changes, nausea, vomiting, diarrhea, constipation, dizziness, abdominal pain, skin rash, fevers, chills, night sweats, weight  loss, swollen lymph nodes, body aches, joint swelling, chest pain, shortness of breath, mood changes. POSITIVE muscle aches ? ?Objective  ?Blood pressure 122/80, pulse 85, height 5\' 9"  (1.753 m), weight 189 lb (85.7 kg), SpO2 97 %, currently breastfeeding. ?  ?General: No apparent distress alert and oriented x3 mood and affect normal, dressed appropriately.  ?HEENT: Pupils equal, extraocular movements intact  ?Respiratory: Patient's speak in full sentences and does not appear short of breath  ?Cardiovascular: No lower extremity edema, non tender, no erythema  ?Low back exam does have tightness noted of the hip flexor on the right side.  The patient does have tightness noted of the iliac as it appears on the right side as well.  Tender to palpation over the sacroiliac joint as well. ? ?Osteopathic findings ? ?C2 flexed rotated and side bent right ?C7 flexed rotated and side bent left ?T3 extended rotated and side bent right inhaled rib ?T8 extended rotated and side bent left ?L2 flexed rotated and side bent right ?Sacrum right on right ? ? ? ? ?  ?Assessment and Plan: ? ?Degeneration of intervertebral disc at L5-S1 level ?Low, but doing better.  Still has a tightness of the hip flexor on the right side that seems to be more of the iliac as well.  Discussed icing regimen and home exercises, discussed which activities to do which ones to avoid, increase activity slowly.  Follow-up with me again in 6 to 8 weeks. ?  ? ?Nonallopathic problems ? ?Decision today to treat with OMT was based on  Physical Exam ? ?After verbal consent patient was treated with HVLA, ME, FPR techniques in cervical, rib, thoracic, lumbar, and sacral  areas ? ?Patient tolerated the procedure well with improvement in symptoms ? ?Patient given exercises, stretches and lifestyle modifications ? ?See medications in patient instructions if given ? ?Patient will follow up in 4-8 weeks ? ?  ? ? ?The above documentation has been reviewed and is accurate  and complete Judi Saa, DO ? ? ? ?  ? ? Note: This dictation was prepared with Dragon dictation along with smaller phrase technology. Any transcriptional errors that result from this process are unintentional.    ?  ?  ? ?

## 2021-09-07 ENCOUNTER — Encounter: Payer: Self-pay | Admitting: Family Medicine

## 2021-09-07 ENCOUNTER — Ambulatory Visit (INDEPENDENT_AMBULATORY_CARE_PROVIDER_SITE_OTHER): Payer: Commercial Managed Care - PPO | Admitting: Family Medicine

## 2021-09-07 VITALS — BP 122/80 | HR 85 | Ht 69.0 in | Wt 189.0 lb

## 2021-09-07 DIAGNOSIS — M9903 Segmental and somatic dysfunction of lumbar region: Secondary | ICD-10-CM | POA: Diagnosis not present

## 2021-09-07 DIAGNOSIS — M9901 Segmental and somatic dysfunction of cervical region: Secondary | ICD-10-CM | POA: Diagnosis not present

## 2021-09-07 DIAGNOSIS — M9902 Segmental and somatic dysfunction of thoracic region: Secondary | ICD-10-CM | POA: Diagnosis not present

## 2021-09-07 DIAGNOSIS — M5137 Other intervertebral disc degeneration, lumbosacral region: Secondary | ICD-10-CM | POA: Diagnosis not present

## 2021-09-07 DIAGNOSIS — M9908 Segmental and somatic dysfunction of rib cage: Secondary | ICD-10-CM

## 2021-09-07 DIAGNOSIS — M9904 Segmental and somatic dysfunction of sacral region: Secondary | ICD-10-CM | POA: Diagnosis not present

## 2021-09-07 NOTE — Assessment & Plan Note (Signed)
Low, but doing better.  Still has a tightness of the hip flexor on the right side that seems to be more of the iliac [place] as well.  Discussed icing regimen and home exercises, discussed which activities to do which ones to avoid, increase activity slowly.  Follow-up with me again in 6 to 8 weeks. ?

## 2021-09-07 NOTE — Patient Instructions (Addendum)
Good to see you! ?You are doing much much better ?See you again in 2-3 months pelvic shear noted ?

## 2021-09-13 ENCOUNTER — Telehealth: Payer: Self-pay | Admitting: Physical Therapy

## 2021-09-13 ENCOUNTER — Ambulatory Visit: Payer: Commercial Managed Care - PPO | Admitting: Physical Therapy

## 2021-09-13 NOTE — Telephone Encounter (Signed)
Physical therapist left a voice message re: missed appt last week and today and whether pt was able to f/u with her medical providers about her question and concern about a lymph node at groin as lymmh node assessment is outside the scope of physical therapy. Advised pt to continue with HEP and to utilize minisquats and downward facing dog technique for transfers from stand<> floor for optimal pelvic girdle alignment.

## 2021-09-20 ENCOUNTER — Ambulatory Visit: Payer: Commercial Managed Care - PPO

## 2021-09-20 DIAGNOSIS — M25551 Pain in right hip: Secondary | ICD-10-CM

## 2021-09-20 DIAGNOSIS — M62830 Muscle spasm of back: Secondary | ICD-10-CM

## 2021-09-20 DIAGNOSIS — M533 Sacrococcygeal disorders, not elsewhere classified: Secondary | ICD-10-CM
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
## 2021-09-27 ENCOUNTER — Ambulatory Visit: Payer: Commercial Managed Care - PPO | Attending: Certified Nurse Midwife | Admitting: Physical Therapy

## 2021-09-27 DIAGNOSIS — M533 Sacrococcygeal disorders, not elsewhere classified: Secondary | ICD-10-CM | POA: Diagnosis present

## 2021-09-27 DIAGNOSIS — M25512 Pain in left shoulder: Secondary | ICD-10-CM | POA: Insufficient documentation

## 2021-09-27 DIAGNOSIS — M62830 Muscle spasm of back: Secondary | ICD-10-CM | POA: Diagnosis present

## 2021-09-27 DIAGNOSIS — M25551 Pain in right hip: Secondary | ICD-10-CM | POA: Diagnosis present

## 2021-09-27 DIAGNOSIS — G8929 Other chronic pain: Secondary | ICD-10-CM | POA: Insufficient documentation

## 2021-09-27 DIAGNOSIS — M545 Low back pain, unspecified: Secondary | ICD-10-CM | POA: Diagnosis present

## 2021-09-27 DIAGNOSIS — M542 Cervicalgia: Secondary | ICD-10-CM | POA: Insufficient documentation
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
## 2021-09-27 NOTE — Patient Instructions (Addendum)
Ankle strengthening on L with band band wrapped around outer L side of foot ballmound of L foot pressing onto band , R foot is placed on top of band hip width apart, with the ballmound ,  R hand holds the band 15 reps swinging L pinky toe out to the L  X 2x day  Ankle strengthening on R with band band wrapped around outer R side of foot ballmound of R foot pressing onto band , L foot is placed on top of band hip width apart, with the ballmound ,  R hand holds the band 30 reps swinging R pinky toe out to the R  X 2x day   ___      Frog stretch: laying on belly with pillow under hips, knees bent, inhale do nothing, exhale let ankles fall apart 45 deg  20 reps   __  Stretches :    6 directions of the spine :  5 reps   Rotation: armswings to rotate the spine  Side flexion: arm overhead arching like a rainbow , both sides  Forward bend and strainghtening up: minisquat-  Like you are about to sit and then stand up with a slight lift in the chest   Mini squat-figure 4 (piriformis) hip flexor stretch on step hip flexor twist on step    During the day:  Stretches : (Cuing provided for proper alignment6 directions 6 directions of psine   sidebend  Rotation with trunk and arm swings  minisquat ( knees behind toes) , pull arms back, rise up, chest lifts, look up   ____  Neck / shoulder stretches:    _wall stretch Clock stretch with head turns :  stand perpendicular to the wall, L side to the wall Tilt head to wall  Place L palm at 7 o clock Chin tuck like you are looking into armpit Look at "[place] on giant map  Swivel head with chin tucked to look in upper corner of ceiling as if you are look at  [place] on giant map "   5 -10 reps   Switch direction, R palm on wall at 5 o 'clock   Chin tuck like you are looking into armpit Look at "[place]  on giant map  Swivel head with chin tucked to look in upper corner of ceiling as if you are look at  [facility] on giant map "    5 -10 reps    ___  Alternate holding daughter on R side not on L only, and also use ski track stance as an option when holding dtr

## 2021-10-03 ENCOUNTER — Ambulatory Visit: Payer: Commercial Managed Care - PPO

## 2021-10-10 ENCOUNTER — Ambulatory Visit: Payer: Commercial Managed Care - PPO | Admitting: Physical Therapy

## 2021-10-10 DIAGNOSIS — G8929 Other chronic pain: Secondary | ICD-10-CM

## 2021-10-10 DIAGNOSIS — M25551 Pain in right hip: Secondary | ICD-10-CM | POA: Diagnosis not present

## 2021-10-10 DIAGNOSIS — M533 Sacrococcygeal disorders, not elsewhere classified: Secondary | ICD-10-CM

## 2021-10-10 DIAGNOSIS — M62830 Muscle spasm of back: Secondary | ICD-10-CM

## 2021-10-10 DIAGNOSIS — M542 Cervicalgia: Secondary | ICD-10-CM

## 2021-10-10 NOTE — Patient Instructions (Signed)
   Reclined twist for hips and side of the hips/ legs  Lay on your back, knees bend Scoot hips to the R , leave shoulders in place Drop knees to the L side resting onto pillows to keep leg at the same width of hips Pillow under L thigh to minimize too much strain    __  Avoid twists in 1st trimester
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
## 2021-10-18 ENCOUNTER — Ambulatory Visit: Payer: Commercial Managed Care - PPO

## 2021-10-18 DIAGNOSIS — M545 Low back pain, unspecified: Secondary | ICD-10-CM

## 2021-10-18 DIAGNOSIS — M533 Sacrococcygeal disorders, not elsewhere classified: Secondary | ICD-10-CM

## 2021-10-18 DIAGNOSIS — M25551 Pain in right hip: Secondary | ICD-10-CM

## 2021-10-18 DIAGNOSIS — M62830 Muscle spasm of back: Secondary | ICD-10-CM

## 2021-11-04 ENCOUNTER — Telehealth: Payer: Self-pay

## 2021-11-04 NOTE — Telephone Encounter (Signed)
My Chart message also sent to patient.

## 2021-11-04 NOTE — Telephone Encounter (Signed)
Left message for patient to confirm if she is pregnant. If so, Dr. Katrinka Blazing does not recommend OMT in 1st tri. Will need to reschedule after 13th week per a verbal from Dr. Katrinka Blazing.

## 2021-11-04 NOTE — Telephone Encounter (Signed)
Spoke to patient.  Confirmed that she is pregnant. Appointment has been rescheduled.

## 2021-11-04 NOTE — Telephone Encounter (Signed)
Attempted to contact patient.  Left voice mail to call back.

## 2021-11-07 ENCOUNTER — Ambulatory Visit: Payer: Commercial Managed Care - PPO | Admitting: Family Medicine
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
## 2021-11-14 ENCOUNTER — Ambulatory Visit: Payer: Commercial Managed Care - PPO | Attending: Certified Nurse Midwife

## 2021-11-14 DIAGNOSIS — M25512 Pain in left shoulder: Secondary | ICD-10-CM | POA: Diagnosis present

## 2021-11-14 DIAGNOSIS — M545 Low back pain, unspecified: Secondary | ICD-10-CM | POA: Insufficient documentation

## 2021-11-14 DIAGNOSIS — M542 Cervicalgia: Secondary | ICD-10-CM | POA: Insufficient documentation

## 2021-11-14 DIAGNOSIS — M62830 Muscle spasm of back: Secondary | ICD-10-CM | POA: Diagnosis present

## 2021-11-14 DIAGNOSIS — M25551 Pain in right hip: Secondary | ICD-10-CM | POA: Insufficient documentation

## 2021-11-14 DIAGNOSIS — G8929 Other chronic pain: Secondary | ICD-10-CM | POA: Diagnosis present

## 2021-11-14 DIAGNOSIS — M533 Sacrococcygeal disorders, not elsewhere classified: Secondary | ICD-10-CM | POA: Insufficient documentation

## 2021-11-17 ENCOUNTER — Ambulatory Visit: Payer: Commercial Managed Care - PPO | Admitting: Physical Therapy

## 2021-11-17 DIAGNOSIS — M25551 Pain in right hip: Secondary | ICD-10-CM | POA: Diagnosis not present

## 2021-11-17 DIAGNOSIS — M542 Cervicalgia: Secondary | ICD-10-CM

## 2021-11-17 DIAGNOSIS — G8929 Other chronic pain: Secondary | ICD-10-CM

## 2021-11-17 DIAGNOSIS — M533 Sacrococcygeal disorders, not elsewhere classified: Secondary | ICD-10-CM

## 2021-11-17 DIAGNOSIS — M62830 Muscle spasm of back: Secondary | ICD-10-CM

## 2021-11-17 DIAGNOSIS — M545 Low back pain, unspecified: Secondary | ICD-10-CM
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
## 2021-11-17 NOTE — Patient Instructions (Signed)
   Clam Shell 45 Degrees  Lying with hips and knees bent 45, one pillow between knees and ankles. Heel together, toes apart like ballerina,  Lift knee with exhale while pressing heels together. Be sure pelvis does not roll backward/ not tucked under. Do not arch back.  Do 20 times, each leg, 2 times per day.    Complimentary stretch: Figure-4   3 breaths    Alternative if hip is tight,  Sit at 45 deg turn with R leg and knee on edge of chair/ bench  Repeat with other side  ___  Multifidis twist   Band is on doorknob: sit facing perpendicular to door , sit halfway towards front of chair, firm through 4 points of contact at buttocks and feet. Feet are placed hip with apart.   Twisting trunk without moving the hips and knees Hold band at the level of ribcage, elbows bent,shoulder blades roll back and down like squeezing a pencil under armpit   Exhale twist,.10-15 deg away from door without moving your hips/ knees, press more weight on the side of the sitting bones/ foot opp of your direction of turn as your counterweight. Continue to maintain equal weight through legs.  Keep knee unlocked.  30 reps   __  Avoid the ball squeeze exercise  __  Mermaid stretch with one arm pushup  The knee rock exercise , use both hands to not sit on tailbone  __  Deep core 1-2
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
## 2021-11-24 ENCOUNTER — Ambulatory Visit: Payer: Commercial Managed Care - PPO | Attending: Certified Nurse Midwife

## 2021-11-24 DIAGNOSIS — G8929 Other chronic pain: Secondary | ICD-10-CM | POA: Diagnosis present

## 2021-11-24 DIAGNOSIS — M545 Low back pain, unspecified: Secondary | ICD-10-CM | POA: Diagnosis present

## 2021-11-24 DIAGNOSIS — M25551 Pain in right hip: Secondary | ICD-10-CM | POA: Insufficient documentation

## 2021-11-24 DIAGNOSIS — M533 Sacrococcygeal disorders, not elsewhere classified: Secondary | ICD-10-CM | POA: Diagnosis present

## 2021-11-24 DIAGNOSIS — M62830 Muscle spasm of back: Secondary | ICD-10-CM | POA: Insufficient documentation

## 2021-12-02 NOTE — Progress Notes (Unsigned)
Regina Gray [address] [place] [address] Phone: [phone] Subjective:   Regina Gray, am serving as a scribe for Dr. Antoine Primas.  I'm seeing this patient by the request  of:  Bacigalupo, Marzella Schlein, MD  CC: Low back pain  [id number]  Regina Gray is a 31 y.o. female coming in with complaint of back and neck pain. OMT 09/07/2021. Patient is pregnant. Patient states same per usual. Pain has localized to SI/low back. Working with PT. All on right side.  Medications patient has been prescribed: None  Taking:         Reviewed prior external information including notes and imaging from previsou exam, outside providers and external EMR if available.   As well as notes that were available from care everywhere and other healthcare systems.  Past medical history, social, surgical and family history all reviewed in electronic medical record.  No pertanent information unless stated regarding to the chief complaint.   Past Medical History:  Diagnosis Date   Complication of anesthesia    woke up during surgery    Allergies  Allergen Reactions   Codeine    Hydromorphone     Other reaction(s): Vomiting   Oxycodone     Other reaction(s): Vomiting   Sulfa Antibiotics      Review of Systems:  No headache, visual changes, nausea, vomiting, diarrhea, constipation, dizziness, abdominal pain, skin rash, fevers, chills, night sweats, weight loss, swollen lymph nodes, body aches, joint swelling, chest pain, shortness of breath, mood changes. POSITIVE muscle aches  Objective  Blood pressure 116/80, pulse 68, height 5\' 9"  (1.753 m), weight 191 lb (86.6 kg), SpO2 97 %, currently breastfeeding.   General: No apparent distress alert and oriented x3 mood and affect normal, dressed appropriately.  HEENT: Pupils equal, extraocular movements intact  Respiratory: Patient's speak in full sentences and does not appear short of breath   Cardiovascular: No lower extremity edema, non tender, no erythema  Gait MSK:  Back does have some loss of lordosis.  Some tenderness to palpation in the paraspinal musculature.  Patient does have hypermobility is noted of the hip.  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  I do not think the patient is having some difficulty with the hypermobility from the elevated progesterone during pregnancy.  Discussed icing regimen and home exercises.  Discussed which activities to do which ones to avoid, increase activity slowly over the course the next several weeks.  Follow-up with me again in 4 to 6 weeks.    Nonallopathic problems  Decision today to treat with OMT was based on Physical Exam  After verbal consent patient was treated with HVLA, ME, FPR techniques in cervical, rib, thoracic, lumbar, and sacral  areas  Patient tolerated the procedure well with improvement in symptoms  Patient given exercises, stretches and lifestyle modifications  See medications in patient instructions if given  Patient will follow up in 4-6 weeks     The above documentation has been reviewed and is accurate and complete , DO  Note: This dictation was prepared with Dragon dictation along with smaller phrase technology. Any transcriptional errors that result from this process are unintentional.

## 2021-12-05 ENCOUNTER — Encounter: Payer: Self-pay | Admitting: Family Medicine

## 2021-12-05 ENCOUNTER — Ambulatory Visit (INDEPENDENT_AMBULATORY_CARE_PROVIDER_SITE_OTHER): Payer: Commercial Managed Care - PPO | Admitting: Family Medicine

## 2021-12-05 VITALS — BP 116/80 | HR 68 | Ht 69.0 in | Wt 191.0 lb

## 2021-12-05 DIAGNOSIS — M9904 Segmental and somatic dysfunction of sacral region: Secondary | ICD-10-CM

## 2021-12-05 DIAGNOSIS — M9908 Segmental and somatic dysfunction of rib cage: Secondary | ICD-10-CM

## 2021-12-05 DIAGNOSIS — M9901 Segmental and somatic dysfunction of cervical region: Secondary | ICD-10-CM

## 2021-12-05 DIAGNOSIS — M9902 Segmental and somatic dysfunction of thoracic region: Secondary | ICD-10-CM

## 2021-12-05 DIAGNOSIS — M9903 Segmental and somatic dysfunction of lumbar region: Secondary | ICD-10-CM

## 2021-12-05 DIAGNOSIS — M24551 Contracture, right hip: Secondary | ICD-10-CM | POA: Diagnosis not present

## 2021-12-05 NOTE — Patient Instructions (Addendum)
Good to see you! Baby is doing a number Limit the ROM  Stay as active as you can

## 2021-12-05 NOTE — Assessment & Plan Note (Signed)
Hip flexor tightness noted again.  I do not think the patient is having some difficulty with the hypermobility from the elevated progesterone during pregnancy.  Discussed icing regimen and home exercises.  Discussed which activities to do which ones to avoid, increase activity slowly over the course the next several weeks.  Follow-up with me again in 4 to 6 weeks.

## 2021-12-19 ENCOUNTER — Ambulatory Visit: Payer: Commercial Managed Care - PPO | Admitting: Physical Therapy

## 2021-12-19 ENCOUNTER — Telehealth: Payer: Self-pay | Admitting: Physical Therapy

## 2021-12-19 NOTE — Telephone Encounter (Signed)
Physical therapist called pt for missed pt. Pt had a OB/GYN appt this morning and had to miss PT appt. Discussed about future appts.

## 2021-12-28 ENCOUNTER — Ambulatory Visit: Payer: Commercial Managed Care - PPO | Admitting: Physical Therapy

## 2022-01-05 NOTE — Progress Notes (Unsigned)
[name] [address] [place] [address] Phone: [phone] Subjective:   INadine Counts, am serving as a scribe for Dr. Antoine Primas.  I'm seeing this patient by the request  of:  Bacigalupo, Marzella Schlein, MD  CC: back and neck pain f/u   [id number]  Regina Gray is a 31 y.o. female coming in with complaint of back and neck pain. OMT on 12/05/2021. Patient states same per usual. Here for manipulation. No new issues.  Medications patient has been prescribed: None  Taking:         Reviewed prior external information including notes and imaging from previsou exam, outside providers and external EMR if available.   As well as notes that were available from care everywhere and other healthcare systems.  Past medical history, social, surgical and family history all reviewed in electronic medical record.  No pertanent information unless stated regarding to the chief complaint.   Past Medical History:  Diagnosis Date   Complication of anesthesia    woke up during surgery    Allergies  Allergen Reactions   Codeine    Hydromorphone     Other reaction(s): Vomiting   Oxycodone     Other reaction(s): Vomiting   Sulfa Antibiotics      Review of Systems:  No headache, visual changes, nausea, vomiting, diarrhea, constipation, dizziness, abdominal pain, skin rash, fevers, chills, night sweats, weight loss, swollen lymph nodes, body aches, joint swelling, chest pain, shortness of breath, mood changes. POSITIVE muscle aches  Objective  Blood pressure 122/74, pulse (!) 102, height 5\' 9"  (1.753 m), weight 200 lb (90.7 kg), SpO2 97 %, currently breastfeeding.   General: No apparent distress alert and oriented x3 mood and affect normal, dressed appropriately.  Patient is pregnant HEENT: Pupils equal, extraocular movements intact  Respiratory: Patient's speak in full sentences and does not appear short of breath  Cardiovascular: No  lower extremity edema, non tender, no erythema  Gait not MSK:  Back no increase in lordosis noted.  Mild tenderness febrile right greater than left.  Still tightness of the hip flexor  Osteopathic findings  C3 flexed rotated and side bent right T3 extended rotated and side bent right inhaled rib T7 extended rotated and side bent left L1 flexed rotated and side bent right Sacrum right on right     Assessment and Plan:  Hip flexor tightness, right Continues to have the tightness noted.  Does have some degenerative disc disease as well.  The patient is pregnant and is unable to take any other medications.  Patient will continue to work on core strengthening.  I believe the patient should do relatively well though with conservative therapy at home.  Follow-up with me again in 4 to 6 weeks.    Nonallopathic problems  Decision today to treat with OMT was based on Physical Exam  After verbal consent patient was treated with HVLA, ME, FPR techniques in cervical, rib, thoracic, lumbar, and sacral  areas  Patient tolerated the procedure well with improvement in symptoms  Patient given exercises, stretches and lifestyle modifications  See medications in patient instructions if given  Patient will follow up in 4-8 weeks    The above documentation has been reviewed and is accurate and complete , DO          Note: This dictation was prepared with Dragon dictation along with smaller phrase technology. Any transcriptional errors that result from this process are unintentional.

## 2022-01-09 ENCOUNTER — Ambulatory Visit: Payer: Commercial Managed Care - PPO | Attending: Certified Nurse Midwife | Admitting: Physical Therapy

## 2022-01-09 DIAGNOSIS — M62830 Muscle spasm of back: Secondary | ICD-10-CM | POA: Diagnosis present

## 2022-01-09 DIAGNOSIS — M25551 Pain in right hip: Secondary | ICD-10-CM | POA: Insufficient documentation

## 2022-01-09 DIAGNOSIS — M545 Low back pain, unspecified: Secondary | ICD-10-CM | POA: Diagnosis present

## 2022-01-09 DIAGNOSIS — G8929 Other chronic pain: Secondary | ICD-10-CM | POA: Diagnosis present

## 2022-01-09 DIAGNOSIS — M542 Cervicalgia: Secondary | ICD-10-CM | POA: Diagnosis present

## 2022-01-09 DIAGNOSIS — M533 Sacrococcygeal disorders, not elsewhere classified: Secondary | ICD-10-CM | POA: Diagnosis present
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
## 2022-01-09 NOTE — Patient Instructions (Signed)
R foot only   Ankle strengthening on R with band band wrapped around outer R side of foot ballmound of R foot pressing onto band , L foot is placed on top of band hip width apart, with the ballmound ,  R hand holds the band 30 reps swinging R pinky toe out to the R  X 2x day   Green band  ___  Wear SIJ band  , esp at [employer]  ___  Walking with higher knees, land across ballbounds and less on outer edge of feet

## 2022-01-10 ENCOUNTER — Ambulatory Visit (INDEPENDENT_AMBULATORY_CARE_PROVIDER_SITE_OTHER): Payer: Commercial Managed Care - PPO | Admitting: Family Medicine

## 2022-01-10 ENCOUNTER — Encounter: Payer: Self-pay | Admitting: Family Medicine

## 2022-01-10 VITALS — BP 122/74 | HR 102 | Ht 69.0 in | Wt 200.0 lb

## 2022-01-10 DIAGNOSIS — M9908 Segmental and somatic dysfunction of rib cage: Secondary | ICD-10-CM

## 2022-01-10 DIAGNOSIS — M9902 Segmental and somatic dysfunction of thoracic region: Secondary | ICD-10-CM

## 2022-01-10 DIAGNOSIS — M9904 Segmental and somatic dysfunction of sacral region: Secondary | ICD-10-CM | POA: Diagnosis not present

## 2022-01-10 DIAGNOSIS — M9901 Segmental and somatic dysfunction of cervical region: Secondary | ICD-10-CM | POA: Diagnosis not present

## 2022-01-10 DIAGNOSIS — M24551 Contracture, right hip: Secondary | ICD-10-CM | POA: Diagnosis not present

## 2022-01-10 DIAGNOSIS — M9903 Segmental and somatic dysfunction of lumbar region: Secondary | ICD-10-CM | POA: Diagnosis not present

## 2022-01-10 NOTE — Patient Instructions (Signed)
Good to see you! Everything is going great Thigh compression sleeve PT to work on more hamstring

## 2022-01-10 NOTE — Assessment & Plan Note (Signed)
Continues to have the tightness noted.  Does have some degenerative disc disease as well.  The patient is pregnant and is unable to take any other medications.  Patient will continue to work on core strengthening.  I believe the patient should do relatively well though with conservative therapy at home.  Follow-up with me again in 4 to 6 weeks.

## 2022-01-19 ENCOUNTER — Encounter: Payer: Commercial Managed Care - PPO | Admitting: Physical Therapy
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
## 2022-01-23 ENCOUNTER — Ambulatory Visit: Payer: Commercial Managed Care - PPO | Attending: Certified Nurse Midwife

## 2022-01-23 DIAGNOSIS — M533 Sacrococcygeal disorders, not elsewhere classified: Secondary | ICD-10-CM | POA: Diagnosis present

## 2022-01-23 DIAGNOSIS — M25551 Pain in right hip: Secondary | ICD-10-CM | POA: Diagnosis present

## 2022-01-23 DIAGNOSIS — M545 Low back pain, unspecified: Secondary | ICD-10-CM | POA: Insufficient documentation

## 2022-01-23 DIAGNOSIS — M62830 Muscle spasm of back: Secondary | ICD-10-CM | POA: Insufficient documentation

## 2022-01-23 DIAGNOSIS — G8929 Other chronic pain: Secondary | ICD-10-CM | POA: Diagnosis present

## 2022-02-03 ENCOUNTER — Encounter: Payer: Self-pay | Admitting: Certified Nurse Midwife

## 2022-02-13 NOTE — Progress Notes (Signed)
Regina Gray [place] [address] [place] Phone: [phone] Subjective:   Regina Gray, am serving as a scribe for Dr. Hulan Saas.  I'm seeing this patient by the request  of:  Bacigalupo, Dionne Bucy, MD  CC: Back and neck pain follow-up  [id number]  Regina Gray is a 31 y.o. female coming in with complaint of back and neck pain. OMT 01/10/2022. Patient states doing well. Same per usual. No new issues.  Medications patient has been prescribed: None  Taking:         Reviewed prior external information including notes and imaging from previsou exam, outside providers and external EMR if available.   As well as notes that were available from care everywhere and other healthcare systems.  Past medical history, social, surgical and family history all reviewed in electronic medical record.  No pertanent information unless stated regarding to the chief complaint.   Past Medical History:  Diagnosis Date   Complication of anesthesia    woke up during surgery    Allergies  Allergen Reactions   Codeine    Hydromorphone     Other reaction(s): Vomiting   Oxycodone     Other reaction(s): Vomiting   Sulfa Antibiotics      Review of Systems:  No headache, visual changes, nausea, vomiting, diarrhea, constipation, dizziness, abdominal pain, skin rash, fevers, chills, night sweats, weight loss, swollen lymph nodes, body aches, joint swelling, chest pain, shortness of breath, mood changes. POSITIVE muscle aches  Objective  Blood pressure 110/68, pulse 98, height 5\' 9"  (1.753 m), weight 205 lb (93 kg), SpO2 97 %, currently breastfeeding.   General: No apparent distress alert and oriented x3 mood and affect normal, dressed appropriately.  HEENT: Pupils equal, extraocular movements intact  Respiratory: Patient's speak in full sentences and does not appear short of breath  Cardiovascular: No lower extremity edema, non tender, no  erythema  Gait MSK:  Back   Osteopathic findings  C2 flexed rotated and side bent right C7 flexed rotated and side bent left T3 extended rotated and side bent right inhaled rib T9 extended rotated and side bent left L2 flexed rotated and side bent right Sacrum right on right       Assessment and Plan:  Degeneration of intervertebral disc at L5-S1 level Known arthritic changes of the back.  Patient does have significant tightness still of the hip flexor on the right compared to left.  Discussed continuing to work on core stability and home exercises.  Discussed which activities to do and which ones to avoid.  Increase activity slowly otherwise.  The patient still being pregnant not taking any medication other than vitamins.  Follow-up with me again in 4 to 6 weeks    Nonallopathic problems  Decision today to treat with OMT was based on Physical Exam  After verbal consent patient was treated with HVLA, ME, FPR techniques in cervical, rib, thoracic, lumbar, and sacral  areas  Patient tolerated the procedure well with improvement in symptoms  Patient given exercises, stretches and lifestyle modifications  See medications in patient instructions if given  Patient will follow up in 4-8 weeks     The above documentation has been reviewed and is accurate and complete Lyndal Pulley, DO         Note: This dictation was prepared with Dragon dictation along with smaller phrase technology. Any transcriptional errors that result from this process are unintentional.

## 2022-02-14 ENCOUNTER — Ambulatory Visit (INDEPENDENT_AMBULATORY_CARE_PROVIDER_SITE_OTHER): Payer: Commercial Managed Care - PPO | Admitting: Family Medicine

## 2022-02-14 VITALS — BP 110/68 | HR 98 | Ht 69.0 in | Wt 205.0 lb

## 2022-02-14 DIAGNOSIS — M9904 Segmental and somatic dysfunction of sacral region: Secondary | ICD-10-CM | POA: Diagnosis not present

## 2022-02-14 DIAGNOSIS — M5137 Other intervertebral disc degeneration, lumbosacral region: Secondary | ICD-10-CM

## 2022-02-14 DIAGNOSIS — M9901 Segmental and somatic dysfunction of cervical region: Secondary | ICD-10-CM | POA: Diagnosis not present

## 2022-02-14 DIAGNOSIS — M9908 Segmental and somatic dysfunction of rib cage: Secondary | ICD-10-CM | POA: Diagnosis not present

## 2022-02-14 DIAGNOSIS — M24551 Contracture, right hip: Secondary | ICD-10-CM

## 2022-02-14 DIAGNOSIS — M9903 Segmental and somatic dysfunction of lumbar region: Secondary | ICD-10-CM | POA: Diagnosis not present

## 2022-02-14 DIAGNOSIS — M9902 Segmental and somatic dysfunction of thoracic region: Secondary | ICD-10-CM | POA: Diagnosis not present

## 2022-02-14 NOTE — Assessment & Plan Note (Signed)
Known arthritic changes of the back.  Patient does have significant tightness still of the hip flexor on the right compared to left.  Discussed continuing to work on core stability and home exercises.  Discussed which activities to do and which ones to avoid.  Increase activity slowly otherwise.  The patient still being pregnant not taking any medication other than vitamins.  Follow-up with me again in 4 to 6 weeks

## 2022-02-14 NOTE — Patient Instructions (Signed)
See me again in  

## 2022-03-24 NOTE — Progress Notes (Unsigned)
[name] [address] [place] [address] Phone: [phone] Subjective:   Bruce Donath, am serving as a scribe for Dr. Antoine Primas.  I'm seeing this patient by the request  of:  Bacigalupo, Marzella Schlein, MD  CC: Low back and neck pain follow-up  [id number]  Regina Gray is a 31 y.o. female coming in with complaint of back and neck pain. OMT 02/14/2022. Patient states that she has been feeling good. Notes pain in R hip after playing with her child. Has some sciatic nerve pain that radiates down hamstring on R side.   Medications patient has been prescribed: None  Taking:         Reviewed prior external information including notes and imaging from previsou exam, outside providers and external EMR if available.   As well as notes that were available from care everywhere and other healthcare systems.  Past medical history, social, surgical and family history all reviewed in electronic medical record.  No pertanent information unless stated regarding to the chief complaint.   Past Medical History:  Diagnosis Date   Complication of anesthesia    woke up during surgery    Allergies  Allergen Reactions   Codeine    Hydromorphone     Other reaction(s): Vomiting   Oxycodone     Other reaction(s): Vomiting   Sulfa Antibiotics      Review of Systems:  No headache, visual changes, nausea, vomiting, diarrhea, constipation, dizziness, abdominal pain, skin rash, fevers, chills, night sweats, weight loss, swollen lymph nodes, body aches, joint swelling, chest pain, shortness of breath, mood changes. POSITIVE muscle aches  Objective  Blood pressure 118/76, pulse 64, height 5\' 9"  (1.753 m), weight 202 lb (91.6 kg), SpO2 98 %, currently breastfeeding.   General: No apparent distress alert and oriented x3 mood and affect normal, dressed appropriately.  HEENT: Pupils equal, extraocular movements intact  Respiratory: Patient's  speak in full sentences and does not appear short of breath  Cardiovascular: No lower extremity edema, non tender, no erythema  Low back exam   Patient is gravid.  Patient is appropriate for dates at the moment.  Does have some mild increase in lordosis noted.  Mild tightness with FABER test bilaterally right greater than left.  Osteopathic findings  C5 flexed rotated and side bent right C6 flexed rotated and side bent left T3 extended rotated and side bent right inhaled rib T5 extended rotated and side bent left L2 flexed rotated and side bent right Sacrum right on right       Assessment and Plan:  Degeneration of intervertebral disc at L5-S1 level Degenerative disc disease of the lumbar spine unlikely to be contributing.  Some of it is the tightness of the hip flexor with patient being pregnant.  Is responding extremely well though to the osteopathic manipulation.  Patient knows if pain gets out of proportion to seek medical attention immediately.  Discussed continuing to work on core strengthening.  Follow-up with me again in 6 to 8 weeks    Nonallopathic problems  Decision today to treat with OMT was based on Physical Exam  After verbal consent patient was treated with HVLA, ME, FPR techniques in cervical, rib, thoracic, lumbar, and sacral  areas  Patient tolerated the procedure well with improvement in symptoms  Patient given exercises, stretches and lifestyle modifications  See medications in patient instructions if given  Patient will follow up in 4-8 weeks     The above  documentation has been reviewed and is accurate and complete Judi Saa, DO         Note: This dictation was prepared with Dragon dictation along with smaller phrase technology. Any transcriptional errors that result from this process are unintentional.

## 2022-03-28 ENCOUNTER — Ambulatory Visit (INDEPENDENT_AMBULATORY_CARE_PROVIDER_SITE_OTHER): Payer: Commercial Managed Care - PPO | Admitting: Family Medicine

## 2022-03-28 VITALS — BP 118/76 | HR 64 | Ht 69.0 in | Wt 202.0 lb

## 2022-03-28 DIAGNOSIS — M9908 Segmental and somatic dysfunction of rib cage: Secondary | ICD-10-CM | POA: Diagnosis not present

## 2022-03-28 DIAGNOSIS — M9902 Segmental and somatic dysfunction of thoracic region: Secondary | ICD-10-CM | POA: Diagnosis not present

## 2022-03-28 DIAGNOSIS — M9901 Segmental and somatic dysfunction of cervical region: Secondary | ICD-10-CM

## 2022-03-28 DIAGNOSIS — M5137 Other intervertebral disc degeneration, lumbosacral region: Secondary | ICD-10-CM

## 2022-03-28 DIAGNOSIS — M9904 Segmental and somatic dysfunction of sacral region: Secondary | ICD-10-CM | POA: Diagnosis not present

## 2022-03-28 DIAGNOSIS — M9903 Segmental and somatic dysfunction of lumbar region: Secondary | ICD-10-CM

## 2022-03-28 NOTE — Assessment & Plan Note (Signed)
Degenerative disc disease of the lumbar spine unlikely to be contributing.  Some of it is the tightness of the hip flexor with patient being pregnant.  Is responding extremely well though to the osteopathic manipulation.  Patient knows if pain gets out of proportion to seek medical attention immediately.  Discussed continuing to work on core strengthening.  Follow-up with me again in 6 to 8 weeks

## 2022-03-28 NOTE — Patient Instructions (Signed)
Good to see you   Follow up in  

## 2022-04-04 ENCOUNTER — Encounter: Payer: Commercial Managed Care - PPO | Admitting: Certified Nurse Midwife

## 2022-04-21 NOTE — Progress Notes (Signed)
  [name] [address] [place] [address] Phone: [phone] Subjective:   Bruce Donath, am serving as a scribe for Dr. Antoine Primas.  I'm seeing this patient by the request  of:  Debera Lat, PA-C  CC: Low back pain follow-up  [id number]  Regina Gray is a 31 y.o. female coming in with complaint of back and neck pain. OMT 03/28/2022. Patient states that she continues to have some discomfort and pain.  Feels like she is getting more discomfort in the pelvic area.  Patient understands that with the being nearly [redacted] weeks pregnant that this is relatively normal.  Denies anything that is stopping her from activity at this moment.  Medications patient has been prescribed: None  Taking:          Past Medical History:  Diagnosis Date   Complication of anesthesia    woke up during surgery    Allergies  Allergen Reactions   Codeine    Hydromorphone     Other reaction(s): Vomiting   Oxycodone     Other reaction(s): Vomiting   Sulfa Antibiotics      Objective  Blood pressure 112/76, pulse (!) 107, height 5\' 9"  (1.753 m), weight 207 lb (93.9 kg), SpO2 99 %, currently breastfeeding.   General: No apparent distress alert and oriented x3 mood and affect normal, dressed appropriately.  HEENT: Pupils equal, extraocular movements intact  Respiratory: Patient's speak in full sentences and does not appear short of breath  Cardiovascular: No lower extremity edema, non tender, no erythema  Low back does have some increase in lordosis.  Patient is gravid.  Patient does have tenderness to palpation in the paraspinal musculature mostly L5-S1 as well as in the thoracolumbar juncture.   Osteopathic findings  C6 flexed rotated and side bent right T3 extended rotated and side bent right inhaled rib L2 flexed rotated and side bent right L5 flexed rotated and side bent left Sacrum right on right       Assessment and  Plan:  Degeneration of intervertebral disc at L5-S1 level Patient having increasing stress in the lower back secondary to the increasing in size of the child discussed with patient icing regimen and home exercises, which activities to do and which ones to avoid.  Follow-up again in 6 to 8 weeks.    Nonallopathic problems  Decision today to treat with OMT was based on Physical Exam  After verbal consent patient was treated with HVLA, ME, FPR techniques in cervical, rib, thoracic, lumbar, and sacral  areas  Patient tolerated the procedure well with improvement in symptoms  Patient given exercises, stretches and lifestyle modifications  See medications in patient instructions if given  Patient will follow up in 4-8 weeks    The above documentation has been reviewed and is accurate and complete Judi Saa, DO          Note: This dictation was prepared with Dragon dictation along with smaller phrase technology. Any transcriptional errors that result from this process are unintentional.

## 2022-04-27 ENCOUNTER — Ambulatory Visit (INDEPENDENT_AMBULATORY_CARE_PROVIDER_SITE_OTHER): Payer: Commercial Managed Care - PPO | Admitting: Family Medicine

## 2022-04-27 VITALS — BP 112/76 | HR 107 | Ht 69.0 in | Wt 207.0 lb

## 2022-04-27 DIAGNOSIS — M9901 Segmental and somatic dysfunction of cervical region: Secondary | ICD-10-CM | POA: Diagnosis not present

## 2022-04-27 DIAGNOSIS — M9904 Segmental and somatic dysfunction of sacral region: Secondary | ICD-10-CM

## 2022-04-27 DIAGNOSIS — M9903 Segmental and somatic dysfunction of lumbar region: Secondary | ICD-10-CM

## 2022-04-27 DIAGNOSIS — M5137 Other intervertebral disc degeneration, lumbosacral region: Secondary | ICD-10-CM | POA: Diagnosis not present

## 2022-04-27 DIAGNOSIS — M9902 Segmental and somatic dysfunction of thoracic region: Secondary | ICD-10-CM | POA: Diagnosis not present

## 2022-04-27 DIAGNOSIS — M9908 Segmental and somatic dysfunction of rib cage: Secondary | ICD-10-CM

## 2022-04-27 NOTE — Assessment & Plan Note (Addendum)
Patient having increasing stress in the lower back secondary to the increasing in size of the child discussed with patient icing regimen and home exercises, which activities to do and which ones to avoid.  Follow-up again in 6 weeks postpartum.

## 2022-04-27 NOTE — Patient Instructions (Signed)
Thanks for bringing the body guard See me 6 weeks post partum

## 2022-06-27 NOTE — Progress Notes (Unsigned)
[facility] [place] Phone: [phone] Subjective:   Regina Gray, am serving as a scribe for Dr. Hulan Saas.  I'm seeing this patient by the request  of:  Mardene Speak, PA-C  CC: Back and neck pain  [id number]  Regina Gray is a 32 y.o. female coming in with complaint of back and neck pain. OMT 04/27/2022. Patient states that her back feels fine but the pain prior to pregnancy and during pregnancy is not longer bothering her now.   Medications patient has been prescribed: None  Taking:         Reviewed prior external information including notes and imaging from previsou exam, outside providers and external EMR if available.   As well as notes that were available from care everywhere and other healthcare systems.  Past medical history, social, surgical and family history all reviewed in electronic medical record.  No pertanent information unless stated regarding to the chief complaint.   Past Medical History:  Diagnosis Date   Complication of anesthesia    woke up during surgery    Allergies  Allergen Reactions   Codeine    Hydromorphone     Other reaction(s): Vomiting   Oxycodone     Other reaction(s): Vomiting   Sulfa Antibiotics      Review of Systems:  No headache, visual changes, nausea, vomiting, diarrhea, constipation, dizziness, abdominal pain, skin rash, fevers, chills, night sweats, weight loss, swollen lymph nodes, body aches, joint swelling, chest pain, shortness of breath, mood changes. POSITIVE muscle aches  Objective  Blood pressure 122/82, height '5\' 9"'$  (1.753 m), weight 185 lb (83.9 kg), currently breastfeeding.   General: No apparent distress alert and oriented x3 mood and affect normal, dressed appropriately.  HEENT: Pupils equal, extraocular movements intact  Respiratory: Patient's speak in full sentences and does not appear short of breath  Cardiovascular: No  lower extremity edema, non tender, no erythema  MSK:  Back does have some mild loss of lordosis.  Still has tightness of the hip flexor on the right compared to left.  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  Osteopathic findings  C6 flexed rotated and side bent left T3 extended rotated and side bent right inhaled rib T9 extended rotated and side bent left L2 flexed rotated and side bent right L4 flexed rotated and side bent right Sacrum right on right       Assessment and Plan:  Hip flexor tightness, right Patient does have some underlying degenerative disc disease but overall since she is no longer been pregnant she seems to be making more strides.  Discussed with patient about icing regimen and home exercises, which activities to do and which ones to avoid.  Increase activity slowly.  Follow-up again in 8-12 weeks    Nonallopathic problems  Decision today to treat with OMT was based on Physical Exam  After verbal consent patient was treated with HVLA, ME, FPR techniques in cervical, rib, thoracic, lumbar, and sacral  areas  Patient tolerated the procedure well with improvement in symptoms  Patient given exercises, stretches and lifestyle modifications  See medications in patient instructions if given  Patient will follow up in 4-8 weeks     The above documentation has been reviewed and is accurate and complete Lyndal Pulley, DO         Note: This dictation was prepared with Dragon dictation along with smaller phrase technology.  Any transcriptional errors that result from this process are unintentional.

## 2022-06-28 ENCOUNTER — Ambulatory Visit (INDEPENDENT_AMBULATORY_CARE_PROVIDER_SITE_OTHER): Payer: Commercial Managed Care - PPO | Admitting: Family Medicine

## 2022-06-28 VITALS — BP 122/82 | Ht 69.0 in | Wt 185.0 lb

## 2022-06-28 DIAGNOSIS — M9902 Segmental and somatic dysfunction of thoracic region: Secondary | ICD-10-CM | POA: Diagnosis not present

## 2022-06-28 DIAGNOSIS — M9904 Segmental and somatic dysfunction of sacral region: Secondary | ICD-10-CM

## 2022-06-28 DIAGNOSIS — M24551 Contracture, right hip: Secondary | ICD-10-CM

## 2022-06-28 DIAGNOSIS — M9903 Segmental and somatic dysfunction of lumbar region: Secondary | ICD-10-CM | POA: Diagnosis not present

## 2022-06-28 DIAGNOSIS — M9908 Segmental and somatic dysfunction of rib cage: Secondary | ICD-10-CM | POA: Diagnosis not present

## 2022-06-28 DIAGNOSIS — M9901 Segmental and somatic dysfunction of cervical region: Secondary | ICD-10-CM

## 2022-06-28 NOTE — Assessment & Plan Note (Signed)
Patient does have some underlying degenerative disc disease but overall since she is no longer been pregnant she seems to be making more strides.  Discussed with patient about icing regimen and home exercises, which activities to do and which ones to avoid.  Increase activity slowly.  Follow-up again in 8-12 weeks

## 2022-06-28 NOTE — Patient Instructions (Signed)
Good to see you   Follow up in

## 2022-09-12 NOTE — Progress Notes (Signed)
[name] [address] [place] [address] Phone: [phone] Subjective:   Regina Gray, am serving as a scribe for Dr. Antoine Primas.  I'm seeing this patient by the request  of:  Debera Lat, PA-C  CC: Low back pain follow-up  [id number]  Regina Gray is a 32 y.o. female coming in with complaint of back and neck pain. OMT 06/28/2022. Patient states that if she moves certain ways she will have a dead leg. Back is also feeling much tighter than usual. Patient was doing well last visit but notes 3 days after her visit she had increase in tightness. Two days later she realized she had the flu. Patient notes similar symptoms when she had covid. Notes when she gets sick she seems to have tighter muscles in her hip and back.   Medications patient has been prescribed: None  Taking:         Reviewed prior external information including notes and imaging from previsou exam, outside providers and external EMR if available.   As well as notes that were available from care everywhere and other healthcare systems.  Past medical history, social, surgical and family history all reviewed in electronic medical record.  No pertanent information unless stated regarding to the chief complaint.   Past Medical History:  Diagnosis Date   Complication of anesthesia    woke up during surgery    Allergies  Allergen Reactions   Codeine    Hydromorphone     Other reaction(s): Vomiting   Oxycodone     Other reaction(s): Vomiting   Sulfa Antibiotics      Review of Systems:  No headache, visual changes, nausea, vomiting, diarrhea, constipation, dizziness, abdominal pain, skin rash, fevers, chills, night sweats, weight loss, swollen lymph nodes, body aches, joint swelling, chest pain, shortness of breath, mood changes. POSITIVE muscle aches  Objective  Blood pressure 122/84, pulse 76, height 5\' 9"  (1.753 m), weight 178 lb (80.7 kg), SpO2 97  %, currently breastfeeding.   General: No apparent distress alert and oriented x3 mood and affect normal, dressed appropriately.  HEENT: Pupils equal, extraocular movements intact  Respiratory: Patient's speak in full sentences and does not appear short of breath  Cardiovascular: No lower extremity edema, non tender, no erythema  Normal gait, patient does have tightness noted on the hips but seems to actually have tightness at the right hip flexor but also tightness in the paraspinal musculature of the left sacroiliac as well.  Osteopathic findings  C3 flexed rotated and side bent right C7 flexed rotated and side bent left T3 extended rotated and side bent right inhaled rib T9 extended rotated and side bent left L1 flexed rotated and side bent right L4 flexed rotated and side bent left Sacrum right on right       Assessment and Plan:  Hip flexor tightness, right Continue to monitor the tightness.  Still secondary to likely some weight breast-feeding in the progesterone levels.  Condition of this continues to have difficulty with the children and picking them up.  Discussed which activities to do and which ones to avoid.  Increase activity slowly as the next several weeks.  Follow-up again in 6 to 8 weeks.    Nonallopathic problems  Decision today to treat with OMT was based on Physical Exam  After verbal consent patient was treated with HVLA, ME, FPR techniques in cervical, rib, thoracic, lumbar, and sacral  areas  Patient tolerated the procedure well with  improvement in symptoms  Patient given exercises, stretches and lifestyle modifications  See medications in patient instructions if given  Patient will follow up in 4-8 weeks     The above documentation has been reviewed and is accurate and complete Judi Saa, DO         Note: This dictation was prepared with Dragon dictation along with smaller phrase technology. Any transcriptional errors that result from  this process are unintentional.

## 2022-09-13 ENCOUNTER — Encounter: Payer: Self-pay | Admitting: Family Medicine

## 2022-09-13 ENCOUNTER — Ambulatory Visit (INDEPENDENT_AMBULATORY_CARE_PROVIDER_SITE_OTHER): Payer: Commercial Managed Care - PPO | Admitting: Family Medicine

## 2022-09-13 VITALS — BP 122/84 | HR 76 | Ht 69.0 in | Wt 178.0 lb

## 2022-09-13 DIAGNOSIS — M9908 Segmental and somatic dysfunction of rib cage: Secondary | ICD-10-CM

## 2022-09-13 DIAGNOSIS — M9902 Segmental and somatic dysfunction of thoracic region: Secondary | ICD-10-CM

## 2022-09-13 DIAGNOSIS — M9901 Segmental and somatic dysfunction of cervical region: Secondary | ICD-10-CM | POA: Diagnosis not present

## 2022-09-13 DIAGNOSIS — M9903 Segmental and somatic dysfunction of lumbar region: Secondary | ICD-10-CM | POA: Diagnosis not present

## 2022-09-13 DIAGNOSIS — M24551 Contracture, right hip: Secondary | ICD-10-CM | POA: Diagnosis not present

## 2022-09-13 DIAGNOSIS — M9904 Segmental and somatic dysfunction of sacral region: Secondary | ICD-10-CM

## 2022-09-13 NOTE — Patient Instructions (Signed)
Keep dong better and better There's nothing you can't do See you again in 3 months

## 2022-09-13 NOTE — Assessment & Plan Note (Signed)
Continue to monitor the tightness.  Still secondary to likely some weight breast-feeding in the progesterone levels.  Condition of this continues to have difficulty with the children and picking them up.  Discussed which activities to do and which ones to avoid.  Increase activity slowly as the next several weeks.  Follow-up again in 6 to 8 weeks.

## 2022-11-14 ENCOUNTER — Encounter: Payer: Self-pay | Admitting: Family Medicine

## 2022-12-20 NOTE — Progress Notes (Signed)
[name] [address] [place] [address] Phone: [phone] Subjective:   Bruce Donath, am serving as a scribe for Dr. Antoine Primas.  I'm seeing this patient by the request  of:  Debera Lat, PA-C  CC: back and neck pain   [id number]  Regina Gray is a 32 y.o. female coming in with complaint of back and neck pain. OMT 09/13/2022. Patient states that she feels like she pulled her back over July 4th weekend. Noticed a pop when she went to the pool. Drove a few weeks ago to [place] and she developed some back spasms. Unable to straighten up due to pain the following day. Pain and tightness radiated down the legs. Pain from vacation lasted 2 weeks and this week she feels like she is 95% better. Pain worse in R hip than L. Using heat throughout the day.   Medications patient has been prescribed: None  Taking:         Reviewed prior external information including notes and imaging from previsou exam, outside providers and external EMR if available.   As well as notes that were available from care everywhere and other healthcare systems.  Past medical history, social, surgical and family history all reviewed in electronic medical record.  No pertanent information unless stated regarding to the chief complaint.   Past Medical History:  Diagnosis Date   Complication of anesthesia    woke up during surgery    Allergies  Allergen Reactions   Codeine    Hydromorphone     Other reaction(s): Vomiting   Oxycodone     Other reaction(s): Vomiting   Sulfa Antibiotics      Review of Systems:  No headache, visual changes, nausea, vomiting, diarrhea, constipation, dizziness, abdominal pain, skin rash, fevers, chills, night sweats, weight loss, swollen lymph nodes, body aches, joint swelling, chest pain, shortness of breath, mood changes. POSITIVE muscle aches  Objective  Blood pressure 106/82, pulse 66, height 5\' 9"  (1.753 m),  weight 179 lb (81.2 kg), SpO2 97%, currently breastfeeding.   General: No apparent distress alert and oriented x3 mood and affect normal, dressed appropriately.  HEENT: Pupils equal, extraocular movements intact  Respiratory: Patient's speak in full sentences and does not appear short of breath  Cardiovascular: No lower extremity edema, non tender, no erythema  Back exam does have some loss lordosis noted.  Some tenderness to palpation diffusely but seems to be worse left greater than right especially over the sacroiliac joint.  Osteopathic findings  C5 flexed rotated and side bent left T3 extended rotated and side bent right inhaled rib T8 extended rotated and side bent left L1 flexed rotated and side bent right Sacrum left on left        Assessment and Plan:  Degeneration of intervertebral disc at L5-S1 level Patient did have an exacerbation and does have pain that does seem to be out of proportion.  Sounds like patient did have potentially a very severe muscle spasm or even a potential for a herniated disc and now seems to be somewhat better.  Patient is going to do osteopathic manipulation and did respond well to it.  Discussed posture and ergonomics and hip abductor strengthening.  Discussed avoiding certain activities.  Follow-up again in 6 to 8 weeks otherwise.    Nonallopathic problems  Decision today to treat with OMT was based on Physical Exam  After verbal consent patient was treated with HVLA, ME, FPR techniques in cervical, rib,  thoracic, lumbar, and sacral  areas  Patient tolerated the procedure well with improvement in symptoms  Patient given exercises, stretches and lifestyle modifications  See medications in patient instructions if given  Patient will follow up in 4-8 weeks     The above documentation has been reviewed and is accurate and complete Judi Saa, DO         Note: This dictation was prepared with Dragon dictation along with smaller  phrase technology. Any transcriptional errors that result from this process are unintentional.

## 2022-12-27 ENCOUNTER — Encounter: Payer: Self-pay | Admitting: Family Medicine

## 2022-12-27 ENCOUNTER — Ambulatory Visit (INDEPENDENT_AMBULATORY_CARE_PROVIDER_SITE_OTHER): Payer: Commercial Managed Care - PPO | Admitting: Family Medicine

## 2022-12-27 VITALS — BP 106/82 | HR 66 | Ht 69.0 in | Wt 179.0 lb

## 2022-12-27 DIAGNOSIS — M9908 Segmental and somatic dysfunction of rib cage: Secondary | ICD-10-CM | POA: Diagnosis not present

## 2022-12-27 DIAGNOSIS — M9903 Segmental and somatic dysfunction of lumbar region: Secondary | ICD-10-CM | POA: Diagnosis not present

## 2022-12-27 DIAGNOSIS — M5137 Other intervertebral disc degeneration, lumbosacral region: Secondary | ICD-10-CM | POA: Diagnosis not present

## 2022-12-27 DIAGNOSIS — M9901 Segmental and somatic dysfunction of cervical region: Secondary | ICD-10-CM | POA: Diagnosis not present

## 2022-12-27 DIAGNOSIS — M9902 Segmental and somatic dysfunction of thoracic region: Secondary | ICD-10-CM | POA: Diagnosis not present

## 2022-12-27 DIAGNOSIS — M9904 Segmental and somatic dysfunction of sacral region: Secondary | ICD-10-CM | POA: Diagnosis not present

## 2022-12-27 NOTE — Patient Instructions (Signed)
Hip abductor strength Sleep with pillow between knees See me again in 4-6 weeks

## 2022-12-27 NOTE — Assessment & Plan Note (Signed)
Patient did have an exacerbation and does have pain that does seem to be out of proportion.  Sounds like patient did have potentially a very severe muscle spasm or even a potential for a herniated disc and now seems to be somewhat better.  Patient is going to do osteopathic manipulation and did respond well to it.  Discussed posture and ergonomics and hip abductor strengthening.  Discussed avoiding certain activities.  Follow-up again in 6 to 8 weeks otherwise.

## 2023-01-26 NOTE — Progress Notes (Unsigned)
[name] [address] [place] [address] Phone: [phone] Subjective:   INadine Counts, am serving as a scribe for Dr. Antoine Primas.  I'm seeing this patient by the request  of:  Ostwalt, Edmon Crape, PA-C  CC: Low back and neck pain follow-up  [id number]  Regina Gray is a 32 y.o. female coming in with complaint of back and neck pain. OMT 12/27/2022. Patient states same per usual. Is feeling weakness in her left leg. The chills she was having in that leg before have ceased.  Medications patient has been prescribed: None  Taking:         Reviewed prior external information including notes and imaging from previsou exam, outside providers and external EMR if available.   As well as notes that were available from care everywhere and other healthcare systems.  Past medical history, social, surgical and family history all reviewed in electronic medical record.  No pertanent information unless stated regarding to the chief complaint.   Past Medical History:  Diagnosis Date   Complication of anesthesia    woke up during surgery    Allergies  Allergen Reactions   Codeine    Hydromorphone     Other reaction(s): Vomiting   Oxycodone     Other reaction(s): Vomiting   Sulfa Antibiotics      Review of Systems:  No headache, visual changes, nausea, vomiting, diarrhea, constipation, dizziness, abdominal pain, skin rash, fevers, chills, night sweats, weight loss, swollen lymph nodes, body aches, joint swelling, chest pain, shortness of breath, mood changes. POSITIVE muscle aches  Objective  Blood pressure 120/80, pulse 81, height 5\' 9"  (1.753 m), weight 182 lb (82.6 kg), SpO2 99%, currently breastfeeding.   General: No apparent distress alert and oriented x3 mood and affect normal, dressed appropriately.  HEENT: Pupils equal, extraocular movements intact  Respiratory: Patient's speak in full sentences and does not appear short of  breath  Cardiovascular: No lower extremity edema, non tender, no erythema  Back exam does have some loss lordosis noted.  Some tenderness to palpation of the paraspinal musculature but does seem to be left greater than right.  Patient seems uneasy on the left side leg at the moment.  Good strength and deep tendon reflexes though are intact  Osteopathic findings  C6 flexed rotated and side bent left T9 extended rotated and side bent left inhaled rib L2 flexed rotated and side bent right L5 flexed rotated and side bent left Sacrum right on right       Assessment and Plan:  Hip flexor tightness, right Continues to have tightness though noted.  Discussed with patient icing regimen and home exercises, discussed which activities to do and which ones to avoid otherwise.  Increasing activity slowly over the course of next several weeks.  Discussed icing regimen.  Follow-up with me again in 6 to 8 weeks.  Worsening weakness we can consider advanced imaging again but I did recommend potentially trying the epidural first with patient not going through with this.    Nonallopathic problems  Decision today to treat with OMT was based on Physical Exam  After verbal consent patient was treated with HVLA, ME, FPR techniques in cervical, rib, thoracic, lumbar, and sacral  areas  Patient tolerated the procedure well with improvement in symptoms  Patient given exercises, stretches and lifestyle modifications  See medications in patient instructions if given  Patient will follow up in 4-8 weeks     The above documentation  has been reviewed and is accurate and complete Judi Saa, DO         Note: This dictation was prepared with Dragon dictation along with smaller phrase technology. Any transcriptional errors that result from this process are unintentional.

## 2023-01-29 ENCOUNTER — Encounter: Payer: Self-pay | Admitting: Family Medicine

## 2023-01-29 ENCOUNTER — Ambulatory Visit (INDEPENDENT_AMBULATORY_CARE_PROVIDER_SITE_OTHER): Payer: Commercial Managed Care - PPO | Admitting: Family Medicine

## 2023-01-29 VITALS — BP 120/80 | HR 81 | Ht 69.0 in | Wt 182.0 lb

## 2023-01-29 DIAGNOSIS — M9904 Segmental and somatic dysfunction of sacral region: Secondary | ICD-10-CM | POA: Diagnosis not present

## 2023-01-29 DIAGNOSIS — M24551 Contracture, right hip: Secondary | ICD-10-CM

## 2023-01-29 DIAGNOSIS — M9902 Segmental and somatic dysfunction of thoracic region: Secondary | ICD-10-CM

## 2023-01-29 DIAGNOSIS — M9901 Segmental and somatic dysfunction of cervical region: Secondary | ICD-10-CM

## 2023-01-29 DIAGNOSIS — M9903 Segmental and somatic dysfunction of lumbar region: Secondary | ICD-10-CM | POA: Diagnosis not present

## 2023-01-29 DIAGNOSIS — M9908 Segmental and somatic dysfunction of rib cage: Secondary | ICD-10-CM

## 2023-01-29 NOTE — Patient Instructions (Signed)
Do prescribed exercises at least 3x a week Consider epidural but you're going to be okay See you again in 6-8 weeks

## 2023-01-29 NOTE — Assessment & Plan Note (Signed)
Continues to have tightness though noted.  Discussed with patient icing regimen and home exercises, discussed which activities to do and which ones to avoid otherwise.  Increasing activity slowly over the course of next several weeks.  Discussed icing regimen.  Follow-up with me again in 6 to 8 weeks.  Worsening weakness we can consider advanced imaging again but I did recommend potentially trying the epidural first with patient not going through with this.

## 2023-02-16 ENCOUNTER — Encounter: Payer: Self-pay | Admitting: Family Medicine

## 2023-02-20 ENCOUNTER — Ambulatory Visit (INDEPENDENT_AMBULATORY_CARE_PROVIDER_SITE_OTHER): Payer: Commercial Managed Care - PPO | Admitting: Family Medicine

## 2023-02-20 ENCOUNTER — Encounter: Payer: Self-pay | Admitting: Family Medicine

## 2023-02-20 VITALS — BP 122/88 | HR 75 | Ht 69.0 in

## 2023-02-20 DIAGNOSIS — G8929 Other chronic pain: Secondary | ICD-10-CM | POA: Insufficient documentation

## 2023-02-20 DIAGNOSIS — M533 Sacrococcygeal disorders, not elsewhere classified: Secondary | ICD-10-CM | POA: Diagnosis not present

## 2023-02-20 NOTE — Progress Notes (Signed)
[name] [address] [place] [address] Phone: [phone] Subjective:   Bruce Donath, am serving as a scribe for Dr. Antoine Primas.  I'm seeing this patient by the request  of:  Debera Lat, PA-C  CC: Low back pain follow-up  [id number]  Regina Gray is a 32 y.o. female coming in with complaint of back and neck pain. OMT 01/29/2023.  Patient states that she had increase in pain in L side of lumbar spine. Pain in the L SI joint that radiates around front of body. Wonders if her pain is related to when she gets sick as she recently had a stomach bug.   Medications patient has been prescribed:   Taking:         Reviewed prior external information including notes and imaging from previsou exam, outside providers and external EMR if available.   As well as notes that were available from care everywhere and other healthcare systems.  Past medical history, social, surgical and family history all reviewed in electronic medical record.  No pertanent information unless stated regarding to the chief complaint.   Past Medical History:  Diagnosis Date   Complication of anesthesia    woke up during surgery    Allergies  Allergen Reactions   Codeine    Hydromorphone     Other reaction(s): Vomiting   Oxycodone     Other reaction(s): Vomiting   Sulfa Antibiotics      Review of Systems:  No headache, visual changes, nausea, vomiting, diarrhea, constipation, dizziness, abdominal pain, skin rash, fevers, chills, night sweats, weight loss, swollen lymph nodes, body aches, joint swelling, chest pain, shortness of breath, mood changes. POSITIVE muscle aches  Objective  Blood pressure 122/88, pulse 75, height 5\' 9"  (1.753 m), SpO2 97%, currently breastfeeding.   General: No apparent distress alert and oriented x3 mood and affect normal, dressed appropriately.  HEENT: Pupils equal, extraocular movements intact  Respiratory:  Patient's speak in full sentences and does not appear short of breath  Cardiovascular: No lower extremity edema, non tender, no erythema  Low back exam does have some loss of lordosis noted.  Some tenderness to palpation in the paraspinal musculature.  Seems to be right over the left sacroiliac joint  After verbal consent patient was prepped with alcohol swab and with a 21-gauge 2 inch needle injected into the left sacroiliac joint with a total of 1 cc of 0.5% Marcaine and 1 cc of Kenalog 40 mg/mL.  No blood loss.  Band-Aid placed.  Postinjection instructions given.       Assessment and Plan:  Chronic left SI joint pain Patient given injection today and tolerated the procedure well, discussed icing regimen and home exercises, which activities to do and which ones to avoid.  Hopefully this will make some improvement.  Does have degenerative disc disease at the L5-S1 area and may need to consider an epidural in this area.  Patient wants to avoid to any medications with her continuing to breast-feed at this time.  Follow-up with me again 6 to 8 weeks to restart osteopathic manipulation if it is beneficial.  We also discussed with this being on the contralateral side where patient had difficulty does advance imaging would be warranted again.        The above documentation has been reviewed and is accurate and complete Judi Saa, DO         Note: This dictation was prepared with Dragon dictation along  with smaller phrase technology. Any transcriptional errors that result from this process are unintentional.

## 2023-02-20 NOTE — Assessment & Plan Note (Signed)
Patient given injection today and tolerated the procedure well, discussed icing regimen and home exercises, which activities to do and which ones to avoid.  Hopefully this will make some improvement.  Does have degenerative disc disease at the L5-S1 area and may need to consider an epidural in this area.  Patient wants to avoid to any medications with her continuing to breast-feed at this time.  Follow-up with me again 6 to 8 weeks to restart osteopathic manipulation if it is beneficial.  We also discussed with this being on the contralateral side where patient had difficulty does advance imaging would be warranted again.

## 2023-02-20 NOTE — Patient Instructions (Signed)
Good to see you  Send me another message in two weeks Otherwise start PT in about 6 weeks  Injection given today

## 2023-02-23 ENCOUNTER — Encounter: Payer: Self-pay | Admitting: Family Medicine

## 2023-03-09 MED ORDER — PREDNISONE 20 MG PO TABS: 40.0000 mg | ORAL_TABLET | Freq: Every day | ORAL | 0 refills | Status: DC

## 2023-03-10 ENCOUNTER — Emergency Department: Payer: Commercial Managed Care - PPO

## 2023-03-10 ENCOUNTER — Other Ambulatory Visit: Payer: Self-pay

## 2023-03-10 ENCOUNTER — Emergency Department
Admission: EM | Admit: 2023-03-10 | Discharge: 2023-03-10 | Disposition: A | Discharge status: Home / Self Care | Payer: Commercial Managed Care - PPO | Attending: Student in an Organized Health Care Education/Training Program | Admitting: Student in an Organized Health Care Education/Training Program

## 2023-03-10 DIAGNOSIS — M5126 Other intervertebral disc displacement, lumbar region: Secondary | ICD-10-CM | POA: Insufficient documentation

## 2023-03-10 DIAGNOSIS — M549 Dorsalgia, unspecified: Secondary | ICD-10-CM | POA: Diagnosis present

## 2023-03-10 DIAGNOSIS — M5441 Lumbago with sciatica, right side: Secondary | ICD-10-CM | POA: Diagnosis not present

## 2023-03-10 DIAGNOSIS — M5442 Lumbago with sciatica, left side: Secondary | ICD-10-CM | POA: Insufficient documentation

## 2023-03-10 LAB — POC URINE PREG, ED: Preg Test, Ur: NEGATIVE

## 2023-03-10 MED ORDER — ONDANSETRON 4 MG PO TBDP: 4.0000 mg | ORAL_TABLET | Freq: Three times a day (TID) | ORAL | 0 refills | Status: AC | PRN

## 2023-03-10 MED ORDER — METHOCARBAMOL 500 MG PO TABS: 500.0000 mg | ORAL_TABLET | Freq: Four times a day (QID) | ORAL | 1 refills | Status: AC

## 2023-03-10 MED ORDER — KETOROLAC TROMETHAMINE 30 MG/ML IJ SOLN
30.0000 mg | Freq: Once | INTRAMUSCULAR | Status: AC
  Administered 2023-03-10: 30 mg via INTRAMUSCULAR
  Filled 2023-03-10: qty 1

## 2023-03-10 MED ORDER — HYDROCODONE-ACETAMINOPHEN 5-325 MG PO TABS
1.0000 | ORAL_TABLET | Freq: Once | ORAL | Status: AC
  Administered 2023-03-10: 1 via ORAL
  Filled 2023-03-10: qty 1

## 2023-03-10 MED ORDER — DIAZEPAM 2 MG PO TABS
2.0000 mg | ORAL_TABLET | Freq: Once | ORAL | Status: AC
  Administered 2023-03-10: 2 mg via ORAL
  Filled 2023-03-10: qty 1

## 2023-03-10 MED ORDER — HYDROCODONE-ACETAMINOPHEN 5-325 MG PO TABS: 1.0000 | ORAL_TABLET | ORAL | 0 refills | Status: AC | PRN

## 2023-03-10 MED ORDER — PREDNISONE 50 MG PO TABS: 50.0000 mg | ORAL_TABLET | Freq: Every day | ORAL | 0 refills | Status: AC

## 2023-03-10 MED ORDER — ONDANSETRON 8 MG PO TBDP
8.0000 mg | ORAL_TABLET | Freq: Once | ORAL | Status: AC
  Administered 2023-03-10: 8 mg via ORAL
  Filled 2023-03-10: qty 1

## 2023-03-10 MED ORDER — HYDROCODONE-ACETAMINOPHEN 5-325 MG PO TABS
1.0000 | ORAL_TABLET | Freq: Once | ORAL | Status: AC
Start: 2023-03-10 — End: 2023-03-10
  Administered 2023-03-10: 1 via ORAL
  Filled 2023-03-10: qty 1

## 2023-03-10 MED ORDER — MELOXICAM 15 MG PO TABS: 15.0000 mg | ORAL_TABLET | Freq: Every day | ORAL | 0 refills | Status: AC

## 2023-03-10 MED ORDER — METHOCARBAMOL 500 MG PO TABS
1000.0000 mg | ORAL_TABLET | Freq: Once | ORAL | Status: AC
  Administered 2023-03-10: 1000 mg via ORAL
  Filled 2023-03-10: qty 2

## 2023-03-10 MED ORDER — DEXAMETHASONE SODIUM PHOSPHATE 10 MG/ML IJ SOLN
10.0000 mg | Freq: Once | INTRAMUSCULAR | Status: AC
Start: 2023-03-10 — End: 2023-03-10
  Administered 2023-03-10: 10 mg via INTRAMUSCULAR
  Filled 2023-03-10: qty 1

## 2023-03-10 NOTE — ED Triage Notes (Addendum)
Pt to ED via POV from home. Pt reports left lower back pain that radiates to groin. Pt denies urinary symptoms. Pt has been seeing orthopedic doctor and has not had new imaging since. Pt reports 4wks ago with similar episode and was given a steroid shot with relief.   Blood and urine sent if needed

## 2023-03-10 NOTE — ED Provider Notes (Signed)
[facility] Provider Note  Patient Contact: 10:56 PM (approximate)   History   Back Pain   HPI  Regina Gray is a 32 y.o. female who presents the emergency department complaining of back pain.  Patient has a history of back issues for the last 2 to 3 years.  Patient is being followed by sports medicine in [place].  She states that roughly in July she had a twisting injury and felt a sharp pain in her back.  Since then she has been having increased back pain.  Is primarily affecting the left side of the back extending into the left leg.  There is no bowel or bladder function, saddle anesthesia or paresthesias.  Patient has had no new trauma but now is experiencing similar symptoms on the right side.  Patient states that initially she had an SI joint injection which has not improved symptoms.  She states that her orthopedist recommends an epidural injection but has not performed any imaging in the last 2 years.  Patient is wondering whether there is an alternative such as physical therapy, oral medications.     Physical Exam   Triage Vital Signs: ED Triage Vitals  Encounter Vitals Group     BP 03/10/23 1555 (!) 134/91     Systolic BP Percentile --      Diastolic BP Percentile --      Pulse Rate 03/10/23 1555 99     Resp 03/10/23 1555 20     Temp 03/10/23 1555 98.2 F (36.8 C)     Temp Source 03/10/23 1555 Oral     SpO2 03/10/23 1555 98 %     Weight --      Height --      Head Circumference --      Peak Flow --      Pain Score 03/10/23 1552 9     Pain Loc --      Pain Education --      Exclude from Growth Chart --     Most recent vital signs: Vitals:   03/10/23 1555  BP: (!) 134/91  Pulse: 99  Resp: 20  Temp: 98.2 F (36.8 C)  SpO2: 98%     General: Alert and in no acute distress.   Cardiovascular:  Good peripheral perfusion Respiratory: Normal respiratory effort without tachypnea or retractions. Lungs CTAB. Good air entry to  the bases with no decreased or absent breath sounds. Gastrointestinal: Bowel sounds 4 quadrants. Soft and nontender to palpation. No guarding or rigidity. No palpable masses. No distention. No CVA tenderness. Musculoskeletal: Full range of motion to all extremities.  Patient has tenderness in the lumbar spine specifically L3-S1 region.  There is no palpable abnormality or step-off.  Slightly worse on the left side than right.  Tenderness extending towards the SI joint bilaterally little bit worse on the left than right.  Sensation and pulses intact bilateral lower extremities. Neurologic:  No gross focal neurologic deficits are appreciated.  Skin:   No rash noted Other:   ED Results / Procedures / Treatments   Labs (all labs ordered are listed, but only abnormal results are displayed) Labs Reviewed  POC URINE PREG, ED     EKG     RADIOLOGY  I personally viewed, evaluated, and interpreted these images as part of my medical decision making, as well as reviewing the written report by the radiologist.  ED Provider Interpretation: CT scan reveals disc bulge at L5-S1 with HNP with likely  contact of the S1 nerve root.  Mild disc bulge at L4-L5.  CT Lumbar Spine Wo Contrast  Result Date: 03/10/2023 CLINICAL DATA:  Left low back pain radiating to left groin. EXAM: CT LUMBAR SPINE WITHOUT CONTRAST TECHNIQUE: Multidetector CT imaging of the lumbar spine was performed without intravenous contrast administration. Multiplanar CT image reconstructions were also generated. RADIATION DOSE REDUCTION: This exam was performed according to the departmental dose-optimization program which includes automated exposure control, adjustment of the mA and/or kV according to patient size and/or use of iterative reconstruction technique. COMPARISON:  MRI 01/26/2021 FINDINGS: Segmentation: 5 lumbar type vertebrae. Alignment: Normal. Vertebrae: No acute fracture or focal pathologic process. Paraspinal and other soft  tissues: Negative Disc levels: Diffuse broad-based disc bulge at L4-5. Central HNP at L5-S1, in close approximation with the left S1 nerve root. Findings stable since prior MRI. IMPRESSION: Central disc protrusion noted at L5-S1, closely associated with the left S1 nerve root Mild broad-based disc bulge at L4-5. Findings stable since prior MRI. No acute bony abnormality. Electronically Signed   By: Charlett Nose M.D.   On: 03/10/2023 21:20    PROCEDURES:  Critical Care performed: No  Procedures   MEDICATIONS ORDERED IN ED: Medications  ketorolac (TORADOL) 30 MG/ML injection 30 mg (has no administration in time range)  dexamethasone (DECADRON) injection 10 mg (has no administration in time range)  methocarbamol (ROBAXIN) tablet 1,000 mg (has no administration in time range)  HYDROcodone-acetaminophen (NORCO/VICODIN) 5-325 MG per tablet 1 tablet (has no administration in time range)  diazepam (VALIUM) tablet 2 mg (2 mg Oral Given 03/10/23 2123)  HYDROcodone-acetaminophen (NORCO/VICODIN) 5-325 MG per tablet 1 tablet (1 tablet Oral Given 03/10/23 2123)  ondansetron (ZOFRAN-ODT) disintegrating tablet 8 mg (8 mg Oral Given 03/10/23 2054)     IMPRESSION / MDM / ASSESSMENT AND PLAN / ED COURSE  I reviewed the triage vital signs and the nursing notes.                                 Differential diagnosis includes, but is not limited to, lumbago, cauda equina, sciatica, herniated disc, bulging disc   Patient's presentation is most consistent with acute presentation with potential threat to life or bodily function.   Patient's diagnosis is consistent with bulging disc, sciatica.  Patient presents to the emergency department with increased back pain.  She has been dealing with off-and-on issues for the last 2 years.  Patient had a worsening in July and has had ongoing symptoms.  She has had an SI joint injection from her orthopedist 2 weeks ago with no improvement.  Back pain is worsening.  No  concerning neurodeficits.  At this time ordered CT scan for evaluation of the spine.  There is bulging disc, or with likely contact of the S1 nerve root.  This is consistent with patient's reported symptoms.  I will place the patient on steroids, anti-inflammatory, muscle relaxer and pain medication.  At this time I will refer the patient to neurosurgery.  Concerning signs and symptoms and immediate return precautions discussed with the patient..  Patient is given ED precautions to return to the ED for any worsening or new symptoms.     FINAL CLINICAL IMPRESSION(S) / ED DIAGNOSES   Final diagnoses:  Herniated lumbar intervertebral disc  Acute midline low back pain with bilateral sciatica     Rx / DC Orders   ED Discharge Orders  Ordered    predniSONE (DELTASONE) 50 MG tablet  Daily with breakfast        03/10/23 2310    meloxicam (MOBIC) 15 MG tablet  Daily        03/10/23 2310    methocarbamol (ROBAXIN) 500 MG tablet  4 times daily        03/10/23 2310    HYDROcodone-acetaminophen (NORCO/VICODIN) 5-325 MG tablet  Every 4 hours PRN        03/10/23 2310    ondansetron (ZOFRAN-ODT) 4 MG disintegrating tablet  Every 8 hours PRN        03/10/23 2310             Note:  This document was prepared using Dragon voice recognition software and may include unintentional dictation errors.   Racheal Patches, PA-C 03/10/23 2310    Willy Eddy, MD 03/11/23 1058

## 2023-03-12 ENCOUNTER — Other Ambulatory Visit: Payer: Self-pay

## 2023-03-12 DIAGNOSIS — M5416 Radiculopathy, lumbar region: Secondary | ICD-10-CM

## 2023-03-12 NOTE — Progress Notes (Unsigned)
Referring Physician:  No referring provider defined for this encounter.  Primary Physician:  Debera Lat, PA-C  History of Present Illness: 03/12/2023 Regina Gray is here today with a chief complaint of ***  Left side low back pain radiating bilaterally down legs   Duration: 4 months Location: *** Quality: *** Severity: *** ? Precipitating: aggravated by ***? Modifying factors: made better by ***? Weakness: none Timing: constant Bowel/Bladder Dysfunction: none  Conservative measures:  Physical therapy: currently in PT at [facility]  Multimodal medical therapy including regular antiinflammatories: Prednisone, Meloxicam, Robaxin, Hydrocodone  Injections: no epidural steroid injections  Past Surgery: none  [name] has ***no symptoms of cervical myelopathy.  The symptoms are causing a significant impact on the patient's life.   I have utilized the care everywhere function in epic to review the outside records available from external health systems.  Review of Systems:  A 10 point review of systems is negative, except for the pertinent positives and negatives detailed in the HPI.  Past Medical History: Past Medical History:  Diagnosis Date   Complication of anesthesia    woke up during surgery    Past Surgical History: Past Surgical History:  Procedure Laterality Date   ANTERIOR CRUCIATE LIGAMENT REPAIR     APPENDECTOMY     WISDOM TOOTH EXTRACTION      Allergies: Allergies as of 03/15/2023 - Review Complete 03/10/2023  Allergen Reaction Noted   Codeine  11/20/2016   Hydromorphone  07/06/2015   Oxycodone  07/06/2015   Sulfa antibiotics  11/20/2016    Medications:  Current Outpatient Medications:    acetaminophen (TYLENOL) 500 MG tablet, Take 2 tablets (1,000 mg total) by mouth every 6 (six) hours as needed for fever or headache., Disp: 30 tablet, Rfl: 0   famotidine (PEPCID) 20 MG tablet, Take 1 tablet (20 mg total) by mouth  daily., Disp: 30 tablet, Rfl: 2   HYDROcodone-acetaminophen (NORCO/VICODIN) 5-325 MG tablet, Take 1 tablet by mouth every 4 (four) hours as needed for moderate pain (pain score 4-6)., Disp: 20 tablet, Rfl: 0   LORazepam (ATIVAN) 1 MG tablet, Take 1 tablet (1 mg total) by mouth at bedtime as needed for anxiety., Disp: 8 tablet, Rfl: 0   Magnesium 500 MG CAPS, Take by mouth., Disp: , Rfl:    meloxicam (MOBIC) 15 MG tablet, Take 1 tablet (15 mg total) by mouth daily., Disp: 30 tablet, Rfl: 0   methocarbamol (ROBAXIN) 500 MG tablet, Take 1 tablet (500 mg total) by mouth 4 (four) times daily., Disp: 30 tablet, Rfl: 1   ondansetron (ZOFRAN-ODT) 4 MG disintegrating tablet, Take 1 tablet (4 mg total) by mouth every 8 (eight) hours as needed., Disp: 20 tablet, Rfl: 0   predniSONE (DELTASONE) 50 MG tablet, Take 1 tablet (50 mg total) by mouth daily with breakfast., Disp: 5 tablet, Rfl: 0   sertraline (ZOLOFT) 100 MG tablet, TAKE 2 TABLETS(200 MG) BY MOUTH DAILY, Disp: 60 tablet, Rfl: 5  Social History: Social History   Tobacco Use   Smoking status: Never   Smokeless tobacco: Never  Vaping Use   Vaping status: Never Used  Substance Use Topics   Alcohol use: Not Currently   Drug use: No    Family Medical History: Family History  Problem Relation Age of Onset   Heart attack Maternal Grandfather    Healthy Mother    Healthy Father    Breast cancer Neg Hx    Ovarian cancer Neg Hx  Colon cancer Neg Hx    Diabetes Neg Hx     Physical Examination: There were no vitals filed for this visit.  General: Patient is in no apparent distress. Attention to examination is appropriate.  Neck:   Supple.  Full range of motion.  Respiratory: Patient is breathing without any difficulty.   NEUROLOGICAL:     Awake, alert, oriented to person, place, and time.  Speech is clear and fluent.   Cranial Nerves: Pupils equal round and reactive to light.  Facial tone is symmetric.  Facial sensation is  symmetric. Shoulder shrug is symmetric. Tongue protrusion is midline.  There is no pronator drift.  Strength: Side Biceps Triceps Deltoid Interossei Grip Wrist Ext. Wrist Flex.  R 5 5 5 5 5 5 5   L 5 5 5 5 5 5 5    Side Iliopsoas Quads Hamstring PF DF EHL  R 5 5 5 5 5 5   L 5 5 5 5 5 5    Reflexes are ***2+ and symmetric at the biceps, triceps, brachioradialis, patella and achilles.   Hoffman's is absent.   Bilateral upper and lower extremity sensation is intact to light touch.    No evidence of dysmetria noted.  Gait is normal.     Medical Decision Making  Imaging: ***  I have personally reviewed the images and agree with the above interpretation.  Assessment and Plan: [name] is a pleasant 32 y.o. female with ***    Thank you for involving me in the care of this patient.      Narcissa Melder K. Myer Haff MD, [facility]

## 2023-03-13 NOTE — Progress Notes (Deleted)
  [name] [address] [place] [address] Phone: [phone] Subjective:    I'm seeing this patient by the request  of:  Debera Lat, PA-C  CC:   [id number]  Regina Gray is a 32 y.o. female coming in with complaint of back and neck pain Patient states   Medications patient has been prescribed: None  Taking:         Reviewed prior external information including notes and imaging from previsou exam, outside providers and external EMR if available.   As well as notes that were available from care everywhere and other healthcare systems.  Past medical history, social, surgical and family history all reviewed in electronic medical record.  No pertanent information unless stated regarding to the chief complaint.   Past Medical History:  Diagnosis Date   Complication of anesthesia    woke up during surgery    Allergies  Allergen Reactions   Codeine    Hydromorphone     Other reaction(s): Vomiting   Oxycodone     Other reaction(s): Vomiting   Sulfa Antibiotics      Review of Systems:  No headache, visual changes, nausea, vomiting, diarrhea, constipation, dizziness, abdominal pain, skin rash, fevers, chills, night sweats, weight loss, swollen lymph nodes, body aches, joint swelling, chest pain, shortness of breath, mood changes. POSITIVE muscle aches  Objective  currently breastfeeding.   General: No apparent distress alert and oriented x3 mood and affect normal, dressed appropriately.  HEENT: Pupils equal, extraocular movements intact  Respiratory: Patient's speak in full sentences and does not appear short of breath  Cardiovascular: No lower extremity edema, non tender, no erythema  Gait MSK:  Back   Osteopathic findings  C2 flexed rotated and side bent right C6 flexed rotated and side bent left T3 extended rotated and side bent right inhaled rib T9 extended rotated and side bent left L2 flexed rotated  and side bent right Sacrum right on right       Assessment and Plan:  No problem-specific Assessment & Plan notes found for this encounter.    Nonallopathic problems  Decision today to treat with OMT was based on Physical Exam  After verbal consent patient was treated with HVLA, ME, FPR techniques in cervical, rib, thoracic, lumbar, and sacral  areas  Patient tolerated the procedure well with improvement in symptoms  Patient given exercises, stretches and lifestyle modifications  See medications in patient instructions if given  Patient will follow up in 4-8 weeks             Note: This dictation was prepared with Dragon dictation along with smaller phrase technology. Any transcriptional errors that result from this process are unintentional.

## 2023-03-14 ENCOUNTER — Ambulatory Visit: Payer: Commercial Managed Care - PPO | Admitting: Family Medicine

## 2023-03-15 ENCOUNTER — Encounter: Payer: Self-pay | Admitting: Neurosurgery

## 2023-03-15 ENCOUNTER — Ambulatory Visit (INDEPENDENT_AMBULATORY_CARE_PROVIDER_SITE_OTHER): Payer: Commercial Managed Care - PPO | Admitting: Neurosurgery

## 2023-03-15 VITALS — BP 128/82 | Ht 69.0 in | Wt 182.0 lb

## 2023-03-15 DIAGNOSIS — M5416 Radiculopathy, lumbar region: Secondary | ICD-10-CM | POA: Diagnosis not present

## 2023-04-16 ENCOUNTER — Ambulatory Visit: Payer: Commercial Managed Care - PPO | Attending: Neurosurgery

## 2023-04-16 DIAGNOSIS — G8929 Other chronic pain: Secondary | ICD-10-CM | POA: Diagnosis present

## 2023-04-16 DIAGNOSIS — M533 Sacrococcygeal disorders, not elsewhere classified: Secondary | ICD-10-CM | POA: Insufficient documentation

## 2023-04-16 DIAGNOSIS — M545 Low back pain, unspecified: Secondary | ICD-10-CM | POA: Diagnosis present

## 2023-04-16 DIAGNOSIS — R262 Difficulty in walking, not elsewhere classified: Secondary | ICD-10-CM | POA: Diagnosis present

## 2023-04-16 DIAGNOSIS — M5416 Radiculopathy, lumbar region: Secondary | ICD-10-CM | POA: Diagnosis not present
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
## 2023-04-19 ENCOUNTER — Encounter: Payer: Self-pay | Admitting: Physical Therapy

## 2023-04-19 ENCOUNTER — Telehealth: Payer: Self-pay

## 2023-04-19 ENCOUNTER — Ambulatory Visit: Payer: Commercial Managed Care - PPO | Admitting: Physical Therapy

## 2023-04-19 DIAGNOSIS — M545 Low back pain, unspecified: Secondary | ICD-10-CM | POA: Diagnosis not present

## 2023-04-19 DIAGNOSIS — G8929 Other chronic pain: Secondary | ICD-10-CM

## 2023-04-19 NOTE — Telephone Encounter (Signed)
Author called pt a few hours after clinic visit simply to confirm number and preference to send home exercise assignment via text link to medbridge. Pt confirms ok.   8:45 AM, 04/19/23 Rosamaria Lints, PT, DPT Physical Therapist - [place] [phone] (Office)
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
## 2023-04-24 ENCOUNTER — Telehealth: Payer: Self-pay | Admitting: Physical Therapy

## 2023-04-24 ENCOUNTER — Ambulatory Visit: Payer: Commercial Managed Care - PPO | Admitting: Physical Therapy

## 2023-04-24 NOTE — Telephone Encounter (Signed)
 Spoke with patient and notified her of missed PT visit scheduled at 9:45 today. Reminded patient know that with any no-show I am required to review our cancellation policy that after 2 no-shows we cannot schedule more than 1 week at a time. She apologized and said she assumed we were closed on 12/31. She did not have the appointment on her calendar. She confirmed her next appointment on 1/7 at 9am and said if she does not show up again it is because she did not know she had an appointment and she would like to us  to call her early on since we don't have a reminder text.   Camie SAUNDERS. Juli, PT, DPT 04/24/23, 10:20 AM  [facility] [address] [place], [place] [address] P: [phone] I F: [phone]

## 2023-05-01 ENCOUNTER — Ambulatory Visit: Payer: Commercial Managed Care - PPO | Attending: Neurosurgery | Admitting: Physical Therapy

## 2023-05-01 DIAGNOSIS — G8929 Other chronic pain: Secondary | ICD-10-CM | POA: Diagnosis present

## 2023-05-01 DIAGNOSIS — M545 Low back pain, unspecified: Secondary | ICD-10-CM | POA: Insufficient documentation
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
## 2023-05-02 ENCOUNTER — Encounter: Payer: Self-pay | Admitting: Physical Therapy

## 2023-05-03 ENCOUNTER — Encounter: Payer: Commercial Managed Care - PPO | Admitting: Physical Therapy

## 2023-05-08 ENCOUNTER — Encounter: Payer: Self-pay | Admitting: Physical Therapy

## 2023-05-08 ENCOUNTER — Ambulatory Visit: Payer: Commercial Managed Care - PPO | Admitting: Physical Therapy

## 2023-05-08 DIAGNOSIS — M545 Low back pain, unspecified: Secondary | ICD-10-CM
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
## 2023-05-10 ENCOUNTER — Encounter: Payer: Self-pay | Admitting: Physical Therapy

## 2023-05-10 ENCOUNTER — Ambulatory Visit: Payer: Commercial Managed Care - PPO | Admitting: Neurosurgery

## 2023-05-10 ENCOUNTER — Encounter: Payer: Self-pay | Admitting: Neurosurgery

## 2023-05-10 ENCOUNTER — Ambulatory Visit: Payer: Commercial Managed Care - PPO | Admitting: Physical Therapy

## 2023-05-10 VITALS — BP 118/76

## 2023-05-10 DIAGNOSIS — G8929 Other chronic pain: Secondary | ICD-10-CM

## 2023-05-10 DIAGNOSIS — M5116 Intervertebral disc disorders with radiculopathy, lumbar region: Secondary | ICD-10-CM | POA: Diagnosis not present

## 2023-05-10 DIAGNOSIS — M545 Low back pain, unspecified: Secondary | ICD-10-CM | POA: Diagnosis not present

## 2023-05-10 NOTE — Progress Notes (Signed)
Referring Physician:  Debera Lat, PA-C [address] [place],  [place] [address]  Primary Physician:  Debera Lat, PA-C  History of Present Illness: 05/10/2023 She has been doing PT and seeing a chiropractor.  She is feeling much better.  She has had some tingling into her right foot near her metatarsals that spread along the knuckles of her feet.  This is not currently happening, but did happen over the past week or so.  She has been doing chiropractor weekly or twice a week.  She has been doing physical therapy which has helped a lot.  Her chronic pain is much improved.   03/15/2023 Regina Gray is here today with a chief complaint of back and left leg discomfort around her buttock.  Her back pain is her primary issue, and has been an issue for some time.  She has had episodic pain in her back that extends towards her left hip.  She is also recently had pain into her left buttock.  This has been worsening since July.  She reports stiffness in her back and pain as bad as 4 out of 10.  She has been started on steroids which have decreased her pain.  Walking and standing make her pain worse.  Bending makes her pain worse.  She denies numbness or tingling.  She denies bowel or bladder dysfunction.  Conservative measures: seen a chiropractor Physical therapy: no    Multimodal medical therapy including regular antiinflammatories: Prednisone, Meloxicam, Robaxin, Hydrocodone  Injections: no epidural steroid injections 02/20/23:   Past Surgery: none  Regina Gray has no symptoms of cervical myelopathy.  The symptoms are causing a significant impact on the patient's life.   I have utilized the care everywhere function in epic to review the outside records available from external health systems.  Review of Systems:  A 10 point review of systems is negative, except for the pertinent positives and negatives detailed in the HPI.  Past Medical History: Past  Medical History:  Diagnosis Date   Complication of anesthesia    woke up during surgery    Past Surgical History: Past Surgical History:  Procedure Laterality Date   ANTERIOR CRUCIATE LIGAMENT REPAIR     APPENDECTOMY     WISDOM TOOTH EXTRACTION      Allergies: Allergies as of 05/10/2023 - Review Complete 05/10/2023  Allergen Reaction Noted   Codeine  11/20/2016   Hydromorphone  07/06/2015   Oxycodone  07/06/2015   Sulfa antibiotics  11/20/2016    Medications:  Current Outpatient Medications:    HYDROcodone-acetaminophen (NORCO/VICODIN) 5-325 MG tablet, Take 1 tablet by mouth every 4 (four) hours as needed for moderate pain (pain score 4-6). (Patient not taking: Reported on 05/10/2023), Disp: 20 tablet, Rfl: 0   meloxicam (MOBIC) 15 MG tablet, Take 1 tablet (15 mg total) by mouth daily. (Patient not taking: Reported on 05/10/2023), Disp: 30 tablet, Rfl: 0   methocarbamol (ROBAXIN) 500 MG tablet, Take 1 tablet (500 mg total) by mouth 4 (four) times daily. (Patient not taking: Reported on 05/10/2023), Disp: 30 tablet, Rfl: 1   ondansetron (ZOFRAN-ODT) 4 MG disintegrating tablet, Take 1 tablet (4 mg total) by mouth every 8 (eight) hours as needed. (Patient not taking: Reported on 05/10/2023), Disp: 20 tablet, Rfl: 0   predniSONE (DELTASONE) 50 MG tablet, Take 1 tablet (50 mg total) by mouth daily with breakfast. (Patient not taking: Reported on 05/10/2023), Disp: 5 tablet, Rfl: 0  Social History: Social History  Tobacco Use   Smoking status: Never   Smokeless tobacco: Never  Vaping Use   Vaping status: Never Used  Substance Use Topics   Alcohol use: Not Currently   Drug use: No    Family Medical History: Family History  Problem Relation Age of Onset   Heart attack Maternal Grandfather    Healthy Mother    Healthy Father    Breast cancer Neg Hx    Ovarian cancer Neg Hx    Colon cancer Neg Hx    Diabetes Neg Hx     Physical Examination: Vitals:   05/10/23 1544  BP:  118/76    General: Patient is in no apparent distress. Attention to examination is appropriate.  Neck:   Supple.  Full range of motion.  Respiratory: Patient is breathing without any difficulty.   NEUROLOGICAL:     Awake, alert, oriented to person, place, and time.  Speech is clear and fluent.   Cranial Nerves: Pupils equal round and reactive to light.  Facial tone is symmetric.  Facial sensation is symmetric. Shoulder shrug is symmetric. Tongue protrusion is midline.  There is no pronator drift.  Strength: Side Biceps Triceps Deltoid Interossei Grip Wrist Ext. Wrist Flex.  R 5 5 5 5 5 5 5   L 5 5 5 5 5 5 5    Side Iliopsoas Quads Hamstring PF DF EHL  R 5 5 5 5 5 5   L 5 5 5 5 5 5    Reflexes are 1+ and symmetric at the biceps, triceps, brachioradialis, patella and achilles.   Hoffman's is absent.   Bilateral upper and lower extremity sensation is intact to light touch.    No evidence of dysmetria noted.  Gait is normal.     Medical Decision Making  Imaging: CT L spine 03/10/2023  IMPRESSION: Central disc protrusion noted at L5-S1, closely associated with the left S1 nerve root   Mild broad-based disc bulge at L4-5.   Findings stable since prior MRI.   No acute bony abnormality.     Electronically Signed   By: Charlett Nose M.D.   On: 03/10/2023 21:20  I have personally reviewed the images and agree with the above interpretation.  Assessment and Plan: [name] is a pleasant 33 y.o. 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    She is feeling much better.  She may be recovering from her radiculopathy.  At this point, she will continue physical therapy.  I think she may benefit from a home exercise program to incorporate into her exercise plan moving forward.  We discussed the components to help with her long-term back health including core exercise  and physical activity.  I will see her back on an as-needed basis.  I am happy to reorder physical therapy if she would like to continue.  I spent a total of 15 minutes in this patient's care today. This time was spent reviewing pertinent records including imaging studies, obtaining and confirming history, performing a directed evaluation, formulating and discussing my recommendations, and documenting the visit within the medical record.      Thank you for involving me in the care of this patient.      Kazia Grisanti K. Myer Haff MD, [facility]
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
## 2023-05-16 ENCOUNTER — Encounter: Payer: Commercial Managed Care - PPO | Admitting: Physical Therapy

## 2023-05-17 ENCOUNTER — Encounter: Payer: Self-pay | Admitting: Physical Therapy

## 2023-05-17 ENCOUNTER — Ambulatory Visit: Payer: Commercial Managed Care - PPO | Admitting: Physical Therapy

## 2023-05-17 DIAGNOSIS — M545 Low back pain, unspecified: Secondary | ICD-10-CM | POA: Diagnosis not present

## 2023-05-17 DIAGNOSIS — G8929 Other chronic pain: Secondary | ICD-10-CM
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
## 2023-05-21 ENCOUNTER — Encounter: Payer: Commercial Managed Care - PPO | Admitting: Physical Therapy

## 2023-05-23 ENCOUNTER — Encounter: Payer: Self-pay | Admitting: Physical Therapy

## 2023-05-23 ENCOUNTER — Ambulatory Visit: Payer: Commercial Managed Care - PPO | Admitting: Physical Therapy

## 2023-05-23 DIAGNOSIS — M545 Low back pain, unspecified: Secondary | ICD-10-CM
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
## 2023-05-28 ENCOUNTER — Encounter: Payer: Commercial Managed Care - PPO | Admitting: Physical Therapy

## 2023-05-28 ENCOUNTER — Encounter: Payer: Self-pay | Admitting: Physical Therapy

## 2023-05-28 ENCOUNTER — Ambulatory Visit: Payer: Commercial Managed Care - PPO | Attending: Neurosurgery | Admitting: Physical Therapy

## 2023-05-28 DIAGNOSIS — M545 Low back pain, unspecified: Secondary | ICD-10-CM | POA: Insufficient documentation

## 2023-05-28 DIAGNOSIS — G8929 Other chronic pain: Secondary | ICD-10-CM | POA: Insufficient documentation
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
## 2023-05-28 NOTE — Patient Instructions (Signed)
Education on HEP to start utilizing gym and start integrating into participation in long term exercise goals.  Plan: days not at PT (30 min at [employer] after dropping off kids and mom is watching baby). Prone press ups 1x at least 10 Hip airplanes (if not sore from the day before): 1-2x10 Dead lifts: 2-3x10 with empty bar (45#) elevated to distance from floor where back is not rounded or to replicate what it would be like with bumper plates).  Prone press up? (1x10) Single leg hamstring eccentrics with slider/sock/paper plate: 1-6X09 each side Prone press up: 1x10 **optional: 2-3x10 squats with challenging weight (30-40#?) if desired and feeling good. (Follow with prone press ups if added at end, or work into session before ending with prone press ups)**

## 2023-05-30 ENCOUNTER — Encounter: Payer: Self-pay | Admitting: Physical Therapy

## 2023-05-30 ENCOUNTER — Ambulatory Visit: Payer: Commercial Managed Care - PPO | Admitting: Physical Therapy

## 2023-05-30 ENCOUNTER — Encounter: Payer: Commercial Managed Care - PPO | Admitting: Physical Therapy

## 2023-05-30 DIAGNOSIS — G8929 Other chronic pain: Secondary | ICD-10-CM

## 2023-05-30 DIAGNOSIS — M545 Low back pain, unspecified: Secondary | ICD-10-CM | POA: Diagnosis not present
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
## 2023-06-04 ENCOUNTER — Ambulatory Visit: Payer: Commercial Managed Care - PPO | Admitting: Physical Therapy

## 2023-06-04 ENCOUNTER — Encounter: Payer: Commercial Managed Care - PPO | Admitting: Physical Therapy

## 2023-06-06 ENCOUNTER — Ambulatory Visit: Payer: Commercial Managed Care - PPO | Admitting: Physical Therapy

## 2023-06-06 ENCOUNTER — Encounter: Payer: Commercial Managed Care - PPO | Admitting: Physical Therapy

## 2023-06-11 ENCOUNTER — Encounter: Payer: Commercial Managed Care - PPO | Admitting: Physical Therapy

## 2023-06-13 ENCOUNTER — Ambulatory Visit: Payer: Commercial Managed Care - PPO | Admitting: Physical Therapy

## 2023-06-13 DIAGNOSIS — M545 Low back pain, unspecified: Secondary | ICD-10-CM
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
## 2023-06-21 ENCOUNTER — Encounter: Payer: Self-pay | Admitting: Physical Therapy

## 2023-06-21 ENCOUNTER — Ambulatory Visit: Payer: Commercial Managed Care - PPO | Admitting: Physical Therapy

## 2023-06-21 DIAGNOSIS — M545 Low back pain, unspecified: Secondary | ICD-10-CM | POA: Diagnosis not present
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
## 2023-06-27 ENCOUNTER — Encounter: Payer: Self-pay | Admitting: Physical Therapy

## 2023-06-27 ENCOUNTER — Ambulatory Visit: Payer: Commercial Managed Care - PPO | Attending: Neurosurgery | Admitting: Physical Therapy

## 2023-06-27 DIAGNOSIS — M545 Low back pain, unspecified: Secondary | ICD-10-CM | POA: Diagnosis present

## 2023-06-27 DIAGNOSIS — G8929 Other chronic pain: Secondary | ICD-10-CM | POA: Insufficient documentation

## 2023-06-27 DIAGNOSIS — M25551 Pain in right hip: Secondary | ICD-10-CM | POA: Insufficient documentation

## 2023-06-27 DIAGNOSIS — M533 Sacrococcygeal disorders, not elsewhere classified: Secondary | ICD-10-CM | POA: Insufficient documentation

## 2023-06-27 DIAGNOSIS — R262 Difficulty in walking, not elsewhere classified: Secondary | ICD-10-CM | POA: Insufficient documentation
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
## 2023-07-03 ENCOUNTER — Ambulatory Visit: Payer: Commercial Managed Care - PPO

## 2023-07-03 DIAGNOSIS — M25551 Pain in right hip: Secondary | ICD-10-CM

## 2023-07-03 DIAGNOSIS — M533 Sacrococcygeal disorders, not elsewhere classified: Secondary | ICD-10-CM

## 2023-07-03 DIAGNOSIS — M545 Low back pain, unspecified: Secondary | ICD-10-CM | POA: Diagnosis not present

## 2023-07-03 DIAGNOSIS — G8929 Other chronic pain: Secondary | ICD-10-CM

## 2023-07-03 DIAGNOSIS — R262 Difficulty in walking, not elsewhere classified: Secondary | ICD-10-CM
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
## 2023-07-10 ENCOUNTER — Ambulatory Visit: Payer: Commercial Managed Care - PPO | Admitting: Physical Therapy

## 2023-07-10 ENCOUNTER — Encounter: Payer: Self-pay | Admitting: Physical Therapy

## 2023-07-10 DIAGNOSIS — G8929 Other chronic pain: Secondary | ICD-10-CM

## 2023-07-10 DIAGNOSIS — M545 Low back pain, unspecified: Secondary | ICD-10-CM | POA: Diagnosis not present
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
## 2023-07-12 ENCOUNTER — Encounter: Payer: Self-pay | Admitting: Neurosurgery

## 2023-07-16 ENCOUNTER — Encounter: Payer: Self-pay | Admitting: Physical Therapy

## 2023-07-16 ENCOUNTER — Ambulatory Visit: Payer: Commercial Managed Care - PPO | Admitting: Physical Therapy

## 2023-07-16 DIAGNOSIS — M545 Low back pain, unspecified: Secondary | ICD-10-CM | POA: Diagnosis not present

## 2023-07-16 DIAGNOSIS — G8929 Other chronic pain: Secondary | ICD-10-CM

## 2023-07-16 NOTE — Patient Instructions (Signed)
 MECHNICAL STRESSORS  Loading the disk (see chart):   Nerve tension: L shape of body (knee straight, head down, toes up)  Low back flexion (at least in upright position, maybe okay when lying)  Try to avoid these for now using the strategies we talked about to let your back calm down.
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
## 2023-07-18 ENCOUNTER — Encounter: Payer: Self-pay | Admitting: Physical Therapy

## 2023-07-18 ENCOUNTER — Ambulatory Visit: Payer: Commercial Managed Care - PPO | Admitting: Physical Therapy

## 2023-07-18 DIAGNOSIS — M545 Low back pain, unspecified: Secondary | ICD-10-CM | POA: Diagnosis not present

## 2023-07-18 DIAGNOSIS — G8929 Other chronic pain: Secondary | ICD-10-CM

## 2023-07-18 NOTE — Patient Instructions (Signed)
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
## 2023-07-23 ENCOUNTER — Ambulatory Visit: Payer: Commercial Managed Care - PPO | Admitting: Physical Therapy

## 2023-07-23 ENCOUNTER — Encounter: Payer: Self-pay | Admitting: Physical Therapy

## 2023-07-23 DIAGNOSIS — M545 Low back pain, unspecified: Secondary | ICD-10-CM | POA: Diagnosis not present

## 2023-07-23 DIAGNOSIS — G8929 Other chronic pain: Secondary | ICD-10-CM
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
## 2023-07-25 ENCOUNTER — Ambulatory Visit: Payer: Commercial Managed Care - PPO | Attending: Neurosurgery | Admitting: Physical Therapy

## 2023-07-25 ENCOUNTER — Encounter: Payer: Self-pay | Admitting: Physical Therapy

## 2023-07-25 DIAGNOSIS — G8929 Other chronic pain: Secondary | ICD-10-CM | POA: Diagnosis present

## 2023-07-25 DIAGNOSIS — M545 Low back pain, unspecified: Secondary | ICD-10-CM | POA: Diagnosis present
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
## 2023-07-26 ENCOUNTER — Encounter: Payer: Self-pay | Admitting: Physical Therapy

## 2023-07-30 ENCOUNTER — Ambulatory Visit: Payer: Commercial Managed Care - PPO | Admitting: Physical Therapy

## 2023-07-30 ENCOUNTER — Encounter: Payer: Self-pay | Admitting: Physical Therapy

## 2023-07-30 DIAGNOSIS — M545 Low back pain, unspecified: Secondary | ICD-10-CM | POA: Diagnosis not present
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
## 2023-08-01 ENCOUNTER — Encounter: Payer: Self-pay | Admitting: Physical Therapy

## 2023-08-01 ENCOUNTER — Ambulatory Visit: Payer: Commercial Managed Care - PPO | Admitting: Physical Therapy

## 2023-08-01 DIAGNOSIS — M545 Low back pain, unspecified: Secondary | ICD-10-CM
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
## 2023-08-06 ENCOUNTER — Ambulatory Visit: Payer: Commercial Managed Care - PPO | Admitting: Physical Therapy

## 2023-08-06 ENCOUNTER — Encounter: Payer: Self-pay | Admitting: Physical Therapy

## 2023-08-06 DIAGNOSIS — M545 Low back pain, unspecified: Secondary | ICD-10-CM
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
## 2023-08-08 ENCOUNTER — Ambulatory Visit: Payer: Commercial Managed Care - PPO | Admitting: Physical Therapy

## 2023-08-08 ENCOUNTER — Encounter: Payer: Self-pay | Admitting: Physical Therapy

## 2023-08-08 DIAGNOSIS — M545 Low back pain, unspecified: Secondary | ICD-10-CM
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
## 2023-08-13 ENCOUNTER — Encounter: Payer: Self-pay | Admitting: Physical Therapy

## 2023-08-13 ENCOUNTER — Ambulatory Visit: Payer: Commercial Managed Care - PPO | Admitting: Physical Therapy

## 2023-08-13 DIAGNOSIS — M545 Low back pain, unspecified: Secondary | ICD-10-CM
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
## 2023-08-15 ENCOUNTER — Ambulatory Visit: Payer: Commercial Managed Care - PPO | Admitting: Physical Therapy

## 2023-08-15 ENCOUNTER — Encounter: Payer: Self-pay | Admitting: Physical Therapy

## 2023-08-15 DIAGNOSIS — G8929 Other chronic pain: Secondary | ICD-10-CM

## 2023-08-15 DIAGNOSIS — M545 Low back pain, unspecified: Secondary | ICD-10-CM | POA: Diagnosis not present
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
## 2023-08-20 ENCOUNTER — Ambulatory Visit: Admitting: Physical Therapy

## 2023-08-20 ENCOUNTER — Encounter: Payer: Self-pay | Admitting: Physical Therapy

## 2023-08-20 DIAGNOSIS — M545 Low back pain, unspecified: Secondary | ICD-10-CM | POA: Diagnosis not present
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
## 2023-08-22 ENCOUNTER — Ambulatory Visit: Admitting: Physical Therapy

## 2023-08-22 DIAGNOSIS — M545 Low back pain, unspecified: Secondary | ICD-10-CM | POA: Diagnosis not present

## 2023-08-22 DIAGNOSIS — G8929 Other chronic pain: Secondary | ICD-10-CM
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
## 2023-08-28 ENCOUNTER — Ambulatory Visit: Attending: Neurosurgery | Admitting: Physical Therapy

## 2023-08-28 ENCOUNTER — Encounter: Payer: Self-pay | Admitting: Physical Therapy

## 2023-08-28 DIAGNOSIS — M545 Low back pain, unspecified: Secondary | ICD-10-CM | POA: Insufficient documentation

## 2023-08-28 DIAGNOSIS — R262 Difficulty in walking, not elsewhere classified: Secondary | ICD-10-CM | POA: Insufficient documentation

## 2023-08-28 DIAGNOSIS — M533 Sacrococcygeal disorders, not elsewhere classified: Secondary | ICD-10-CM | POA: Insufficient documentation

## 2023-08-28 DIAGNOSIS — M62830 Muscle spasm of back: Secondary | ICD-10-CM | POA: Insufficient documentation

## 2023-08-28 DIAGNOSIS — G8929 Other chronic pain: Secondary | ICD-10-CM | POA: Insufficient documentation

## 2023-08-28 DIAGNOSIS — M25551 Pain in right hip: Secondary | ICD-10-CM | POA: Insufficient documentation
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
## 2023-08-30 ENCOUNTER — Ambulatory Visit: Admitting: Physical Therapy

## 2023-08-30 ENCOUNTER — Encounter: Payer: Self-pay | Admitting: Physical Therapy

## 2023-08-30 DIAGNOSIS — M545 Low back pain, unspecified: Secondary | ICD-10-CM
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
## 2023-09-04 ENCOUNTER — Ambulatory Visit

## 2023-09-04 ENCOUNTER — Encounter: Payer: Self-pay | Admitting: Physical Therapy

## 2023-09-04 DIAGNOSIS — M545 Low back pain, unspecified: Secondary | ICD-10-CM | POA: Diagnosis not present

## 2023-09-04 DIAGNOSIS — G8929 Other chronic pain: Secondary | ICD-10-CM

## 2023-09-04 DIAGNOSIS — R262 Difficulty in walking, not elsewhere classified: Secondary | ICD-10-CM

## 2023-09-04 DIAGNOSIS — M533 Sacrococcygeal disorders, not elsewhere classified: Secondary | ICD-10-CM

## 2023-09-04 DIAGNOSIS — M25551 Pain in right hip: Secondary | ICD-10-CM
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
## 2023-09-06 ENCOUNTER — Encounter: Payer: Self-pay | Admitting: Physical Therapy

## 2023-09-06 ENCOUNTER — Ambulatory Visit

## 2023-09-06 DIAGNOSIS — M25551 Pain in right hip: Secondary | ICD-10-CM

## 2023-09-06 DIAGNOSIS — M545 Low back pain, unspecified: Secondary | ICD-10-CM

## 2023-09-06 DIAGNOSIS — G8929 Other chronic pain: Secondary | ICD-10-CM

## 2023-09-06 DIAGNOSIS — M62830 Muscle spasm of back: Secondary | ICD-10-CM

## 2023-09-06 DIAGNOSIS — R262 Difficulty in walking, not elsewhere classified: Secondary | ICD-10-CM

## 2023-09-06 DIAGNOSIS — M533 Sacrococcygeal disorders, not elsewhere classified: Secondary | ICD-10-CM
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
## 2023-09-11 ENCOUNTER — Ambulatory Visit: Admitting: Physical Therapy

## 2023-09-13 ENCOUNTER — Encounter: Payer: Self-pay | Admitting: Physical Therapy

## 2023-09-13 ENCOUNTER — Ambulatory Visit: Admitting: Physical Therapy

## 2023-09-13 DIAGNOSIS — M545 Low back pain, unspecified: Secondary | ICD-10-CM | POA: Diagnosis not present
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
## 2023-09-19 ENCOUNTER — Encounter: Payer: Self-pay | Admitting: Physical Therapy

## 2023-09-19 ENCOUNTER — Ambulatory Visit: Admitting: Physical Therapy

## 2023-09-19 DIAGNOSIS — M545 Low back pain, unspecified: Secondary | ICD-10-CM | POA: Diagnosis not present

## 2023-09-19 DIAGNOSIS — G8929 Other chronic pain: Secondary | ICD-10-CM
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
## 2023-09-24 ENCOUNTER — Encounter: Payer: Self-pay | Admitting: Physical Therapy

## 2023-09-24 ENCOUNTER — Ambulatory Visit: Attending: Neurosurgery | Admitting: Physical Therapy

## 2023-09-24 DIAGNOSIS — M545 Low back pain, unspecified: Secondary | ICD-10-CM | POA: Diagnosis present

## 2023-09-24 DIAGNOSIS — G8929 Other chronic pain: Secondary | ICD-10-CM | POA: Insufficient documentation
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
## 2023-09-27 ENCOUNTER — Ambulatory Visit: Admitting: Physical Therapy

## 2023-10-02 ENCOUNTER — Ambulatory Visit: Admitting: Physical Therapy

## 2023-10-02 DIAGNOSIS — G8929 Other chronic pain: Secondary | ICD-10-CM

## 2023-10-02 DIAGNOSIS — M545 Low back pain, unspecified: Secondary | ICD-10-CM | POA: Diagnosis not present
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
## 2023-10-04 ENCOUNTER — Ambulatory Visit: Admitting: Physical Therapy

## 2023-10-04 ENCOUNTER — Encounter: Payer: Self-pay | Admitting: Physical Therapy

## 2023-10-04 DIAGNOSIS — M545 Low back pain, unspecified: Secondary | ICD-10-CM | POA: Diagnosis not present

## 2023-10-04 DIAGNOSIS — G8929 Other chronic pain: Secondary | ICD-10-CM
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
## 2023-10-09 ENCOUNTER — Ambulatory Visit: Admitting: Physical Therapy

## 2023-10-09 ENCOUNTER — Encounter: Payer: Self-pay | Admitting: Physical Therapy

## 2023-10-09 DIAGNOSIS — M545 Low back pain, unspecified: Secondary | ICD-10-CM
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
## 2023-10-16 ENCOUNTER — Ambulatory Visit: Admitting: Physical Therapy

## 2023-10-16 ENCOUNTER — Encounter: Payer: Self-pay | Admitting: Physical Therapy

## 2023-10-16 DIAGNOSIS — G8929 Other chronic pain: Secondary | ICD-10-CM

## 2023-10-16 DIAGNOSIS — M545 Low back pain, unspecified: Secondary | ICD-10-CM | POA: Diagnosis not present
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
## 2023-10-18 ENCOUNTER — Ambulatory Visit: Admitting: Physical Therapy

## 2023-10-18 ENCOUNTER — Encounter: Payer: Self-pay | Admitting: Physical Therapy

## 2023-10-18 DIAGNOSIS — M545 Low back pain, unspecified: Secondary | ICD-10-CM
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
## 2023-10-23 ENCOUNTER — Ambulatory Visit: Attending: Neurosurgery | Admitting: Physical Therapy

## 2023-10-23 ENCOUNTER — Encounter: Payer: Self-pay | Admitting: Physical Therapy

## 2023-10-23 DIAGNOSIS — G8929 Other chronic pain: Secondary | ICD-10-CM | POA: Diagnosis present

## 2023-10-23 DIAGNOSIS — M545 Low back pain, unspecified: Secondary | ICD-10-CM | POA: Diagnosis present
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
## 2023-10-29 ENCOUNTER — Ambulatory Visit: Admitting: Physical Therapy

## 2023-10-31 ENCOUNTER — Ambulatory Visit: Admitting: Physical Therapy

## 2023-11-05 ENCOUNTER — Encounter: Admitting: Physical Therapy

## 2023-11-08 ENCOUNTER — Encounter: Admitting: Physical Therapy

## 2023-11-12 ENCOUNTER — Encounter: Admitting: Physical Therapy

## 2023-11-14 ENCOUNTER — Encounter: Admitting: Physical Therapy

## 2023-11-21 ENCOUNTER — Encounter: Admitting: Physical Therapy
